# Patient Record
Sex: Male | Born: 1945 | Race: Black or African American | Hispanic: No | Marital: Married | Smoking: Current every day smoker
Health system: Southern US, Community
[De-identification: ages and names within clinical notes are randomized; demographics above are authoritative.]

## PROBLEM LIST (undated history)

## (undated) DIAGNOSIS — E119 Type 2 diabetes mellitus without complications: Secondary | ICD-10-CM

## (undated) DIAGNOSIS — I10 Essential (primary) hypertension: Secondary | ICD-10-CM

## (undated) DIAGNOSIS — F0390 Unspecified dementia without behavioral disturbance: Secondary | ICD-10-CM

## (undated) DIAGNOSIS — Z77098 Contact with and (suspected) exposure to other hazardous, chiefly nonmedicinal, chemicals: Secondary | ICD-10-CM

## (undated) HISTORY — PX: LUNG REMOVAL, PARTIAL: SHX233

## (undated) HISTORY — PX: TOE AMPUTATION: SHX809

---

## 2021-04-05 ENCOUNTER — Inpatient Hospital Stay (HOSPITAL_COMMUNITY)
Admission: EM | Admit: 2021-04-05 | Discharge: 2021-04-08 | DRG: 064 | Disposition: A | Discharge status: Home Health | Payer: No Typology Code available for payment source | Attending: Family Medicine | Admitting: Family Medicine

## 2021-04-05 ENCOUNTER — Emergency Department (HOSPITAL_COMMUNITY): Payer: No Typology Code available for payment source

## 2021-04-05 ENCOUNTER — Encounter (HOSPITAL_COMMUNITY): Payer: Self-pay

## 2021-04-05 ENCOUNTER — Other Ambulatory Visit: Payer: Self-pay

## 2021-04-05 DIAGNOSIS — E1165 Type 2 diabetes mellitus with hyperglycemia: Secondary | ICD-10-CM | POA: Diagnosis present

## 2021-04-05 DIAGNOSIS — IMO0002 Reserved for concepts with insufficient information to code with codable children: Secondary | ICD-10-CM | POA: Diagnosis present

## 2021-04-05 DIAGNOSIS — F1011 Alcohol abuse, in remission: Secondary | ICD-10-CM | POA: Diagnosis present

## 2021-04-05 DIAGNOSIS — Z8249 Family history of ischemic heart disease and other diseases of the circulatory system: Secondary | ICD-10-CM

## 2021-04-05 DIAGNOSIS — I10 Essential (primary) hypertension: Secondary | ICD-10-CM | POA: Diagnosis present

## 2021-04-05 DIAGNOSIS — R739 Hyperglycemia, unspecified: Secondary | ICD-10-CM

## 2021-04-05 DIAGNOSIS — I6340 Cerebral infarction due to embolism of unspecified cerebral artery: Principal | ICD-10-CM | POA: Diagnosis present

## 2021-04-05 DIAGNOSIS — F0280 Dementia in other diseases classified elsewhere without behavioral disturbance: Secondary | ICD-10-CM | POA: Diagnosis present

## 2021-04-05 DIAGNOSIS — Z902 Acquired absence of lung [part of]: Secondary | ICD-10-CM

## 2021-04-05 DIAGNOSIS — I513 Intracardiac thrombosis, not elsewhere classified: Secondary | ICD-10-CM | POA: Diagnosis present

## 2021-04-05 DIAGNOSIS — R4182 Altered mental status, unspecified: Secondary | ICD-10-CM

## 2021-04-05 DIAGNOSIS — G9341 Metabolic encephalopathy: Secondary | ICD-10-CM | POA: Diagnosis present

## 2021-04-05 DIAGNOSIS — D649 Anemia, unspecified: Secondary | ICD-10-CM | POA: Diagnosis present

## 2021-04-05 DIAGNOSIS — Z7984 Long term (current) use of oral hypoglycemic drugs: Secondary | ICD-10-CM

## 2021-04-05 DIAGNOSIS — N179 Acute kidney failure, unspecified: Secondary | ICD-10-CM | POA: Diagnosis present

## 2021-04-05 DIAGNOSIS — I639 Cerebral infarction, unspecified: Secondary | ICD-10-CM | POA: Diagnosis present

## 2021-04-05 DIAGNOSIS — N189 Chronic kidney disease, unspecified: Secondary | ICD-10-CM | POA: Diagnosis present

## 2021-04-05 DIAGNOSIS — F1027 Alcohol dependence with alcohol-induced persisting dementia: Secondary | ICD-10-CM | POA: Diagnosis present

## 2021-04-05 DIAGNOSIS — F17210 Nicotine dependence, cigarettes, uncomplicated: Secondary | ICD-10-CM | POA: Diagnosis present

## 2021-04-05 DIAGNOSIS — D696 Thrombocytopenia, unspecified: Secondary | ICD-10-CM | POA: Diagnosis present

## 2021-04-05 DIAGNOSIS — I129 Hypertensive chronic kidney disease with stage 1 through stage 4 chronic kidney disease, or unspecified chronic kidney disease: Secondary | ICD-10-CM | POA: Diagnosis present

## 2021-04-05 DIAGNOSIS — R55 Syncope and collapse: Secondary | ICD-10-CM

## 2021-04-05 DIAGNOSIS — Z20822 Contact with and (suspected) exposure to covid-19: Secondary | ICD-10-CM | POA: Diagnosis present

## 2021-04-05 DIAGNOSIS — Z79899 Other long term (current) drug therapy: Secondary | ICD-10-CM

## 2021-04-05 DIAGNOSIS — Z720 Tobacco use: Secondary | ICD-10-CM | POA: Diagnosis present

## 2021-04-05 DIAGNOSIS — E1122 Type 2 diabetes mellitus with diabetic chronic kidney disease: Secondary | ICD-10-CM | POA: Diagnosis present

## 2021-04-05 DIAGNOSIS — R1310 Dysphagia, unspecified: Secondary | ICD-10-CM | POA: Diagnosis present

## 2021-04-05 DIAGNOSIS — F0390 Unspecified dementia without behavioral disturbance: Secondary | ICD-10-CM | POA: Diagnosis present

## 2021-04-05 HISTORY — DX: Essential (primary) hypertension: I10

## 2021-04-05 HISTORY — DX: Unspecified dementia, unspecified severity, without behavioral disturbance, psychotic disturbance, mood disturbance, and anxiety: F03.90

## 2021-04-05 HISTORY — DX: Type 2 diabetes mellitus without complications: E11.9

## 2021-04-05 HISTORY — DX: Contact with and (suspected) exposure to other hazardous, chiefly nonmedicinal, chemicals: Z77.098

## 2021-04-05 LAB — CBG MONITORING, ED
Glucose-Capillary: 539 mg/dL (ref 70–99)
Glucose-Capillary: 417 mg/dL — ABNORMAL HIGH (ref 70–99)
Glucose-Capillary: 337 mg/dL — ABNORMAL HIGH (ref 70–99)
Glucose-Capillary: 83 mg/dL (ref 70–99)

## 2021-04-05 LAB — CBC
HCT: 34.5 % — ABNORMAL LOW (ref 39.0–52.0)
Hemoglobin: 12 g/dL — ABNORMAL LOW (ref 13.0–17.0)
MCH: 33.5 pg (ref 26.0–34.0)
MCHC: 34.8 g/dL (ref 30.0–36.0)
MCV: 96.4 fL (ref 80.0–100.0)
Platelets: 221 10*3/uL (ref 150–400)
RBC: 3.58 MIL/uL — ABNORMAL LOW (ref 4.22–5.81)
RDW: 12.2 % (ref 11.5–15.5)
WBC: 10.3 10*3/uL (ref 4.0–10.5)
nRBC: 0 % (ref 0.0–0.2)

## 2021-04-05 LAB — COMPREHENSIVE METABOLIC PANEL
ALT: 23 U/L (ref 0–44)
AST: 17 U/L (ref 15–41)
Albumin: 4.4 g/dL (ref 3.5–5.0)
Alkaline Phosphatase: 67 U/L (ref 38–126)
Anion gap: 14 (ref 5–15)
BUN: 31 mg/dL — ABNORMAL HIGH (ref 8–23)
CO2: 25 mmol/L (ref 22–32)
Calcium: 9.7 mg/dL (ref 8.9–10.3)
Chloride: 94 mmol/L — ABNORMAL LOW (ref 98–111)
Creatinine, Ser: 2.19 mg/dL — ABNORMAL HIGH (ref 0.61–1.24)
GFR, Estimated: 31 mL/min — ABNORMAL LOW (ref >60)
Glucose, Bld: 584 mg/dL (ref 70–99)
Potassium: 4.6 mmol/L (ref 3.5–5.1)
Sodium: 133 mmol/L — ABNORMAL LOW (ref 135–145)
Total Bilirubin: 1.2 mg/dL (ref 0.3–1.2)
Total Protein: 7.9 g/dL (ref 6.5–8.1)

## 2021-04-05 LAB — HEMOGLOBIN A1C
Hgb A1c MFr Bld: 12.1 % — ABNORMAL HIGH (ref 4.8–5.6)
Mean Plasma Glucose: 300.57 mg/dL

## 2021-04-05 LAB — RESP PANEL BY RT-PCR (FLU A&B, COVID) ARPGX2
Influenza A by PCR: NEGATIVE
Influenza B by PCR: NEGATIVE
SARS Coronavirus 2 by RT PCR: NEGATIVE

## 2021-04-05 LAB — BETA-HYDROXYBUTYRIC ACID: Beta-Hydroxybutyric Acid: 2.52 mmol/L — ABNORMAL HIGH (ref 0.05–0.27)

## 2021-04-05 LAB — ETHANOL: Alcohol, Ethyl (B): <10 mg/dL (ref <10)

## 2021-04-05 LAB — GLUCOSE, CAPILLARY
Glucose-Capillary: 66 mg/dL — ABNORMAL LOW (ref 70–99)
Glucose-Capillary: 118 mg/dL — ABNORMAL HIGH (ref 70–99)

## 2021-04-05 MED ORDER — LACTATED RINGERS IV SOLN: INTRAVENOUS | Status: DC

## 2021-04-05 MED ORDER — SODIUM CHLORIDE 0.9% FLUSH
3.0000 mL | Freq: Two times a day (BID) | INTRAVENOUS | Status: DC
  Administered 2021-04-05 – 2021-04-07 (×5): 3 mL via INTRAVENOUS

## 2021-04-05 MED ORDER — ONDANSETRON HCL 4 MG PO TABS: 4.0000 mg | ORAL_TABLET | Freq: Four times a day (QID) | ORAL | Status: DC | PRN

## 2021-04-05 MED ORDER — SODIUM CHLORIDE 0.9 % IV BOLUS
1000.0000 mL | Freq: Once | INTRAVENOUS | Status: AC
  Administered 2021-04-05: 1000 mL via INTRAVENOUS

## 2021-04-05 MED ORDER — SODIUM CHLORIDE 0.9% FLUSH
3.0000 mL | Freq: Two times a day (BID) | INTRAVENOUS | Status: DC
  Administered 2021-04-05 – 2021-04-08 (×5): 3 mL via INTRAVENOUS

## 2021-04-05 MED ORDER — BISACODYL 10 MG RE SUPP: 10.0000 mg | Freq: Every day | RECTAL | Status: DC | PRN

## 2021-04-05 MED ORDER — LABETALOL HCL 5 MG/ML IV SOLN
20.0000 mg | Freq: Once | INTRAVENOUS | Status: AC
  Administered 2021-04-05: 20 mg via INTRAVENOUS
  Filled 2021-04-05: qty 4

## 2021-04-05 MED ORDER — QUETIAPINE FUMARATE 25 MG PO TABS: 50.0000 mg | ORAL_TABLET | Freq: Every day | ORAL | Status: DC

## 2021-04-05 MED ORDER — ATENOLOL 25 MG PO TABS
50.0000 mg | ORAL_TABLET | Freq: Every day | ORAL | Status: DC
  Administered 2021-04-06: 50 mg via ORAL
  Filled 2021-04-05: qty 2

## 2021-04-05 MED ORDER — INSULIN ASPART 100 UNIT/ML IJ SOLN: 3.0000 [IU] | Freq: Three times a day (TID) | INTRAMUSCULAR | Status: DC

## 2021-04-05 MED ORDER — SODIUM CHLORIDE 0.9 % IV SOLN: INTRAVENOUS | Status: DC

## 2021-04-05 MED ORDER — INSULIN GLARGINE-YFGN 100 UNIT/ML ~~LOC~~ SOLN
15.0000 [IU] | Freq: Every day | SUBCUTANEOUS | Status: DC
  Administered 2021-04-05: 15 [IU] via SUBCUTANEOUS
  Filled 2021-04-05 (×2): qty 0.15

## 2021-04-05 MED ORDER — LIVING WELL WITH DIABETES BOOK
Freq: Once | Status: DC
  Filled 2021-04-05: qty 1

## 2021-04-05 MED ORDER — ACETAMINOPHEN 650 MG RE SUPP: 650.0000 mg | Freq: Four times a day (QID) | RECTAL | Status: DC | PRN

## 2021-04-05 MED ORDER — QUETIAPINE FUMARATE 25 MG PO TABS
25.0000 mg | ORAL_TABLET | Freq: Every day | ORAL | Status: DC
  Administered 2021-04-06 – 2021-04-07 (×2): 25 mg via ORAL
  Filled 2021-04-05 (×3): qty 1

## 2021-04-05 MED ORDER — ASPIRIN EC 81 MG PO TBEC
81.0000 mg | DELAYED_RELEASE_TABLET | Freq: Every day | ORAL | Status: DC
  Administered 2021-04-06: 81 mg via ORAL
  Filled 2021-04-05 (×2): qty 1

## 2021-04-05 MED ORDER — THIAMINE HCL 100 MG PO TABS
100.0000 mg | ORAL_TABLET | Freq: Every day | ORAL | Status: DC
  Administered 2021-04-06 – 2021-04-08 (×2): 100 mg via ORAL
  Filled 2021-04-05 (×4): qty 1

## 2021-04-05 MED ORDER — INSULIN ASPART 100 UNIT/ML IJ SOLN: 0.0000 [IU] | Freq: Every day | INTRAMUSCULAR | Status: DC

## 2021-04-05 MED ORDER — INSULIN REGULAR(HUMAN) IN NACL 100-0.9 UT/100ML-% IV SOLN: INTRAVENOUS | Status: DC

## 2021-04-05 MED ORDER — DEXTROSE 50 % IV SOLN
0.0000 mL | INTRAVENOUS | Status: DC | PRN
  Administered 2021-04-05 – 2021-04-06 (×2): 50 mL via INTRAVENOUS
  Filled 2021-04-05 (×3): qty 50

## 2021-04-05 MED ORDER — ACETAMINOPHEN 325 MG PO TABS: 650.0000 mg | ORAL_TABLET | Freq: Four times a day (QID) | ORAL | Status: DC | PRN

## 2021-04-05 MED ORDER — ASPIRIN 300 MG RE SUPP
300.0000 mg | Freq: Once | RECTAL | Status: AC
  Administered 2021-04-05: 300 mg via RECTAL
  Filled 2021-04-05: qty 1

## 2021-04-05 MED ORDER — ATORVASTATIN CALCIUM 40 MG PO TABS
40.0000 mg | ORAL_TABLET | Freq: Every day | ORAL | Status: DC
  Administered 2021-04-06 – 2021-04-08 (×2): 40 mg via ORAL
  Filled 2021-04-05 (×3): qty 1

## 2021-04-05 MED ORDER — SODIUM CHLORIDE 0.9% FLUSH: 3.0000 mL | INTRAVENOUS | Status: DC | PRN

## 2021-04-05 MED ORDER — INSULIN GLARGINE-YFGN 100 UNIT/ML ~~LOC~~ SOLN
12.0000 [IU] | Freq: Every day | SUBCUTANEOUS | Status: DC
  Filled 2021-04-05: qty 0.12

## 2021-04-05 MED ORDER — SODIUM CHLORIDE 0.9 % IV SOLN: 250.0000 mL | INTRAVENOUS | Status: DC | PRN

## 2021-04-05 MED ORDER — NICOTINE 14 MG/24HR TD PT24
14.0000 mg | MEDICATED_PATCH | Freq: Every day | TRANSDERMAL | Status: DC
  Administered 2021-04-05 – 2021-04-08 (×4): 14 mg via TRANSDERMAL
  Filled 2021-04-05 (×4): qty 1

## 2021-04-05 MED ORDER — AMLODIPINE BESYLATE 5 MG PO TABS
10.0000 mg | ORAL_TABLET | Freq: Every day | ORAL | Status: DC
  Administered 2021-04-06: 10 mg via ORAL
  Filled 2021-04-05: qty 2

## 2021-04-05 MED ORDER — DEXTROSE IN LACTATED RINGERS 5 % IV SOLN: INTRAVENOUS | Status: DC

## 2021-04-05 MED ORDER — LABETALOL HCL 5 MG/ML IV SOLN
10.0000 mg | INTRAVENOUS | Status: DC | PRN
  Filled 2021-04-05: qty 4

## 2021-04-05 MED ORDER — DONEPEZIL HCL 5 MG PO TABS
5.0000 mg | ORAL_TABLET | Freq: Every day | ORAL | Status: DC
  Administered 2021-04-06 – 2021-04-07 (×2): 5 mg via ORAL
  Filled 2021-04-05 (×3): qty 1

## 2021-04-05 MED ORDER — HEPARIN SODIUM (PORCINE) 5000 UNIT/ML IJ SOLN
5000.0000 [IU] | Freq: Three times a day (TID) | INTRAMUSCULAR | Status: DC
  Administered 2021-04-05 – 2021-04-07 (×5): 5000 [IU] via SUBCUTANEOUS
  Filled 2021-04-05 (×5): qty 1

## 2021-04-05 MED ORDER — TRAZODONE HCL 50 MG PO TABS: 50.0000 mg | ORAL_TABLET | Freq: Every evening | ORAL | Status: DC | PRN

## 2021-04-05 MED ORDER — ADULT MULTIVITAMIN W/MINERALS CH
1.0000 | ORAL_TABLET | Freq: Every day | ORAL | Status: DC
  Administered 2021-04-06 – 2021-04-08 (×2): 1 via ORAL
  Filled 2021-04-05 (×5): qty 1

## 2021-04-05 MED ORDER — INSULIN ASPART 100 UNIT/ML IJ SOLN
0.0000 [IU] | Freq: Three times a day (TID) | INTRAMUSCULAR | Status: DC
  Administered 2021-04-05: 15 [IU] via SUBCUTANEOUS
  Administered 2021-04-06 – 2021-04-07 (×2): 3 [IU] via SUBCUTANEOUS
  Filled 2021-04-05: qty 1

## 2021-04-05 MED ORDER — POLYETHYLENE GLYCOL 3350 17 G PO PACK: 17.0000 g | PACK | Freq: Every day | ORAL | Status: DC | PRN

## 2021-04-05 MED ORDER — INSULIN STARTER KIT- PEN NEEDLES (ENGLISH)
1.0000 | Freq: Once | Status: DC
  Filled 2021-04-05 (×2): qty 1

## 2021-04-05 MED ORDER — HYDROXYZINE HCL 10 MG PO TABS: 10.0000 mg | ORAL_TABLET | Freq: Two times a day (BID) | ORAL | Status: DC | PRN

## 2021-04-05 MED ORDER — MIRTAZAPINE 15 MG PO TABS
15.0000 mg | ORAL_TABLET | Freq: Every day | ORAL | Status: DC
  Filled 2021-04-05: qty 1

## 2021-04-05 MED ORDER — FOLIC ACID 1 MG PO TABS
1.0000 mg | ORAL_TABLET | Freq: Every day | ORAL | Status: DC
  Administered 2021-04-06 – 2021-04-08 (×2): 1 mg via ORAL
  Filled 2021-04-05 (×4): qty 1

## 2021-04-05 MED ORDER — ONDANSETRON HCL 4 MG/2ML IJ SOLN: 4.0000 mg | Freq: Four times a day (QID) | INTRAMUSCULAR | Status: DC | PRN

## 2021-04-05 MED ORDER — MEMANTINE HCL 10 MG PO TABS
5.0000 mg | ORAL_TABLET | Freq: Two times a day (BID) | ORAL | Status: DC
  Administered 2021-04-06 – 2021-04-08 (×4): 5 mg via ORAL
  Filled 2021-04-05 (×6): qty 1

## 2021-04-05 NOTE — H&P (Addendum)
Patient Demographics:    Joshua Morales, is a 75 y.o. 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   AKI (acute kidney injury) (HCC)    1) acute metabolic encephalopathy--CT head negative -We will get MRI brain and EEG -Check UDS  2)DM2-uncontrolled DM with hyperglycemia--- blood sugar over 580 on presentation, does not meet criteria for DKA --Chemistry with glucose of 584 with a bicarb of 25, anion gap is 14 -IV fluids and subcu insulin as ordered  3) history of EtOH abuse--- longstanding history of heavy alcohol use -Quit drinking in March 2022 after DUI -Give thiamine folic acid on multivitamin -DTs unlikely given lack of EtOH use recently  4)HTN-continue amlodipine  5) history of alcoholic dementia--- supportive care, Aricept, Namenda and Seroquel as ordered  6)--tobacco abuse--- okay to give nicotine patch  7) dysphagia--- get speech eval, brain MRI pending  8) acute versus chronic anemia -- hemoglobin is 12.0, platelets 221, monitor closely, check stool for occult blood and transfuse as indicated  9)Aki Vs Possible CKD--- --creatinine is 2.19 baseline not available  -Bicarb is 25 avoid Nephrotoxic agents / dehydration  / hypotension   Disposition/Need for in-Hospital Stay- patient unable to be discharged at this time due to -hyperglycemia, acute encephalopathy requiring further work-up*  Dispo: The patient is from: Home               Anticipated d/c is to: Home              Anticipated d/c date is: 1 day              Patient currently is not medically stable to d/c. Barriers: Not Clinically Stable-    With History of - Reviewed by me  Past Medical History:  Diagnosis Date   Agent orange exposure    Dementia (HCC)    Diabetes mellitus without complication (HCC)    Hypertension       Past Surgical History:  Procedure Laterality Date   LUNG REMOVAL, PARTIAL     TOE AMPUTATION        Chief Complaint  Patient presents with   Altered Mental Status      HPI:    Joshua Morales  is a 75 y.o. male with past medical history relevant for uncontrolled DM, hypertension, alcohol abuse, tobacco abuse, alcohol-related dementia and agent orange exposure--presents by EMS with concerns of altered mentation -Patient last known normal around 7 PM -The family usually talkative this morning not really talking much -In the ED no fevers  No Nausea, Vomiting or  Diarrhea -Family denies recent fall or head injury -CT head no acute findings -CBC with white count of 10.3 hemoglobin is 12.0, platelets 221 -Chemistry with glucose of 584 with a bicarb of 25, anion gap is 14 -creatinine is 2.19 baseline not available chloride is 94 and sodium is 134  -Additional history obtained from patient's daughter Joshua Morales    Review of systems:    In addition to the HPI above,   A full Review of  Systems was done, all other systems reviewed are negative except as noted above in HPI , .    Social History:  Reviewed by me    Social History   Tobacco Use   Smoking status: Every Day    Types: Cigarettes   Smokeless tobacco: Not on file  Substance Use Topics   Alcohol use: Yes    Comment: occ     Family History :  Reviewed by me  HTN   Home Medications:   Prior to Admission medications   Medication Sig Start Date End Date Taking? Authorizing Provider  ACIDOPHILUS LACTOBACILLUS PO Take 2 capsules by mouth in the morning  and at bedtime.   Yes [provider]  atenolol (TENORMIN) 50 MG tablet Take 50 mg by mouth daily.   Yes [provider]  atorvastatin (LIPITOR) 40 MG tablet Take 40 mg by mouth daily.   Yes [provider]  Cholecalciferol (D3-1000) 25 MCG (1000 UT) tablet Take 2,000 Units by mouth daily.   Yes [provider]  hydrOXYzine (ATARAX/VISTARIL) 10 MG tablet Take 10 mg by mouth 2 (two) times daily as needed for itching.   Yes [provider]  metFORMIN (GLUCOPHAGE-XR) 500 MG 24 hr tablet Take 1,000 mg by mouth in the morning and at bedtime.   Yes [provider]  metoprolol tartrate (LOPRESSOR) 25 MG tablet Take 25 mg by mouth 2 (two) times daily.   Yes [provider]  mirtazapine (REMERON) 15 MG tablet Take 15 mg by mouth at bedtime.   Yes [provider]     Allergies:    No Known Allergies   Physical Exam:   Vitals  Blood pressure (!) 149/81, pulse 92, temperature 99.8 F (37.7 C), temperature source Oral, resp. rate 13, height 5\' 10"  (1.778 m), weight 56.7 kg, SpO2 100 %.  Physical Examination: General appearance -chronically ill-appearing, and in no distress  Mental status -quiet, not really communicative Eyes - sclera anicteric Neck - supple, no JVD elevation , Chest - clear  to auscultation bilaterally, symmetrical air movement,  Heart - S1 and S2 normal, regular  Abdomen - soft, nontender, nondistended, no masses or organomegaly Neurological -not really verbal at this time, not following commands, neuro exam is limited Extremities - no pedal edema noted, intact peripheral pulses  Skin - warm, dry     Data Review:    CBC Recent Labs  Lab 04/05/21 0938  WBC 10.3  HGB 12.0*  HCT 34.5*  PLT 221  MCV 96.4  MCH 33.5  MCHC 34.8  RDW 12.2   ------------------------------------------------------------------------------------------------------------------  Chemistries  Recent Labs  Lab  04/05/21 0938  NA 133*  K 4.6  CL 94*  CO2 25  GLUCOSE 584*  BUN 31*  CREATININE 2.19*  CALCIUM 9.7  AST 17  ALT 23  ALKPHOS 67  BILITOT 1.2   ------------------------------------------------------------------------------------------------------------------ estimated creatinine clearance is 24.1 mL/min (A) (by C-G formula based on SCr of 2.19 mg/dL (H)). ------------------------------------------------------------------------------------------------------------------ No results for input(s): TSH, T4TOTAL, T3FREE,  THYROIDAB in the last 72 hours.  Invalid input(s): FREET3   Coagulation profile No results for input(s): INR, PROTIME in the last 168 hours. ------------------------------------------------------------------------------------------------------------------- No results for input(s): DDIMER in the last 72 hours. -------------------------------------------------------------------------------------------------------------------  Cardiac Enzymes No results for input(s): CKMB, TROPONINI, MYOGLOBIN in the last 168 hours.  Invalid input(s): CK ------------------------------------------------------------------------------------------------------------------ No results found for: BNP   ---------------------------------------------------------------------------------------------------------------  Urinalysis No results found for: COLORURINE, APPEARANCEUR, LABSPEC, PHURINE, GLUCOSEU, HGBUR, BILIRUBINUR, KETONESUR, PROTEINUR, UROBILINOGEN, NITRITE, LEUKOCYTESUR  ----------------------------------------------------------------------------------------------------------------   Imaging Results:    CT HEAD WO CONTRAST  Result Date: 04/05/2021 CLINICAL DATA:  Mental status change, unknown cause. Awoke today with difficulty speaking. Dementia EXAM: CT HEAD WITHOUT CONTRAST TECHNIQUE: Contiguous axial images were obtained from the base of the skull through the vertex without  intravenous contrast. COMPARISON:  None. FINDINGS: Brain: Generalized atrophy. There may be some frontal and temporal predominance. Chronic small-vessel ischemic change of the hemispheric white matter. No sign of acute infarction, mass lesion, hemorrhage, hydrocephalus or extra-axial collection. Vascular: There is atherosclerotic calcification of the major vessels at the base of the brain. Skull: Negative Sinuses/Orbits: Clear/normal Other: None IMPRESSION: No acute brain finding. Brain atrophy, possibly with some frontal and temporal predominance. Chronic small-vessel change of the hemispheric white matter. Electronically Signed   By: Paulina Fusi M.D.   On: 04/05/2021 11:03   DG Chest Port 1 View  Result Date: 04/05/2021 CLINICAL DATA:  Weakness EXAM: PORTABLE CHEST 1 VIEW COMPARISON:  None. FINDINGS: Heart and mediastinal contours are within normal limits. No focal opacities or effusions. No acute bony abnormality. IMPRESSION: No active disease. Electronically Signed   By: Charlett Nose M.D.   On: 04/05/2021 10:00    Radiological Exams on Admission: CT HEAD WO CONTRAST  Result Date: 04/05/2021 CLINICAL DATA:  Mental status change, unknown cause. Awoke today with difficulty speaking. Dementia EXAM: CT HEAD WITHOUT CONTRAST TECHNIQUE: Contiguous axial images were obtained from the base of the skull through the vertex without intravenous contrast. COMPARISON:  None. FINDINGS: Brain: Generalized atrophy. There may be some frontal and temporal predominance. Chronic small-vessel ischemic change of the hemispheric white matter. No sign of acute infarction, mass lesion, hemorrhage, hydrocephalus or extra-axial collection. Vascular: There is atherosclerotic calcification of the major vessels at the base of the brain. Skull: Negative Sinuses/Orbits: Clear/normal Other: None IMPRESSION: No acute brain finding. Brain atrophy, possibly with some frontal and temporal predominance. Chronic small-vessel change of the  hemispheric white matter. Electronically Signed   By: Paulina Fusi M.D.   On: 04/05/2021 11:03   DG Chest Port 1 View  Result Date: 04/05/2021 CLINICAL DATA:  Weakness EXAM: PORTABLE CHEST 1 VIEW COMPARISON:  None. FINDINGS: Heart and mediastinal contours are within normal limits. No focal opacities or effusions. No acute bony abnormality. IMPRESSION: No active disease. Electronically Signed   By: Charlett Nose M.D.   On: 04/05/2021 10:00    DVT Prophylaxis -SCD /heparin AM Labs Ordered, also please review Full Orders  Family Communication: Admission, patients condition and plan of care including tests being ordered have been discussed with the patient and daughter Joshua Morales* who indicate understanding and agree with the plan   Code Status - Full Code  Likely DC to home after resolution of acute metabolic encephalopathy  Condition   stable  Shon Hale M.D on 04/05/2021 at 7:08 PM Go to [email or web address] -  for contact info  [facility] - Office  [phone]

## 2021-04-05 NOTE — ED Provider Notes (Signed)
[facility] EMERGENCY DEPARTMENT Provider Note   CSN: [id number] Arrival date & time: 04/05/21  3244     History Chief Complaint  Patient presents with   Altered Mental Status    Joshua Morales is a 75 y.o. male.  Patient with hx htn, dm, dementia, presents with altered mental status. Was last seen at his recent baseline around 7 pm last night, this AM seemed generally weak, not responding verbally, CBG 584. Pt not verbally responsive to questions - level 5 caveat. Report of fall last week, and that imaging ok then, no new or recent fall since. No report of fevers. Family denies recent specific physical c/o or c/o of pain. Has been eating/drinking relatively little.   The history is provided by the patient, a relative, medical records and the EMS personnel. The history is limited by the condition of the patient.  Altered Mental Status     Past Medical History:  Diagnosis Date   Agent orange exposure    Dementia (HCC)    Diabetes mellitus without complication (HCC)    Hypertension     There are no problems to display for this patient.   Past Surgical History:  Procedure Laterality Date   LUNG REMOVAL, PARTIAL     TOE AMPUTATION         No family history on file.  Social History   Tobacco Use   Smoking status: Every Day    Types: Cigarettes  Substance Use Topics   Alcohol use: Yes    Comment: occ   Drug use: Never    Home Medications Prior to Admission medications   Not on File    Allergies    Patient has no known allergies.  Review of Systems   Review of Systems  Unable to perform ROS: Patient nonverbal  Level 5 caveat  - pt not verbally responsive   Physical Exam Updated Vital Signs BP (!) 195/103 (BP Location: Left Arm)   Pulse (!) 112   Temp 99.6 F (37.6 C) (Oral)   Resp 16   Ht 1.778 m (5\' 10" )   Wt 56.7 kg   SpO2 97%   BMI 17.94 kg/m   Physical Exam Vitals and nursing note reviewed.  Constitutional:      Appearance: Normal  appearance. He is well-developed.  HENT:     Head: Atraumatic.     Nose: Nose normal.     Mouth/Throat:     Mouth: Mucous membranes are moist.     Pharynx: Oropharynx is clear.  Eyes:     General: No scleral icterus.    Conjunctiva/sclera: Conjunctivae normal.     Pupils: Pupils are equal, round, and reactive to light.  Neck:     Vascular: No carotid bruit.     Trachea: No tracheal deviation.     Comments: No stiffness or rigidity. Trachea midline. Thyroid not grossly enlarged or tender.  Cardiovascular:     Rate and Rhythm: Regular rhythm. Tachycardia present.     Pulses: Normal pulses.     Heart sounds: Normal heart sounds. No murmur heard.   No friction rub. No gallop.  Pulmonary:     Effort: Pulmonary effort is normal. No accessory muscle usage or respiratory distress.     Breath sounds: Normal breath sounds.  Abdominal:     General: Bowel sounds are normal. There is no distension.     Palpations: Abdomen is soft.     Tenderness: There is no abdominal tenderness. There is no guarding.  Genitourinary:    Comments: No cva tenderness. Musculoskeletal:        General: No swelling or tenderness.     Cervical back: Normal range of motion and neck supple. No rigidity or tenderness.     Right lower leg: No edema.     Left lower leg: No edema.  Lymphadenopathy:     Cervical: No cervical adenopathy.  Skin:    General: Skin is warm and dry.     Findings: No rash.  Neurological:     Mental Status: He is alert.     Comments: Opens eyes to command. Moves extremities purposefully but not following commands.   Psychiatric:     Comments: Withdrawn appearing, slow to respond.     ED Results / Procedures / Treatments   Labs (all labs ordered are listed, but only abnormal results are displayed) Results for orders placed or performed during the hospital encounter of 04/05/21  CBC  Result Value Ref Range   WBC 10.3 4.0 - 10.5 K/uL   RBC 3.58 (L) 4.22 - 5.81 MIL/uL   Hemoglobin  12.0 (L) 13.0 - 17.0 g/dL   HCT 93.9 (L) 03.0 - 09.2 %   MCV 96.4 80.0 - 100.0 fL   MCH 33.5 26.0 - 34.0 pg   MCHC 34.8 30.0 - 36.0 g/dL   RDW 33.0 07.6 - 22.6 %   Platelets 221 150 - 400 K/uL   nRBC 0.0 0.0 - 0.2 %  Comprehensive metabolic panel  Result Value Ref Range   Sodium 133 (L) 135 - 145 mmol/L   Potassium 4.6 3.5 - 5.1 mmol/L   Chloride 94 (L) 98 - 111 mmol/L   CO2 25 22 - 32 mmol/L   Glucose, Bld 584 (HH) 70 - 99 mg/dL   BUN 31 (H) 8 - 23 mg/dL   Creatinine, Ser 3.33 (H) 0.61 - 1.24 mg/dL   Calcium 9.7 8.9 - 54.5 mg/dL   Total Protein 7.9 6.5 - 8.1 g/dL   Albumin 4.4 3.5 - 5.0 g/dL   AST 17 15 - 41 U/L   ALT 23 0 - 44 U/L   Alkaline Phosphatase 67 38 - 126 U/L   Total Bilirubin 1.2 0.3 - 1.2 mg/dL   GFR, Estimated 31 (L) >60 mL/min   Anion gap 14 5 - 15  Ethanol  Result Value Ref Range   Alcohol, Ethyl (B) <10 <10 mg/dL  Beta-hydroxybutyric acid  Result Value Ref Range   Beta-Hydroxybutyric Acid 2.52 (H) 0.05 - 0.27 mmol/L  CBG monitoring, ED  Result Value Ref Range   Glucose-Capillary 539 (HH) 70 - 99 mg/dL   Comment 1 Notify RN    DG Chest Port 1 View  Result Date: 04/05/2021 CLINICAL DATA:  Weakness EXAM: PORTABLE CHEST 1 VIEW COMPARISON:  None. FINDINGS: Heart and mediastinal contours are within normal limits. No focal opacities or effusions. No acute bony abnormality. IMPRESSION: No active disease. Electronically Signed   By: Charlett Nose M.D.   On: 04/05/2021 10:00    EKG EKG Interpretation  Date/Time:  Monday April 05 2021 09:16:36 EDT Ventricular Rate:  113 PR Interval:    QRS Duration: 206 QT Interval:  361 QTC Calculation: 495 R Axis:   -25 Text Interpretation: Sinus tachycardia Baseline wander Poor data quality Confirmed by Cathren Laine ([id number]) on 04/05/2021 9:36:50 AM  Radiology CT HEAD WO CONTRAST  Result Date: 04/05/2021 CLINICAL DATA:  Mental status change, unknown cause. Awoke today with difficulty speaking. Dementia EXAM: CT HEAD  WITHOUT CONTRAST TECHNIQUE: Contiguous axial images were obtained from the base of the skull through the vertex without intravenous contrast. COMPARISON:  None. FINDINGS: Brain: Generalized atrophy. There may be some frontal and temporal predominance. Chronic small-vessel ischemic change of the hemispheric white matter. No sign of acute infarction, mass lesion, hemorrhage, hydrocephalus or extra-axial collection. Vascular: There is atherosclerotic calcification of the major vessels at the base of the brain. Skull: Negative Sinuses/Orbits: Clear/normal Other: None IMPRESSION: No acute brain finding. Brain atrophy, possibly with some frontal and temporal predominance. Chronic small-vessel change of the hemispheric white matter. Electronically Signed   By: Paulina Fusi M.D.   On: 04/05/2021 11:03   DG Chest Port 1 View  Result Date: 04/05/2021 CLINICAL DATA:  Weakness EXAM: PORTABLE CHEST 1 VIEW COMPARISON:  None. FINDINGS: Heart and mediastinal contours are within normal limits. No focal opacities or effusions. No acute bony abnormality. IMPRESSION: No active disease. Electronically Signed   By: Charlett Nose M.D.   On: 04/05/2021 10:00    Procedures Procedures   Medications Ordered in ED Medications  sodium chloride 0.9 % bolus 1,000 mL (has no administration in time range)    ED Course  I have reviewed the triage vital signs and the nursing notes.  Pertinent labs & imaging results that were available during my care of the patient were reviewed by me and considered in my medical decision making (see chart for details).    MDM Rules/Calculators/A&P                           CBG high 584, continuous pulse ox and cardiac monitoring. Stat labs.   Reviewed nursing notes and prior charts for additional history.   Labs reviewed/interpreted by me - glucose severely high.  HCO3 is normal. Iv ns boluses. Insulin gtt via hyperglycemic crisis order set.   CT reviewed/interpreted by me - no  hem..  CXR reviewed/interpreted by me - no pna.   Hospitalists consulted for admission.  CRITICAL CARE RE: acute alteration in mental status, severe hyperglycemia, AKI, insulin gtt, severe/uncontrolled htn.  Performed by: Suzi Roots Total critical care time: 115 minutes Critical care time was exclusive of separately billable procedures and treating other patients. Critical care was necessary to treat or prevent imminent or life-threatening deterioration. Critical care was time spent personally by me on the following activities: development of treatment plan with patient and/or surrogate as well as nursing, discussions with consultants, evaluation of patient's response to treatment, examination of patient, obtaining history from patient or surrogate, ordering and performing treatments and interventions, ordering and review of laboratory studies, ordering and review of radiographic studies, pulse oximetry and re-evaluation of patient's condition.  Neurochecks/frequent rechecks - pt slightly more alert/responsive than initial.   Recheck bp - still high. Labetalol iv.     Final Clinical Impression(s) / ED Diagnoses Final diagnoses:  None    Rx / DC Orders ED Discharge Orders     None        Cathren Laine, MD 04/05/21 1110

## 2021-04-05 NOTE — ED Triage Notes (Signed)
[facility] em[facility] reports family told them he went to bed around 7pm last night and was normal.  Reports pt woke up at 5 am and sat on the side of the bed but would not verbally respond.  Pt alert.  EMS says cbg was 584, ST on monitor per ems, hr 112, bp 198/112, 100% o2 sat on RA, RR 22, temp 98.5 axillary.  Daughter said pt had similar episode last week.  Reports went to [facility] and sugar was high then as well.  Reports pt has dementia and says it is progressively getting worse.  Daughter reports pt may have some depression.  She denies hearing him say any suicidal comments.  Daughter says pt usually verbal.  Pt's eyes open but not following commands. Daughter at bedside.

## 2021-04-05 NOTE — ED Notes (Signed)
Took blood sugar it was 539. Notified RN

## 2021-04-05 NOTE — Progress Notes (Signed)
Inpatient Diabetes Program Recommendations  AACE/ADA: New Consensus Statement on Inpatient Glycemic Control   Target Ranges:  Prepandial:   less than 140 mg/dL      Peak postprandial:   less than 180 mg/dL (1-2 hours)      Critically ill patients:  140 - 180 mg/dL   Results for Joshua Morales, Joshua Morales (MRN [id number]) as of 04/05/2021 11:52  Ref. Range 04/05/2021 10:59  Glucose-Capillary Latest Ref Range: 70 - 99 mg/dL 240 [facility])   Results for Joshua Morales, Joshua Morales (MRN [id number]) as of 04/05/2021 11:52  Ref. Range 04/05/2021 09:38  Beta-Hydroxybutyric Acid Latest Ref Range: 0.05 - 0.27 mmol/L 2.52 (H)  Glucose Latest Ref Range: 70 - 99 mg/dL 426 (HH)   Review of Glycemic Control  Diabetes history: DM2 Outpatient Diabetes medications: Alogliptin, Metformin, Glipizide Current orders for Inpatient glycemic control: Semglee 15 units daily, Novolog 0-15 units TID with meals, Novolog 0-5 units QHS, Novolog 3 units TID with meals  Inpatient Diabetes Program Recommendations:    Insulin: Agree with current insulin orders.  HbgA1C: Current A1C ordered. Patient's daughter reports that his A1C was 7% in March or last year.  NOTE: Noted consult for hyperglycemia. Diabetes coordinator working remotely. Per chart, only has Metformin 1000 mg BID listed as outpatient DM medications. Patient noted to have episode this morning where he would not verbally respond with noted hyperglycemia; per family patient had similar episode last week and went to [place]. Patient noted to have dementia which is progressively getting worse per family. Initial glucose 584 mg/dl on 04/07/40. Called Joshua Morales (patient's daughter; [phone]) and spoke with her over the phone regarding her father. She states that patient is currently taking Metformin, Aloglipitin, and Glipizide for DM but she notes that he has not been consistently getting medications due to increased agitation and not wanting to take his medications. She reports that  family has been checking his glucose at home and it has been reading "HI" on glucometer for past week. She confirms that patient went to [place] last week and had hyperglycemia at that time but no changes were made with DM medications but notes that he was given insulin at [facility] for hyperglycemia. She also noted that patient went to [facility] appointment for follow up in March and was sent to [facility] for possible CVA and was also noted to have hyperglycemia at that time. Inquired about whether patient has any prior use of insulin and she states that he use to take insulin but he was not taking it correctly and was having hypoglycemia (glucose down in the 30's) so it was stopped about 1 year ago and he was prescribed oral DM medications. She does not know what the name of the insulin was that he was taking in the past.  Discussed initial glucose of 584 mg/dl and current Q1J ordered. She noted that patient's last A1C was 7% at [place] in March or last year.  Discussed current insulin orders and explained that glucose trends would be followed and additional changes made if needed. Explained that patient may need to be discharged on insulin and inquired if patient would have family to administer insulin. She states that family is with patient around the clock and would be willing to administer insulin if needed. Family would need education on insulin administration as they have never administered insulin to patient.  Explained that bedside nursing will be asked to work with family on insulin administration and allow family to administer insulin while inpatient if  they are at bedside during times when insulin is ordered. She verbalized understanding of information discussed and states that she has no questions at this time related to DM.    Thanks, [name], RN, MSN, CDE Diabetes Coordinator Inpatient Diabetes Program [phone] (Team Pager from 8am to 5pm)

## 2021-04-05 NOTE — Progress Notes (Signed)
Patient is having trouble swallowing and unable to take in any intake PO. His daughter is at bedside and attempted to give him small bite of applesauce, pt was unable to swallow it. This nurse held all oral bedtime meds d/t situation. Caswell Corwin MD notified and aware.

## 2021-04-05 NOTE — ED Notes (Signed)
P[place]y alarm placed on pt.  ?

## 2021-04-06 ENCOUNTER — Observation Stay (HOSPITAL_COMMUNITY): Payer: No Typology Code available for payment source

## 2021-04-06 ENCOUNTER — Observation Stay (HOSPITAL_COMMUNITY)
Admit: 2021-04-06 | Discharge: 2021-04-06 | Disposition: A | Discharge status: Home / Self Care | Payer: No Typology Code available for payment source | Attending: Family Medicine | Admitting: Family Medicine

## 2021-04-06 ENCOUNTER — Inpatient Hospital Stay (HOSPITAL_COMMUNITY): Payer: No Typology Code available for payment source

## 2021-04-06 DIAGNOSIS — F0390 Unspecified dementia without behavioral disturbance: Secondary | ICD-10-CM | POA: Diagnosis present

## 2021-04-06 DIAGNOSIS — I639 Cerebral infarction, unspecified: Secondary | ICD-10-CM

## 2021-04-06 DIAGNOSIS — Z20822 Contact with and (suspected) exposure to covid-19: Secondary | ICD-10-CM | POA: Diagnosis present

## 2021-04-06 DIAGNOSIS — Z79899 Other long term (current) drug therapy: Secondary | ICD-10-CM | POA: Diagnosis not present

## 2021-04-06 DIAGNOSIS — Z7984 Long term (current) use of oral hypoglycemic drugs: Secondary | ICD-10-CM | POA: Diagnosis not present

## 2021-04-06 DIAGNOSIS — Z8249 Family history of ischemic heart disease and other diseases of the circulatory system: Secondary | ICD-10-CM | POA: Diagnosis not present

## 2021-04-06 DIAGNOSIS — E1122 Type 2 diabetes mellitus with diabetic chronic kidney disease: Secondary | ICD-10-CM | POA: Diagnosis present

## 2021-04-06 DIAGNOSIS — G9341 Metabolic encephalopathy: Secondary | ICD-10-CM

## 2021-04-06 DIAGNOSIS — D649 Anemia, unspecified: Secondary | ICD-10-CM | POA: Diagnosis present

## 2021-04-06 DIAGNOSIS — I129 Hypertensive chronic kidney disease with stage 1 through stage 4 chronic kidney disease, or unspecified chronic kidney disease: Secondary | ICD-10-CM | POA: Diagnosis present

## 2021-04-06 DIAGNOSIS — I6389 Other cerebral infarction: Secondary | ICD-10-CM | POA: Diagnosis not present

## 2021-04-06 DIAGNOSIS — D696 Thrombocytopenia, unspecified: Secondary | ICD-10-CM | POA: Diagnosis present

## 2021-04-06 DIAGNOSIS — R739 Hyperglycemia, unspecified: Secondary | ICD-10-CM | POA: Diagnosis present

## 2021-04-06 DIAGNOSIS — N179 Acute kidney failure, unspecified: Secondary | ICD-10-CM | POA: Diagnosis present

## 2021-04-06 DIAGNOSIS — R1310 Dysphagia, unspecified: Secondary | ICD-10-CM | POA: Diagnosis present

## 2021-04-06 DIAGNOSIS — I6340 Cerebral infarction due to embolism of unspecified cerebral artery: Secondary | ICD-10-CM | POA: Diagnosis present

## 2021-04-06 DIAGNOSIS — I513 Intracardiac thrombosis, not elsewhere classified: Secondary | ICD-10-CM | POA: Diagnosis present

## 2021-04-06 DIAGNOSIS — E1165 Type 2 diabetes mellitus with hyperglycemia: Secondary | ICD-10-CM | POA: Diagnosis present

## 2021-04-06 DIAGNOSIS — Z902 Acquired absence of lung [part of]: Secondary | ICD-10-CM | POA: Diagnosis not present

## 2021-04-06 DIAGNOSIS — F1011 Alcohol abuse, in remission: Secondary | ICD-10-CM | POA: Diagnosis present

## 2021-04-06 DIAGNOSIS — F17210 Nicotine dependence, cigarettes, uncomplicated: Secondary | ICD-10-CM | POA: Diagnosis present

## 2021-04-06 DIAGNOSIS — N189 Chronic kidney disease, unspecified: Secondary | ICD-10-CM | POA: Diagnosis present

## 2021-04-06 DIAGNOSIS — F0280 Dementia in other diseases classified elsewhere without behavioral disturbance: Secondary | ICD-10-CM | POA: Diagnosis present

## 2021-04-06 LAB — RENAL FUNCTION PANEL
Albumin: 3.8 g/dL (ref 3.5–5.0)
Anion gap: 8 (ref 5–15)
BUN: 25 mg/dL — ABNORMAL HIGH (ref 8–23)
CO2: 24 mmol/L (ref 22–32)
Calcium: 9.3 mg/dL (ref 8.9–10.3)
Chloride: 108 mmol/L (ref 98–111)
Creatinine, Ser: 1.81 mg/dL — ABNORMAL HIGH (ref 0.61–1.24)
GFR, Estimated: 39 mL/min — ABNORMAL LOW (ref >60)
Glucose, Bld: 143 mg/dL — ABNORMAL HIGH (ref 70–99)
Phosphorus: 3.3 mg/dL (ref 2.5–4.6)
Potassium: 3.5 mmol/L (ref 3.5–5.1)
Sodium: 140 mmol/L (ref 135–145)

## 2021-04-06 LAB — URINALYSIS, ROUTINE W REFLEX MICROSCOPIC
Bilirubin Urine: NEGATIVE
Glucose, UA: >=500 mg/dL — AB
Ketones, ur: NEGATIVE mg/dL
Leukocytes,Ua: NEGATIVE
Nitrite: NEGATIVE
Protein, ur: >=300 mg/dL — AB
Specific Gravity, Urine: 1.013 (ref 1.005–1.030)
pH: 5 (ref 5.0–8.0)

## 2021-04-06 LAB — CBC
HCT: 32.7 % — ABNORMAL LOW (ref 39.0–52.0)
Hemoglobin: 10.7 g/dL — ABNORMAL LOW (ref 13.0–17.0)
MCH: 32.7 pg (ref 26.0–34.0)
MCHC: 32.7 g/dL (ref 30.0–36.0)
MCV: 100 fL (ref 80.0–100.0)
Platelets: 122 10*3/uL — ABNORMAL LOW (ref 150–400)
RBC: 3.27 MIL/uL — ABNORMAL LOW (ref 4.22–5.81)
RDW: 12.5 % (ref 11.5–15.5)
WBC: 8.9 10*3/uL (ref 4.0–10.5)
nRBC: 0 % (ref 0.0–0.2)

## 2021-04-06 LAB — TSH: TSH: 0.877 u[IU]/mL (ref 0.350–4.500)

## 2021-04-06 LAB — ECHOCARDIOGRAM COMPLETE
Area-P 1/2: 3.45 cm2
Height: 70 in
S' Lateral: 3.1 cm
Weight: 2000 oz

## 2021-04-06 LAB — BASIC METABOLIC PANEL
Anion gap: 7 (ref 5–15)
BUN: 25 mg/dL — ABNORMAL HIGH (ref 8–23)
CO2: 24 mmol/L (ref 22–32)
Calcium: 9.3 mg/dL (ref 8.9–10.3)
Chloride: 109 mmol/L (ref 98–111)
Creatinine, Ser: 1.8 mg/dL — ABNORMAL HIGH (ref 0.61–1.24)
GFR, Estimated: 39 mL/min — ABNORMAL LOW (ref >60)
Glucose, Bld: 144 mg/dL — ABNORMAL HIGH (ref 70–99)
Potassium: 3.5 mmol/L (ref 3.5–5.1)
Sodium: 140 mmol/L (ref 135–145)

## 2021-04-06 LAB — GLUCOSE, CAPILLARY
Glucose-Capillary: 54 mg/dL — ABNORMAL LOW (ref 70–99)
Glucose-Capillary: 119 mg/dL — ABNORMAL HIGH (ref 70–99)
Glucose-Capillary: 129 mg/dL — ABNORMAL HIGH (ref 70–99)
Glucose-Capillary: 188 mg/dL — ABNORMAL HIGH (ref 70–99)
Glucose-Capillary: 46 mg/dL — ABNORMAL LOW (ref 70–99)
Glucose-Capillary: 145 mg/dL — ABNORMAL HIGH (ref 70–99)
Glucose-Capillary: 148 mg/dL — ABNORMAL HIGH (ref 70–99)

## 2021-04-06 LAB — VITAMIN B12: Vitamin B-12: 302 pg/mL (ref 180–914)

## 2021-04-06 LAB — RAPID URINE DRUG SCREEN, HOSP PERFORMED
Amphetamines: NOT DETECTED
Barbiturates: NOT DETECTED
Benzodiazepines: NOT DETECTED
Cocaine: NOT DETECTED
Opiates: NOT DETECTED
Tetrahydrocannabinol: NOT DETECTED

## 2021-04-06 LAB — FOLATE: Folate: 6.2 ng/mL (ref >5.9)

## 2021-04-06 LAB — MAGNESIUM: Magnesium: 1.8 mg/dL (ref 1.7–2.4)

## 2021-04-06 MED ORDER — ATENOLOL 25 MG PO TABS
25.0000 mg | ORAL_TABLET | Freq: Every day | ORAL | Status: DC
  Administered 2021-04-07: 25 mg via ORAL
  Filled 2021-04-06: qty 1

## 2021-04-06 MED ORDER — DEXTROSE 50 % IV SOLN
50.0000 mL | Freq: Once | INTRAVENOUS | Status: DC
  Filled 2021-04-06: qty 50

## 2021-04-06 MED ORDER — INSULIN GLARGINE-YFGN 100 UNIT/ML ~~LOC~~ SOLN
6.0000 [IU] | Freq: Every day | SUBCUTANEOUS | Status: DC
  Filled 2021-04-06 (×2): qty 0.06

## 2021-04-06 MED ORDER — DEXTROSE 50 % IV SOLN
50.0000 mL | Freq: Once | INTRAVENOUS | Status: AC
  Administered 2021-04-06: 50 mL via INTRAVENOUS

## 2021-04-06 MED ORDER — INSULIN GLARGINE-YFGN 100 UNIT/ML ~~LOC~~ SOLN
8.0000 [IU] | Freq: Every day | SUBCUTANEOUS | Status: DC
  Administered 2021-04-06: 8 [IU] via SUBCUTANEOUS
  Filled 2021-04-06 (×2): qty 0.08

## 2021-04-06 MED ORDER — TRAZODONE HCL 50 MG PO TABS
50.0000 mg | ORAL_TABLET | Freq: Every day | ORAL | Status: DC
  Administered 2021-04-06 – 2021-04-07 (×2): 50 mg via ORAL
  Filled 2021-04-06 (×2): qty 1

## 2021-04-06 MED ORDER — CLOPIDOGREL BISULFATE 75 MG PO TABS
75.0000 mg | ORAL_TABLET | Freq: Every day | ORAL | Status: DC
  Administered 2021-04-06: 75 mg via ORAL
  Filled 2021-04-06 (×2): qty 1

## 2021-04-06 NOTE — Discharge Instructions (Addendum)
Current A1C is 12.1% indicating an average glucose of 301 mg/dl. Goal A1C is 7% or less. If family is interested in using [employer] for glucose monitoring, please discuss with primary care providers at the [facility] about prescribing.   IMPORTANT INFORMATION: PAY CLOSE ATTENTION   PHYSICIAN DISCHARGE INSTRUCTIONS  Follow with Primary care provider  [facility]  and other consultants as instructed by your Hospitalist Physician  SEEK MEDICAL CARE OR RETURN TO EMERGENCY ROOM IF SYMPTOMS COME BACK, WORSEN OR NEW PROBLEM DEVELOPS   Please note: You were cared for by a hospitalist during your hospital stay. Every effort will be made to forward records to your primary care provider.  You can request that your primary care provider send for your hospital records if they have not received them.  Once you are discharged, your primary care physician will handle any further medical issues. Please note that NO REFILLS for any discharge medications will be authorized once you are discharged, as it is imperative that you return to your primary care physician (or establish a relationship with a primary care physician if you do not have one) for your post hospital discharge needs so that they can reassess your need for medications and monitor your lab values.  Please get a complete blood count and chemistry panel checked by your Primary MD at your next visit, and again as instructed by your Primary MD.  Get Medicines reviewed and adjusted: Please take all your medications with you for your next visit with your Primary MD  Laboratory/radiological data: Please request your Primary MD to go over all hospital tests and procedure/radiological results at the follow up, please ask your primary care provider to get all Hospital records sent to his/her office.  In some cases, they will be blood work, cultures and biopsy results pending at the time of your discharge. Please request that your primary care  provider follow up on these results.  If you are diabetic, please bring your blood sugar readings with you to your follow up appointment with primary care.    Please call and make your follow up appointments as soon as possible.    Also Note the following: If you experience worsening of your admission symptoms, develop shortness of breath, life threatening emergency, suicidal or homicidal thoughts you must seek medical attention immediately by calling 911 or calling your MD immediately  if symptoms less severe.  You must read complete instructions/literature along with all the possible adverse reactions/side effects for all the Medicines you take and that have been prescribed to you. Take any new Medicines after you have completely understood and accpet all the possible adverse reactions/side effects.   Do not drive when taking Pain medications or sleeping medications (Benzodiazepines)  Do not take more than prescribed Pain, Sleep and Anxiety Medications. It is not advisable to combine anxiety,sleep and pain medications without talking with your primary care practitioner  Special Instructions: If you have smoked or chewed Tobacco  in the last 2 yrs please stop smoking, stop any regular Alcohol  and or any Recreational drug use.  Wear Seat belts while driving.  Do not drive if taking any narcotic, mind altering or controlled substances or recreational drugs or alcohol.

## 2021-04-06 NOTE — Progress Notes (Signed)
Patients cbg was 46. Patient is asymptomatic. Hypoglycemia order set ordered. Patient was given grape juice. MD Courage notified. MD Courage ordered an amp of D50. Amp of D50 given. CBG checked 15 minutes after. CBG was 145.

## 2021-04-06 NOTE — Progress Notes (Addendum)
Patient Demographics:    Joshua Morales, is a 75 y.o. male, DOB - 1946-06-04, NTZ:[id number]  Admit date - 04/05/2021   Admitting Physician Tharon Kitch Denton Brick, MD  Outpatient Primary MD for the patient is [facility] [place]  LOS - 0   Chief Complaint  Patient presents with   Altered Mental Status        Subjective:    Joshua Morales today has no fevers, no emesis,  No chest pain,   -- Patient's daughter Marjorie Smolder at bedside -Patient is more awake, more coherent, speech and swallowing is improved, able to move all 4 extremities----overall significant improvement in mentation and mobility compared to 04/05/2021  Assessment  & Plan :    Principal Problem:   Acute CVA (cerebrovascular accident)/Multiple punctate acute/sub infarcts in the bilateral anterior Active Problems:   Diabetes mellitus type 2, uncontrolled ([place])   HTN (hypertension)   Dementia associated with alcoholism ([place])   Acute metabolic encephalopathy   Tobacco abuse   Alcohol abuse, 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 male with past medical history relevant for uncontrolled DM, hypertension, alcohol abuse, tobacco abuse, alcohol-related dementia and agent orange exposure admitted on 04/05/2021 with generalized weakness and altered mentation and found to have multiple acute and subacute bilateral strokes consistent with embolic type phenomena  A/p  1)Acute CVA/Multiple punctate acute/sub infarcts in the bilateral anterior circulation suggesting embolic event--- EKG today is NSR ---- Place on telemetry monitored unit,  --treat empirically with aspirin and  Lipitor  Rx,  MRI Brain-- confirms embolic strokes as above ---neurologist Dr. Merlene Laughter recommends TEE ---cardiology consult for TEE requested echocardiogram with EF of 50 to 55 %, with LVH and  Grade 1 Diastolic Dysfunction  -carotid artery Dopplers -without hemodynamically significant stenosis -MRA head without LVO -Official neurology consult pending.   -PT/OT eval pending, speech eval pending, We will allow some permissive hypertension in view of possible acute stroke, avoid precipitous drop in blood pressure.  TSH is 0.87,  -A1c 12.1 Fasting lipid profile--pending -Aspirin and Plavix for now--defer to neurology if okay to use Eliquis instead given concerns for embolic stroke -TEE pending as above -Patient will benefit from 30-day event monitor postdischarge to rule out A. Fib # Maximize blood glucose control with insulin coverage (goal FS 60-180) #Please maintain euthermia. Tylenol prn for temp >100.4 # Telemetry monitoring -EEG without epileptiform findings -UDS pending B12 and folate WNL  2)Aki Vs Possible CKD--- --creatinine on admission 2.19 baseline not available  -Creatinine down to 1.8 with hydration avoid Nephrotoxic agents / dehydration  / hypotension  3)Acute Versus chronic anemia --  hemoglobin is 12.0 >> 10.7,  platelets 221 >> 122 , monitor closely, check stool for occult blood and transfuse as indicated  4)history of alcoholic dementia--- supportive care, Aricept, Namenda and Seroquel as ordered--B12 and folate WNL  5) tobacco abuse--nicotine patch as ordered  6)DM2-uncontrolled DM with hyperglycemia--- on admission patient did not meet criteria for DKA -A1c is 12.1 reflecting uncontrolled DM with hyperglycemia PTA -IV fluids and subcu insulin as ordered   7) history of EtOH abuse--- longstanding history of heavy alcohol use -Quit drinking in March 2022 after DUI -c/n  thiamine folic acid on multivitamin -DTs unlikely given lack of EtOH use recently  Disposition/Need for in-Hospital Stay- patient unable to be discharged at this time due to acute embolic stroke, awaiting TEE as well as PT OT and speech eval  Status is: Inpatient  Remains inpatient  appropriate because: Please see disposition above  Disposition: The patient is from: Home              Anticipated d/c is to: SNF Vs Home with [facility]              Anticipated d/c date is: 1 day              Patient currently is not medically stable to d/c. Barriers: Not Clinically Stable-   Code Status :  -  Code Status: Full Code   Family Communication:   (patient is alert, awake and coherent)  Discussed with daughter Marjorie Smolder at bedside Consults  :  Neurology/Cardiology  DVT Prophylaxis  :   - SCDs   heparin injection 5,000 Units Start: 04/05/21 2200 SCDs Start: 04/05/21 1944 Place TED hose Start: 04/05/21 1944    Lab Results  Component Value Date   PLT 122 (L) 04/06/2021    Inpatient Medications  Scheduled Meds:  aspirin EC  81 mg Oral Q breakfast   [START ON 04/07/2021] atenolol  25 mg Oral QHS   atorvastatin  40 mg Oral Daily   clopidogrel  75 mg Oral Daily   dextrose  50 mL Intravenous Once   donepezil  5 mg Oral QHS   folic acid  1 mg Oral Daily   heparin  5,000 Units Subcutaneous Q8H   insulin aspart  0-15 Units Subcutaneous TID WC   insulin aspart  0-5 Units Subcutaneous QHS   insulin glargine-yfgn  8 Units Subcutaneous Daily   insulin starter kit- pen needles  1 kit Other Once   living well with diabetes book   Does not apply Once   memantine  5 mg Oral BID   multivitamin with minerals  1 tablet Oral Daily   nicotine  14 mg Transdermal Daily   QUEtiapine  25 mg Oral QHS   sodium chloride flush  3 mL Intravenous Q12H   sodium chloride flush  3 mL Intravenous Q12H   thiamine  100 mg Oral Daily   traZODone  50 mg Oral QHS   Continuous Infusions:  sodium chloride 50 mL/hr at 04/06/21 0020   sodium chloride     PRN Meds:.sodium chloride, acetaminophen **OR** acetaminophen, bisacodyl, dextrose, hydrOXYzine, labetalol, ondansetron **OR** ondansetron (ZOFRAN) IV, polyethylene glycol, sodium chloride flush    Anti-infectives (From admission, onward)    None          Objective:   Vitals:   04/05/21 2009 04/06/21 0512 04/06/21 0851 04/06/21 1358  BP: (!) 147/81 (!) 152/85 (!) 167/78 115/75  Pulse: 95 100 87 65  Resp:  16  16  Temp: 98.2 F (36.8 C) 99.1 F (37.3 C)  98 F (36.7 C)  TempSrc: Oral Axillary  Oral  SpO2: 100% 100%  100%  Weight:      Height:        Wt Readings from Last 3 Encounters:  04/05/21 56.7 kg     Intake/Output Summary (Last 24 hours) at 04/06/2021 1455 Last data filed at 04/06/2021 0900 Gross per 24 hour  Intake 3034.18 ml  Output --  Net 3034.18 ml     Physical Exam  Gen:- Awake Alert, cachectic, chronically ill-appearing  HEENT:- [place].AT, No sclera icterus Neck-Supple Neck,No JVD,.  Lungs-  CTAB , fair symmetrical air movement CV- S1, S2 normal, regular  Abd-  +ve B.Sounds, Abd Soft, No tenderness,    Extremity/Skin:- No  edema, pedal pulses present  Psych-affect is appropriate, oriented x3 Neuro-generalized weakness, overall patient appears more awake, more responsive, more interactive today compared to 04/05/2021, moving all extremities on 04/06/2021  Data Review:   Micro Results Recent Results (from the past 240 hour(s))  Resp Panel by RT-PCR (Flu A&B, Covid) Nasopharyngeal Swab     Status: None   Collection Time: 04/05/21 10:29 AM   Specimen: Nasopharyngeal Swab; Nasopharyngeal(NP) swabs in vial transport medium  Result Value Ref Range Status   SARS Coronavirus 2 by RT PCR NEGATIVE NEGATIVE Final    Comment: (NOTE) SARS-CoV-2 target nucleic acids are NOT DETECTED.  The SARS-CoV-2 RNA is generally detectable in upper respiratory specimens during the acute phase of infection. The lowest concentration of SARS-CoV-2 viral copies this assay can detect is 138 copies/mL. A negative result does not preclude SARS-Cov-2 infection and should not be used as the sole basis for treatment or other patient management decisions. A negative result may occur with  improper specimen collection/handling, submission  of specimen other than nasopharyngeal swab, presence of viral mutation(s) within the areas targeted by this assay, and inadequate number of viral copies(<138 copies/mL). A negative result must be combined with clinical observations, patient history, and epidemiological information. The expected result is Negative.  Fact Sheet for Patients:  EntrepreneurPulse.com.au  Fact Sheet for Healthcare Providers:  IncredibleEmployment.be  This test is no t yet approved or cleared by the [place] FDA and  has been authorized for detection and/or diagnosis of SARS-CoV-2 by FDA under an Emergency Use Authorization (EUA). This EUA will remain  in effect (meaning this test can be used) for the duration of the COVID-19 declaration under Section 564(b)(1) of the Act, 21 U.S.C.section 360bbb-3(b)(1), unless the authorization is terminated  or revoked sooner.       Influenza A by PCR NEGATIVE NEGATIVE Final   Influenza B by PCR NEGATIVE NEGATIVE Final    Comment: (NOTE) The Xpert Xpress SARS-CoV-2/FLU/RSV plus assay is intended as an aid in the diagnosis of influenza from Nasopharyngeal swab specimens and should not be used as a sole basis for treatment. Nasal washings and aspirates are unacceptable for Xpert Xpress SARS-CoV-2/FLU/RSV testing.  Fact Sheet for Patients: EntrepreneurPulse.com.au  Fact Sheet for Healthcare Providers: IncredibleEmployment.be  This test is not yet approved or cleared by the [place] FDA and has been authorized for detection and/or diagnosis of SARS-CoV-2 by FDA under an Emergency Use Authorization (EUA). This EUA will remain in effect (meaning this test can be used) for the duration of the COVID-19 declaration under Section 564(b)(1) of the Act, 21 U.S.C. section 360bbb-3(b)(1), unless the authorization is terminated or revoked.  Performed at [facility], [address],  [place], [place] [address]     Radiology Reports CT HEAD WO CONTRAST  Result Date: 04/05/2021 CLINICAL DATA:  Mental status change, unknown cause. Awoke today with difficulty speaking. Dementia EXAM: CT HEAD WITHOUT CONTRAST TECHNIQUE: Contiguous axial images were obtained from the base of the skull through the vertex without intravenous contrast. COMPARISON:  None. FINDINGS: Brain: Generalized atrophy. There may be some frontal and temporal predominance. Chronic small-vessel ischemic change of the hemispheric white matter. No sign of acute infarction, mass lesion, hemorrhage, hydrocephalus or extra-axial collection. Vascular: There is atherosclerotic calcification of the major vessels  at the base of the brain. Skull: Negative Sinuses/Orbits: Clear/normal Other: None IMPRESSION: No acute brain finding. Brain atrophy, possibly with some frontal and temporal predominance. Chronic small-vessel change of the hemispheric white matter. Electronically Signed   By: Nelson Chimes M.D.   On: 04/05/2021 11:03   MR ANGIO HEAD WO CONTRAST  Result Date: 04/06/2021 CLINICAL DATA:  Mental status change, unknown cause. EXAM: MRI HEAD WITHOUT CONTRAST MRA HEAD WITHOUT CONTRAST TECHNIQUE: Multiplanar, multi-echo pulse sequences of the brain and surrounding structures were acquired without intravenous contrast. Angiographic images of the Circle of Willis were acquired using MRA technique without intravenous contrast. COMPARISON:  Head CT April 05, 2021 FINDINGS: MRI HEAD FINDINGS Brain: Scattered small foci of restricted diffusion within the bilateral frontal parietal regions and the splenium of the corpus callosum, consistent with acute/subacute infarcts. Punctate focus of susceptibility artifact in the left occipital lobe suggesting small hemosiderin deposit. Scattered foci of T2 hyperintensity are seen within the white matter of the cerebral hemispheres, nonspecific, most likely related to small vessel ischemia. Mild parenchymal  volume loss. Vascular: Normal flow voids. Skull and upper cervical spine: Normal marrow signal. Sinuses/Orbits: Left lens surgery. Paranasal sinuses are essentially clear. MRA HEAD FINDINGS Anterior circulation: Luminal irregularity of the bilateral intracranial internal carotid arteries with mild-to-moderate stenosis at the distal left cavernous segment. The bilateral anterior cerebral arteries and middle cerebral arteries are widely patent with antegrade flow without high-grade flow-limiting stenosis or proximal branch occlusion. No intracranial aneurysm within the anterior circulation. Posterior circulation: The vertebral arteries are widely patent with antegrade flow. The posterior inferior cerebral arteries are normal. Vertebrobasilar junction and basilar artery are widely patent with antegrade flow without evidence of basilar stenosis or aneurysm. Posterior cerebral arteries are normal bilaterally. No intracranial aneurysm within the posterior circulation. Anatomic variants: None significant. IMPRESSION: 1. Multiple punctate acute/sub infarcts in the bilateral anterior circulation suggesting embolic event. 2. No intracranial large vessel occlusion. Mild-to-moderate stenosis of the cavernous left ICA. 3. Mild chronic microvascular ischemic changes and parenchymal volume loss. Electronically Signed   By: Pedro Earls M.D.   On: 04/06/2021 11:26   MR BRAIN WO CONTRAST  Result Date: 04/06/2021 CLINICAL DATA:  Mental status change, unknown cause. EXAM: MRI HEAD WITHOUT CONTRAST MRA HEAD WITHOUT CONTRAST TECHNIQUE: Multiplanar, multi-echo pulse sequences of the brain and surrounding structures were acquired without intravenous contrast. Angiographic images of the Circle of Willis were acquired using MRA technique without intravenous contrast. COMPARISON:  Head CT April 05, 2021 FINDINGS: MRI HEAD FINDINGS Brain: Scattered small foci of restricted diffusion within the bilateral frontal parietal  regions and the splenium of the corpus callosum, consistent with acute/subacute infarcts. Punctate focus of susceptibility artifact in the left occipital lobe suggesting small hemosiderin deposit. Scattered foci of T2 hyperintensity are seen within the white matter of the cerebral hemispheres, nonspecific, most likely related to small vessel ischemia. Mild parenchymal volume loss. Vascular: Normal flow voids. Skull and upper cervical spine: Normal marrow signal. Sinuses/Orbits: Left lens surgery. Paranasal sinuses are essentially clear. MRA HEAD FINDINGS Anterior circulation: Luminal irregularity of the bilateral intracranial internal carotid arteries with mild-to-moderate stenosis at the distal left cavernous segment. The bilateral anterior cerebral arteries and middle cerebral arteries are widely patent with antegrade flow without high-grade flow-limiting stenosis or proximal branch occlusion. No intracranial aneurysm within the anterior circulation. Posterior circulation: The vertebral arteries are widely patent with antegrade flow. The posterior inferior cerebral arteries are normal. Vertebrobasilar junction and basilar artery are widely patent with  antegrade flow without evidence of basilar stenosis or aneurysm. Posterior cerebral arteries are normal bilaterally. No intracranial aneurysm within the posterior circulation. Anatomic variants: None significant. IMPRESSION: 1. Multiple punctate acute/sub infarcts in the bilateral anterior circulation suggesting embolic event. 2. No intracranial large vessel occlusion. Mild-to-moderate stenosis of the cavernous left ICA. 3. Mild chronic microvascular ischemic changes and parenchymal volume loss. Electronically Signed   By: Pedro Earls M.D.   On: 04/06/2021 11:26   DG Chest Port 1 View  Result Date: 04/05/2021 CLINICAL DATA:  Weakness EXAM: PORTABLE CHEST 1 VIEW COMPARISON:  None. FINDINGS: Heart and mediastinal contours are within normal limits.  No focal opacities or effusions. No acute bony abnormality. IMPRESSION: No active disease. Electronically Signed   By: Rolm Baptise M.D.   On: 04/05/2021 10:00   EEG adult  Result Date: 04/06/2021 Lora Havens, MD     04/06/2021 12:33 PM Patient Name: RUTGER SALTON MRN: [id number] Epilepsy Attending: Lora Havens Referring Physician/Provider: Dr Denton Brick, Ismelda Weatherman Date: 04/06/2021 Duration: 23.02 mins Patient history: 75yo M with ams. EEG to evaluate for seizure Level of alertness: Awake, asleep AEDs during EEG study: None Technical aspects: This EEG study was done with scalp electrodes positioned according to the 10-20 International system of electrode placement. Electrical activity was acquired at a sampling rate of 500Hz  and reviewed with a high frequency filter of 70Hz  and a low frequency filter of 1Hz . EEG data were recorded continuously and digitally stored. Description: The posterior dominant rhythm consists of 7.5Hz  activity of moderate voltage (25-35 uV) seen predominantly in posterior head regions, symmetric and reactive to eye opening and eye closing. Sleep was characterized by vertex waves, sleep spindles (12 to 14 Hz), maximal frontocentral region.  EEG showed intermittent generalized  3 to 6 Hz theta-delta slowing. Hyperventilation and photic stimulation were not performed.   ABNORMALITY - Intermittent slow, generalized IMPRESSION: This study is suggestive of mild diffuse encephalopathy, nonspecific etiology. No seizures or epileptiform discharges were seen throughout the recording. Lora Havens     CBC Recent Labs  Lab 04/05/21 0938 04/06/21 0634  WBC 10.3 8.9  HGB 12.0* 10.7*  HCT 34.5* 32.7*  PLT 221 122*  MCV 96.4 100.0  MCH 33.5 32.7  MCHC 34.8 32.7  RDW 12.2 12.5    Chemistries  Recent Labs  Lab 04/05/21 0938 04/06/21 0634  NA 133* 140  140  K 4.6 3.5  3.5  CL 94* 108  109  CO2 25 24  24   GLUCOSE 584* 143*  144*  BUN 31* 25*  25*  CREATININE 2.19*  1.81*  1.80*  CALCIUM 9.7 9.3  9.3  MG  --  1.8  AST 17  --   ALT 23  --   ALKPHOS 67  --   BILITOT 1.2  --    ------------------------------------------------------------------------------------------------------------------ No results for input(s): CHOL, HDL, LDLCALC, TRIG, CHOLHDL, LDLDIRECT in the last 72 hours.  Lab Results  Component Value Date   HGBA1C 12.1 (H) 04/05/2021   ------------------------------------------------------------------------------------------------------------------ Recent Labs    04/06/21 0634  TSH 0.877   ------------------------------------------------------------------------------------------------------------------ Recent Labs    04/06/21 0634  VITAMINB12 302  FOLATE 6.2    Coagulation profile No results for input(s): INR, PROTIME in the last 168 hours.  No results for input(s): DDIMER in the last 72 hours.  Cardiac Enzymes No results for input(s): CKMB, TROPONINI, MYOGLOBIN in the last 168 hours.  Invalid input(s): CK ------------------------------------------------------------------------------------------------------------------ No results found for: BNP  Roxan Hockey M.D on 04/06/2021 at 2:55 PM  Go to [email or web address] - for contact info  [facility] - Office  [phone]

## 2021-04-06 NOTE — Consult Note (Signed)
Tolleson A. Merlene Laughter, MD     [email or web address]          Joshua Morales is an 75 y.o. male.   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 Additional workup with TEE is recommended.  A 30 day event monitor is also recommended. In meantime, dual antiplatelet agents are recommended. Long-term anticoagulation may be warranted depending on further workup. 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 The patient presented however with the acute changes in mentation /altered mental status and significant difficulties getting around.  The daughter reports that he had episode of significant weakness involving the right upper extremity but this resolved after a few weeks.  The other reports no clear focal weakness but global weakness and difficulty getting around recently. There are no reports of headache,, dizziness, chest pain or shortness of breath. The patient is disoriented and cannot provide a history.  GENERAL:  This is a thin male who cooperates with evaluation.  HEENT:  Normal  ABDOMEN: soft  EXTREMITIES: No edema   BACK: normal  SKIN: Normal by inspection.    MENTAL STATUS:  He is awake and the cooperates with evaluation. Speech is normal. He he thinks that he is in [place] in [place] and in the hospital there. He has little insight into his medical well-being.  CRANIAL NERVES: Pupils are equal, round and reactive to light and accomodation; extra ocular movements are full, there is no significant nystagmus; visual fields are full; upper and lower facial muscles are normal in strength and symmetric, there is no flattening of the nasolabial folds; tongue is midline; Visual fields are  intact.  MOTOR: There appears to be mild atrophy of the FDI bilaterally. Otherwise, normal tone, bulk and strength; no pronator drift.  COORDINATION: Left finger to nose is normal, right finger to nose is normal, No rest tremor; no intention tremor; no postural tremor; no bradykinesia.  SENSATION: Normal to pain.    Blood pressure 115/75, pulse 65, temperature 98 F (36.7 C), temperature source Oral, resp. rate 16, height 5' 10"  (1.778 m), weight 56.7 kg, SpO2 100 %.  Past Medical History:  Diagnosis Date   Agent orange exposure    Dementia ([place])    Diabetes mellitus without complication ([place])    Hypertension     Past Surgical History:  Procedure Laterality Date   LUNG REMOVAL, PARTIAL     TOE AMPUTATION      History reviewed. No pertinent family history.  Social History:  reports that he has been smoking cigarettes. He does not have any smokeless tobacco history on file. He reports current alcohol use. He reports that he does not use drugs.  Allergies: No Known Allergies  Medications: Prior to Admission medications   Medication Sig Start Date End Date Taking? Authorizing Provider  ACIDOPHILUS LACTOBACILLUS PO Take 2 capsules by mouth in the morning and at bedtime.   Yes [provider]  atenolol (TENORMIN) 50 MG tablet Take 50 mg by mouth daily.   Yes [provider]  atorvastatin (LIPITOR) 40 MG tablet Take 40 mg by mouth daily.   Yes [provider]  Cholecalciferol (D3-1000) 25 MCG (1000 UT) tablet Take 2,000 Units by mouth daily.   Yes [provider]  hydrOXYzine (ATARAX/VISTARIL) 10 MG tablet Take 10 mg  by mouth 2 (two) times daily as needed for itching.   Yes [provider]  metFORMIN (GLUCOPHAGE-XR) 500 MG 24 hr tablet Take 1,000 mg by mouth in the morning and at bedtime.   Yes [provider]  metoprolol tartrate (LOPRESSOR) 25 MG tablet Take 25 mg by mouth 2 (two) times daily.   Yes [provider]   mirtazapine (REMERON) 15 MG tablet Take 15 mg by mouth at bedtime.   Yes [provider]    Scheduled Meds:  aspirin EC  81 mg Oral Q breakfast   [START ON 04/07/2021] atenolol  25 mg Oral QHS   atorvastatin  40 mg Oral Daily   clopidogrel  75 mg Oral Daily   dextrose  50 mL Intravenous Once   donepezil  5 mg Oral QHS   folic acid  1 mg Oral Daily   heparin  5,000 Units Subcutaneous Q8H   insulin aspart  0-15 Units Subcutaneous TID WC   insulin aspart  0-5 Units Subcutaneous QHS   [START ON 04/07/2021] insulin glargine-yfgn  6 Units Subcutaneous Daily   insulin starter kit- pen needles  1 kit Other Once   living well with diabetes book   Does not apply Once   memantine  5 mg Oral BID   multivitamin with minerals  1 tablet Oral Daily   nicotine  14 mg Transdermal Daily   QUEtiapine  25 mg Oral QHS   sodium chloride flush  3 mL Intravenous Q12H   sodium chloride flush  3 mL Intravenous Q12H   thiamine  100 mg Oral Daily   traZODone  50 mg Oral QHS   Continuous Infusions:  sodium chloride 50 mL/hr at 04/06/21 0020   sodium chloride     PRN Meds:.sodium chloride, acetaminophen **OR** acetaminophen, bisacodyl, dextrose, hydrOXYzine, labetalol, ondansetron **OR** ondansetron (ZOFRAN) IV, polyethylene glycol, sodium chloride flush     Results for orders placed or performed during the hospital encounter of 04/05/21 (from the past 48 hour(s))  CBC     Status: Abnormal   Collection Time: 04/05/21  9:38 AM  Result Value Ref Range   WBC 10.3 4.0 - 10.5 K/uL   RBC 3.58 (L) 4.22 - 5.81 MIL/uL   Hemoglobin 12.0 (L) 13.0 - 17.0 g/dL   HCT 34.5 (L) 39.0 - 52.0 %   MCV 96.4 80.0 - 100.0 fL   MCH 33.5 26.0 - 34.0 pg   MCHC 34.8 30.0 - 36.0 g/dL   RDW 12.2 11.5 - 15.5 %   Platelets 221 150 - 400 K/uL   nRBC 0.0 0.0 - 0.2 %    Comment: Performed at [facility], [address], [place], [place] [address]  Comprehensive metabolic panel     Status: Abnormal   Collection Time:  04/05/21  9:38 AM  Result Value Ref Range   Sodium 133 (L) 135 - 145 mmol/L   Potassium 4.6 3.5 - 5.1 mmol/L   Chloride 94 (L) 98 - 111 mmol/L   CO2 25 22 - 32 mmol/L   Glucose, Bld 584 (HH) 70 - 99 mg/dL    Comment: Glucose reference range applies only to samples taken after fasting for at least 8 hours. CRITICAL RESULT CALLED TO, READ BACK BY AND VERIFIED WITH: CARDWELL, L AT 1014 ON 8.1.22 BY RUCINSKI, B    BUN 31 (H) 8 - 23 mg/dL   Creatinine, Ser 2.19 (H) 0.61 - 1.24 mg/dL   Calcium 9.7 8.9 - 10.3 mg/dL   Total  Protein 7.9 6.5 - 8.1 g/dL   Albumin 4.4 3.5 - 5.0 g/dL   AST 17 15 - 41 U/L   ALT 23 0 - 44 U/L   Alkaline Phosphatase 67 38 - 126 U/L   Total Bilirubin 1.2 0.3 - 1.2 mg/dL   GFR, Estimated 31 (L) >60 mL/min    Comment: (NOTE) Calculated using the CKD-EPI Creatinine Equation (2021)    Anion gap 14 5 - 15    Comment: Performed at [facility], [address], [place], [place] [address]  Ethanol     Status: None   Collection Time: 04/05/21  9:38 AM  Result Value Ref Range   Alcohol, Ethyl (B) <10 <10 mg/dL    Comment: (NOTE) Lowest detectable limit for serum alcohol is 10 mg/dL.  For medical purposes only. Performed at [facility], [address], [place], [place] [address]   Beta-hydroxybutyric acid     Status: Abnormal   Collection Time: 04/05/21  9:38 AM  Result Value Ref Range   Beta-Hydroxybutyric Acid 2.52 (H) 0.05 - 0.27 mmol/L    Comment: Performed at [facility], [address], [place], [place] [address]  Hemoglobin A1c     Status: Abnormal   Collection Time: 04/05/21  9:38 AM  Result Value Ref Range   Hgb A1c MFr Bld 12.1 (H) 4.8 - 5.6 %    Comment: (NOTE) Pre diabetes:          5.7%-6.4%  Diabetes:              >6.4%  Glycemic control for   <7.0% adults with diabetes    Mean Plasma Glucose 300.57 mg/dL    Comment: Performed at [facility] [address] [place], [place] [address]  Resp Panel by RT-PCR (Flu A&B, Covid)  Nasopharyngeal Swab     Status: None   Collection Time: 04/05/21 10:29 AM   Specimen: Nasopharyngeal Swab; Nasopharyngeal(NP) swabs in vial transport medium  Result Value Ref Range   SARS Coronavirus 2 by RT PCR NEGATIVE NEGATIVE    Comment: (NOTE) SARS-CoV-2 target nucleic acids are NOT DETECTED.  The SARS-CoV-2 RNA is generally detectable in upper respiratory specimens during the acute phase of infection. The lowest concentration of SARS-CoV-2 viral copies this assay can detect is 138 copies/mL. A negative result does not preclude SARS-Cov-2 infection and should not be used as the sole basis for treatment or other patient management decisions. A negative result may occur with  improper specimen collection/handling, submission of specimen other than nasopharyngeal swab, presence of viral mutation(s) within the areas targeted by this assay, and inadequate number of viral copies(<138 copies/mL). A negative result must be combined with clinical observations, patient history, and epidemiological information. The expected result is Negative.  Fact Sheet for Patients:  EntrepreneurPulse.com.au  Fact Sheet for Healthcare Providers:  IncredibleEmployment.be  This test is no t yet approved or cleared by the [place] FDA and  has been authorized for detection and/or diagnosis of SARS-CoV-2 by FDA under an Emergency Use Authorization (EUA). This EUA will remain  in effect (meaning this test can be used) for the duration of the COVID-19 declaration under Section 564(b)(1) of the Act, 21 U.S.C.section 360bbb-3(b)(1), unless the authorization is terminated  or revoked sooner.       Influenza A by PCR NEGATIVE NEGATIVE   Influenza B by PCR NEGATIVE NEGATIVE    Comment: (NOTE) The Xpert Xpress SARS-CoV-2/FLU/RSV plus assay is intended as an aid in the diagnosis of influenza  from Nasopharyngeal swab specimens and should not be used as a sole basis for  treatment. Nasal washings and aspirates are unacceptable for Xpert Xpress SARS-CoV-2/FLU/RSV testing.  Fact Sheet for Patients: EntrepreneurPulse.com.au  Fact Sheet for Healthcare Providers: IncredibleEmployment.be  This test is not yet approved or cleared by the [place] FDA and has been authorized for detection and/or diagnosis of SARS-CoV-2 by FDA under an Emergency Use Authorization (EUA). This EUA will remain in effect (meaning this test can be used) for the duration of the COVID-19 declaration under Section 564(b)(1) of the Act, 21 U.S.C. section 360bbb-3(b)(1), unless the authorization is terminated or revoked.  Performed at [facility] [address] [place], [place] [address]   CBG monitoring, ED     Status: Abnormal   Collection Time: 04/05/21 10:59 AM  Result Value Ref Range   Glucose-Capillary 539 (HH) 70 - 99 mg/dL    Comment: Glucose reference range applies only to samples taken after fasting for at least 8 hours.   Comment 1 Notify RN   CBG monitoring, ED     Status: Abnormal   Collection Time: 04/05/21  1:26 PM  Result Value Ref Range   Glucose-Capillary 417 (H) 70 - 99 mg/dL    Comment: Glucose reference range applies only to samples taken after fasting for at least 8 hours.  CBG monitoring, ED     Status: Abnormal   Collection Time: 04/05/21  2:00 PM  Result Value Ref Range   Glucose-Capillary 337 (H) 70 - 99 mg/dL    Comment: Glucose reference range applies only to samples taken after fasting for at least 8 hours.  Urinalysis, Routine w reflex microscopic Urine, Clean Catch     Status: Abnormal   Collection Time: 04/05/21  3:59 PM  Result Value Ref Range   Color, Urine YELLOW YELLOW   APPearance CLOUDY (A) CLEAR   Specific Gravity, Urine 1.013 1.005 - 1.030   pH 5.0 5.0 - 8.0   Glucose, UA >=500 (A) NEGATIVE mg/dL   Hgb urine dipstick MODERATE (A) NEGATIVE   Bilirubin Urine NEGATIVE NEGATIVE   Ketones, ur  NEGATIVE NEGATIVE mg/dL   Protein, ur >=300 (A) NEGATIVE mg/dL   Nitrite NEGATIVE NEGATIVE   Leukocytes,Ua NEGATIVE NEGATIVE   RBC / HPF 11-20 0 - 5 RBC/hpf   WBC, UA 0-5 0 - 5 WBC/hpf   Bacteria, UA FEW (A) NONE SEEN   Squamous Epithelial / LPF 0-5 0 - 5   Budding Yeast PRESENT    Ca Oxalate Crys, UA PRESENT     Comment: Performed at [facility], [address] [place], [place] [address]  CBG monitoring, ED     Status: None   Collection Time: 04/05/21  5:48 PM  Result Value Ref Range   Glucose-Capillary 83 70 - 99 mg/dL    Comment: Glucose reference range applies only to samples taken after fasting for at least 8 hours.  Glucose, capillary     Status: Abnormal   Collection Time: 04/05/21  6:34 PM  Result Value Ref Range   Glucose-Capillary 66 (L) 70 - 99 mg/dL    Comment: Glucose reference range applies only to samples taken after fasting for at least 8 hours.  Glucose, capillary     Status: Abnormal   Collection Time: 04/05/21  8:05 PM  Result Value Ref Range   Glucose-Capillary 118 (H) 70 - 99 mg/dL    Comment: Glucose reference range applies only to samples taken after fasting for at least 8 hours.  Glucose, capillary     Status: Abnormal   Collection Time: 04/06/21  5:23 AM  Result Value Ref Range   Glucose-Capillary 54 (L) 70 - 99 mg/dL    Comment: Glucose reference range applies only to samples taken after fasting for at least 8 hours.   Comment 1 Notify RN    Comment 2 Document in Chart   CBC     Status: Abnormal   Collection Time: 04/06/21  6:34 AM  Result Value Ref Range   WBC 8.9 4.0 - 10.5 K/uL   RBC 3.27 (L) 4.22 - 5.81 MIL/uL   Hemoglobin 10.7 (L) 13.0 - 17.0 g/dL   HCT 32.7 (L) 39.0 - 52.0 %   MCV 100.0 80.0 - 100.0 fL   MCH 32.7 26.0 - 34.0 pg   MCHC 32.7 30.0 - 36.0 g/dL   RDW 12.5 11.5 - 15.5 %   Platelets 122 (L) 150 - 400 K/uL    Comment: SPECIMEN CHECKED FOR CLOTS Immature Platelet Fraction may be clinically indicated, consider ordering this  additional test [id number] PLATELET COUNT CONFIRMED BY SMEAR    nRBC 0.0 0.0 - 0.2 %    Comment: Performed at [facility], [address] [place], [place] [address]  Renal function panel     Status: Abnormal   Collection Time: 04/06/21  6:34 AM  Result Value Ref Range   Sodium 140 135 - 145 mmol/L   Potassium 3.5 3.5 - 5.1 mmol/L   Chloride 108 98 - 111 mmol/L   CO2 24 22 - 32 mmol/L   Glucose, Bld 143 (H) 70 - 99 mg/dL    Comment: Glucose reference range applies only to samples taken after fasting for at least 8 hours.   BUN 25 (H) 8 - 23 mg/dL   Creatinine, Ser 1.81 (H) 0.61 - 1.24 mg/dL   Calcium 9.3 8.9 - 10.3 mg/dL   Phosphorus 3.3 2.5 - 4.6 mg/dL   Albumin 3.8 3.5 - 5.0 g/dL   GFR, Estimated 39 (L) >60 mL/min    Comment: (NOTE) Calculated using the CKD-EPI Creatinine Equation (2021)    Anion gap 8 5 - 15    Comment: Performed at [facility], [address], [place], [place] [address]  Magnesium     Status: None   Collection Time: 04/06/21  6:34 AM  Result Value Ref Range   Magnesium 1.8 1.7 - 2.4 mg/dL    Comment: Performed at [facility], [address], [place], [place] [address]  Basic metabolic panel     Status: Abnormal   Collection Time: 04/06/21  6:34 AM  Result Value Ref Range   Sodium 140 135 - 145 mmol/L    Comment: DELTA CHECK NOTED   Potassium 3.5 3.5 - 5.1 mmol/L   Chloride 109 98 - 111 mmol/L   CO2 24 22 - 32 mmol/L   Glucose, Bld 144 (H) 70 - 99 mg/dL    Comment: Glucose reference range applies only to samples taken after fasting for at least 8 hours.   BUN 25 (H) 8 - 23 mg/dL   Creatinine, Ser 1.80 (H) 0.61 - 1.24 mg/dL   Calcium 9.3 8.9 - 10.3 mg/dL   GFR, Estimated 39 (L) >60 mL/min    Comment: (NOTE) Calculated using the CKD-EPI Creatinine Equation (2021)    Anion gap 7 5 - 15    Comment: Performed at [facility], [address], [place],  [address]  TSH     Status: None   Collection  Time: 04/06/21  6:34 AM  Result Value Ref Range    TSH 0.877 0.350 - 4.500 uIU/mL    Comment: Performed by a 3rd Generation assay with a functional sensitivity of <=0.01 uIU/mL. Performed at [facility], [address] [place], [place] [address]   Vitamin B12     Status: None   Collection Time: 04/06/21  6:34 AM  Result Value Ref Range   Vitamin B-12 302 180 - 914 pg/mL    Comment: (NOTE) This assay is not validated for testing neonatal or myeloproliferative syndrome specimens for Vitamin B12 levels. Performed at [facility], [address], [place], [place] [address]   Folate     Status: None   Collection Time: 04/06/21  6:34 AM  Result Value Ref Range   Folate 6.2 >5.9 ng/mL    Comment: Performed at [facility], [address] [place], [place] [address]  Glucose, capillary     Status: Abnormal   Collection Time: 04/06/21  6:52 AM  Result Value Ref Range   Glucose-Capillary 129 (H) 70 - 99 mg/dL    Comment: Glucose reference range applies only to samples taken after fasting for at least 8 hours.  Glucose, capillary     Status: Abnormal   Collection Time: 04/06/21  7:28 AM  Result Value Ref Range   Glucose-Capillary 119 (H) 70 - 99 mg/dL    Comment: Glucose reference range applies only to samples taken after fasting for at least 8 hours.  Glucose, capillary     Status: Abnormal   Collection Time: 04/06/21 11:06 AM  Result Value Ref Range   Glucose-Capillary 188 (H) 70 - 99 mg/dL    Comment: Glucose reference range applies only to samples taken after fasting for at least 8 hours.  Urine rapid drug screen (hosp performed)     Status: None   Collection Time: 04/06/21  3:59 PM  Result Value Ref Range   Opiates NONE DETECTED NONE DETECTED   Cocaine NONE DETECTED NONE DETECTED   Benzodiazepines NONE DETECTED NONE DETECTED   Amphetamines NONE DETECTED NONE DETECTED   Tetrahydrocannabinol NONE DETECTED NONE DETECTED   Barbiturates NONE DETECTED NONE DETECTED    Comment: (NOTE) DRUG SCREEN FOR MEDICAL PURPOSES ONLY.  IF  CONFIRMATION IS NEEDED FOR ANY PURPOSE, NOTIFY LAB WITHIN 5 DAYS.  LOWEST DETECTABLE LIMITS FOR URINE DRUG SCREEN Drug Class                     Cutoff (ng/mL) Amphetamine and metabolites    1000 Barbiturate and metabolites    200 Benzodiazepine                 536 Tricyclics and metabolites     300 Opiates and metabolites        300 Cocaine and metabolites        300 THC                            50 Performed at [facility], [address], [place], [place] [address]   Glucose, capillary     Status: Abnormal   Collection Time: 04/06/21  5:11 PM  Result Value Ref Range   Glucose-Capillary 46 (L) 70 - 99 mg/dL    Comment: Glucose reference range applies only to samples taken after fasting for at least 8 hours.    Studies/Results:  TEE  1. Left ventricular ejection fraction, by estimation, is 50 to 55%. The  left ventricle has low normal function. The left ventricle has no regional  wall motion abnormalities. There is moderate to severe left ventricular  hypertrophy. Left ventricular  diastolic parameters are consistent with Grade I diastolic dysfunction  (impaired relaxation).   2. Right ventricular systolic function is normal. The right ventricular  size is normal. Tricuspid regurgitation signal is inadequate for assessing  PA pressure.   3. Left atrial size was mildly dilated.   4. There is trivial pericardial effusion anterior to the right ventricle.   5. The mitral valve is myxomatous. Trivial mitral valve regurgitation.   6. The aortic valve is tricuspid. Aortic valve regurgitation is not  visualized. Mild aortic valve sclerosis is present, with no evidence of  aortic valve stenosis.   7. The inferior vena cava is normal in size with greater than 50%  respiratory variability, suggesting right atrial pressure of 3 mmHg.     CAROTID IMPRESSION: 1. Right carotid artery system: Patent without significant atherosclerotic plaque formation.   2. Left carotid artery  system: Less than 50% stenosis secondary to mild multifocal atherosclerotic plaque formation.   3.  Vertebral artery system: Patent with antegrade flow bilaterally.    BRAIN MRI MRA FINDINGS: MRI HEAD FINDINGS   Brain: Scattered small foci of restricted diffusion within the bilateral frontal parietal regions and the splenium of the corpus callosum, consistent with acute/subacute infarcts. Punctate focus of susceptibility artifact in the left occipital lobe suggesting small hemosiderin deposit. Scattered foci of T2 hyperintensity are seen within the white matter of the cerebral hemispheres, nonspecific, most likely related to small vessel ischemia. Mild parenchymal volume loss.   Vascular: Normal flow voids.   Skull and upper cervical spine: Normal marrow signal.   Sinuses/Orbits: Left lens surgery. Paranasal sinuses are essentially clear.   MRA HEAD FINDINGS   Anterior circulation: Luminal irregularity of the bilateral intracranial internal carotid arteries with mild-to-moderate stenosis at the distal left cavernous segment. The bilateral anterior cerebral arteries and middle cerebral arteries are widely patent with antegrade flow without high-grade flow-limiting stenosis or proximal branch occlusion. No intracranial aneurysm within the anterior circulation.   Posterior circulation: The vertebral arteries are widely patent with antegrade flow. The posterior inferior cerebral arteries are normal. Vertebrobasilar junction and basilar artery are widely patent with antegrade flow without evidence of basilar stenosis or aneurysm. Posterior cerebral arteries are normal bilaterally. No intracranial aneurysm within the posterior circulation.   Anatomic variants: None significant.   IMPRESSION: 1. Multiple punctate acute/sub infarcts in the bilateral anterior circulation suggesting embolic event. 2. No intracranial large vessel occlusion. 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 3. Mild chronic microvascular ischemic changes and parenchymal volume loss.     THE BRAIN MRI SCAN IS REVIEWED IN PERSON AND SHOWS MULTIPLE SMALL INFARCTS INVOLVING THE POSTERIOR FRONTAL LOBES BILATERALLY MOSTLY ON THE RIGHT SIDE. MRA SHOWS LUMINAL IRREGULARITY REDUCED FLOW INVOLVING THE RIGHT MCA PHYSICALLY THE M2 BRANCH AND DISTALLY.           Nathian Stencil A. Merlene Laughter, M.D.  Diplomate, Tax adviser of Psychiatry and Neurology ( Neurology). 04/06/2021, 6:01 PM

## 2021-04-06 NOTE — TOC Progression Note (Signed)
Transition of Care [facility]) - Progression Note    Patient Details  Name: Joshua Morales MRN: [id number] Date of Birth: Apr 18, 1946  Transition of Care [facility]) CM/SW Contact  Karn Cassis, [place] Phone Number: 04/06/2021, 8:14 AM  Clinical Narrative:  [facility] notification completed. Notification ID: [id number].          Expected Discharge Plan and Services                                                 Social Determinants of Health (SDOH) Interventions    Readmission Risk Interventions No flowsheet data found.

## 2021-04-06 NOTE — Evaluation (Signed)
Physical Therapy Evaluation Patient Details Name: Joshua Morales MRN: [id number] DOB: August 17, 1946 Today's Date: 04/06/2021   History of Present Illness  Joshua Morales  is a 75 y.o. male with past medical history relevant for uncontrolled DM, hypertension, alcohol abuse, tobacco abuse, alcohol-related dementia and agent orange exposure--presents by EMS with concerns of altered mentation  -Patient last known normal around 7 PM  -The family usually talkative this morning not really talking much  -In the ED no fevers   Clinical Impression  Patient has difficulty maintaining standing balance without AD with episode of falling back into bed during initial sit to stand, very unsteady on feet with narrow base of support and drifting left/right when attempting ambulation without AD, required use of RW for safety, but requires verbal cues for gripping RW with right hand with fair carryover, scissors legs when making turns with RW and at high risk for falls.  Patient tolerated sitting up in chair after therapy - LPN notified.  Patient will benefit from continued physical therapy in hospital and recommended venue below to increase strength, balance, endurance for safe ADLs and gait.      Follow Up Recommendations CIR    Equipment Recommendations  Rolling walker with 5" wheels    Recommendations for Other Services       Precautions / Restrictions Precautions Precautions: Fall Restrictions Weight Bearing Restrictions: No      Mobility  Bed Mobility Overal bed mobility: Needs Assistance Bed Mobility: Supine to Sit     Supine to sit: Supervision     General bed mobility comments: slightly labored movement, increased time    Transfers Overall transfer level: Needs assistance Equipment used: None;Rolling walker (2 wheeled) Transfers: Sit to/from [employer] Sit to Stand: Min assist Stand pivot transfers: Min assist       General transfer comment: very unsteady on feet,  unable to maintain standing balance and feel back into bed without AD, required use of RW for safety  Ambulation/Gait Ambulation/Gait assistance: Min assist Gait Distance (Feet): 50 Feet Assistive device: Rolling walker (2 wheeled) Gait Pattern/deviations: Decreased step length - right;Decreased step length - left;Decreased stride length;Drifts right/left;Scissoring;Ataxic Gait velocity: decreased   General Gait Details: slow labored cadence having to lean on nearby objects for support when not using AD, scissoring of legs and ataxic like movement when taking steps, had to use RW for safety demonstrating slow labored cadence requiring verbal cues to properly grip RW with right with fair carryover, very unsteady with scissor of legs when making turns with RW, limited mostly due to fatigue  Stairs            Wheelchair Mobility    Modified Rankin (Stroke Patients Only)       Balance Overall balance assessment: Needs assistance Sitting-balance support: Feet supported;No upper extremity supported Sitting balance-Leahy Scale: Fair Sitting balance - Comments: fair/good seated at EOB   Standing balance support: During functional activity;No upper extremity supported Standing balance-Leahy Scale: Poor Standing balance comment: fair using RW                             Pertinent Vitals/Pain Pain Assessment: No/denies pain    Home Living Family/patient expects to be discharged to:: Private residence Living Arrangements: Spouse/significant other;Children Joshua Morales) Available Help at Discharge: Family;Available 24 hours/day Type of Home: House Home Access: Stairs to enter Entrance Stairs-Rails: Right;Left;Can reach both Entrance Stairs-Number of Steps: 5 Home Layout: One level Home Equipment:  Grab bars - tub/shower      Prior Function Level of Independence: Independent         Comments: Tourist information centre manager, 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     Hand Dominance    Dominant Hand: Right    Extremity/Trunk Assessment   Upper Extremity Assessment Upper Extremity Assessment: Defer to OT evaluation    Lower Extremity Assessment Lower Extremity Assessment: Generalized weakness    Cervical / Trunk Assessment Cervical / Trunk Assessment: Normal  Communication   Communication: No difficulties  Cognition Arousal/Alertness: Awake/alert Behavior During Therapy: WFL for tasks assessed/performed Overall Cognitive Status: Within Functional Limits for tasks assessed                                        General Comments      Exercises     Assessment/Plan    PT Assessment Patient needs continued PT services  PT Problem List Decreased strength;Decreased activity tolerance;Decreased balance;Decreased mobility;Decreased coordination       PT Treatment Interventions DME instruction;Gait training;Stair training;Functional mobility training;Therapeutic activities;Therapeutic exercise;Patient/family education;Neuromuscular re-education;Balance training    PT Goals (Current goals can be found in the Care Plan section)  Acute Rehab PT Goals Patient Stated Goal: return home PT Goal Formulation: With patient Time For Goal Achievement: 04/20/21 Potential to Achieve Goals: Good    Frequency Min 4X/week   Barriers to discharge        Co-evaluation               AM-PAC PT "6 Clicks" Mobility  Outcome Measure Help needed turning from your back to your side while in a flat bed without using bedrails?: None Help needed moving from lying on your back to sitting on the side of a flat bed without using bedrails?: A Little Help needed moving to and from a bed to a chair (including a wheelchair)?: A Little Help needed standing up from a chair using your arms (e.g., wheelchair or bedside chair)?: A Little Help needed to walk in hospital room?: A Lot Help needed climbing 3-5 steps with a railing? : A Lot 6 Click Score: 17    End  of Session   Activity Tolerance: Patient tolerated treatment well;Patient limited by fatigue Patient left: in chair;with call bell/phone within reach;with chair alarm set Nurse Communication: Mobility status PT Visit Diagnosis: Unsteadiness on feet (R26.81);Other abnormalities of gait and mobility (R26.89);Muscle weakness (generalized) (M62.81)    Time: 3664-4034 PT Time Calculation (min) (ACUTE ONLY): 33 min   Charges:   PT Evaluation $PT Eval Moderate Complexity: 1 Mod PT Treatments $Therapeutic Activity: 23-37 mins        3:38 PM, 04/06/21 [name], MPT Physical Therapist with [facility] [phone] office [phone] mobile phone

## 2021-04-06 NOTE — Progress Notes (Addendum)
    [facility] has been requested to perform a transesophageal echocardiogram on Joshua Morales for CVA.  After careful review of history and examination, the risks and benefits of transesophageal echocardiogram have been explained including risks of esophageal damage, perforation (1:10,000 risk), bleeding, pharyngeal hematoma as well as other potential complications associated with conscious sedation including aspiration, arrhythmia, respiratory failure and death. Alternatives to treatment were discussed, questions were answered. Patient is willing to proceed. Patient is A&Ox3 and able to provide consent.   Labs today show Hgb stable at 10.7 with platelets at 122 K. BP has been stable with no requirement for pressor support.   TEE has been scheduled for 04/07/2021 at 1430 and will be moved up if scheduling allows. Keep NPO after midnight.   Ellsworth Lennox, PA-C  04/06/2021 5:04 PM

## 2021-04-06 NOTE — Procedures (Signed)
Patient Name: Joshua Morales  MRN: [id number]  Epilepsy Attending: Charlsie Quest  Referring Physician/Provider: Dr Mariea Clonts, [place] Date: 04/06/2021  Duration: 23.02 mins  Patient history: 75yo M with ams. EEG to evaluate for seizure  Level of alertness: Awake, asleep  AEDs during EEG study: None  Technical aspects: This EEG study was done with scalp electrodes positioned according to the 10-20 International system of electrode placement. Electrical activity was acquired at a sampling rate of 500Hz  and reviewed with a high frequency filter of 70Hz  and a low frequency filter of 1Hz . EEG data were recorded continuously and digitally stored.   Description: The posterior dominant rhythm consists of 7.5Hz  activity of moderate voltage (25-35 uV) seen predominantly in posterior head regions, symmetric and reactive to eye opening and eye closing. Sleep was characterized by vertex waves, sleep spindles (12 to 14 Hz), maximal frontocentral region.  EEG showed intermittent generalized  3 to 6 Hz theta-delta slowing. Hyperventilation and photic stimulation were not performed.     ABNORMALITY - Intermittent slow, generalized  IMPRESSION: This study is suggestive of mild diffuse encephalopathy, nonspecific etiology. No seizures or epileptiform discharges were seen throughout the recording.  Joshua Morales 

## 2021-04-06 NOTE — H&P (View-Only) (Signed)
    [facility] has been requested to perform a transesophageal echocardiogram on Joshua Morales for CVA.  After careful review of history and examination, the risks and benefits of transesophageal echocardiogram have been explained including risks of esophageal damage, perforation (1:10,000 risk), bleeding, pharyngeal hematoma as well as other potential complications associated with conscious sedation including aspiration, arrhythmia, respiratory failure and death. Alternatives to treatment were discussed, questions were answered. Patient is willing to proceed. Patient is A&Ox3 and able to provide consent.   Labs today show Hgb stable at 10.7 with platelets at 122 K. BP has been stable with no requirement for pressor support.   TEE has been scheduled for 04/07/2021 at 1430 and will be moved up if scheduling allows. Keep NPO after midnight.   Joshua Morales M Dyna Figuereo, PA-C  04/06/2021 5:04 PM    

## 2021-04-06 NOTE — Plan of Care (Signed)
  Problem: Acute Rehab PT Goals(only PT should resolve) Goal: Pt Will Go Supine/Side To Sit Outcome: Progressing Flowsheets (Taken 04/06/2021 1539) Pt will go Supine/Side to Sit:  with modified independence  Independently Goal: Patient Will Transfer Sit To/From Stand Outcome: Progressing Flowsheets (Taken 04/06/2021 1539) Patient will transfer sit to/from stand: with supervision Goal: Pt Will Transfer Bed To Chair/Chair To Bed Outcome: Progressing Flowsheets (Taken 04/06/2021 1539) Pt will Transfer Bed to Chair/Chair to Bed: with supervision Goal: Pt Will Ambulate Outcome: Progressing Flowsheets (Taken 04/06/2021 1539) Pt will Ambulate:  75 feet  with min guard assist  with rolling walker  with least restrictive assistive device   3:40 PM, 04/06/21 [name], MPT Physical Therapist with [facility] [phone] office [phone] mobile phone

## 2021-04-06 NOTE — Progress Notes (Signed)
*  PRELIMINARY RESULTS* Echocardiogram 2D Echocardiogram has been performed.  Stacey Drain 04/06/2021, 4:07 PM

## 2021-04-06 NOTE — Progress Notes (Signed)
SLP Cancellation Note  Patient Details Name: IZAAN KINGBIRD MRN: [id number] DOB: 1946-08-07   Cancelled treatment:       Reason Eval/Treat Not Completed: Patient at procedure or test/unavailable; Pt in room for procedure. SLP will check back for BSE later today as schedule permits.  Thank you,  Havery Moros, CCC-SLP [phone]    Milan Perkins 04/06/2021, 3:30 PM

## 2021-04-06 NOTE — Progress Notes (Signed)
EEG completed, results pending. 

## 2021-04-06 NOTE — Progress Notes (Signed)
Inpatient Diabetes Program Recommendations  AACE/ADA: New Consensus Statement on Inpatient Glycemic Control   Target Ranges:  Prepandial:   less than 140 mg/dL      Peak postprandial:   less than 180 mg/dL (1-2 hours)      Critically ill patients:  140 - 180 mg/dL  Results for GREER, KOEPPEN (MRN [id number]) as of 04/06/2021 12:23  Ref. Range 04/06/2021 05:23 04/06/2021 06:52 04/06/2021 07:28 04/06/2021 11:06  Glucose-Capillary Latest Ref Range: 70 - 99 mg/dL 54 (L) 129 (H) 119 (H) 188 (H)  Results for BANDY, HONAKER (MRN [id number]) as of 04/06/2021 12:23  Ref. Range 04/05/2021 10:59 04/05/2021 13:26 04/05/2021 14:00 04/05/2021 17:48 04/05/2021 18:34 04/05/2021 20:05  Glucose-Capillary Latest Ref Range: 70 - 99 mg/dL 539 (HH) 417 (H) 337 (H) 83 66 (L) 118 (H)   Results for ANTONINO, NIENHUIS (MRN [id number]) as of 04/06/2021 12:23  Ref. Range 04/05/2021 09:38  Hemoglobin A1C Latest Ref Range: 4.8 - 5.6 % 12.1 (H)   Review of Glycemic Control  Diabetes history: DM2 Outpatient Diabetes medications: Alogliptin, Metformin, Glipizide Current orders for Inpatient glycemic control: Semglee 15 units daily, Novolog 0-15 units TID with meals, Novolog 0-5 units QHS, Novolog 3 units TID with meals   Inpatient Diabetes Program Recommendations:     Insulin:  Noted Semglee decreased from 15 to 8 units daily today.  HbgA1C: A1C 12.1% on 04/05/21 indicating an average glucose of 301 mg/dl over the past 2-3 months.  NOTE: Communicated with Tori, LPN caring for patient today regarding educating patient's family on insulin and she notes that one of patient's daughters Lyndal Rainbow) is at bedside and she takes insulin herself. Diabetes coordinator working from [employer] so called Angelena over the phone. Angelena states that she takes insulin herself using insulin pens; she takes basal and bolus insulin. She reports that she would prefer patient be prescribed insulin pens if he is discharged on insulin and she plans to teach her  sister that lives with patient how to use an insulin pen to administer insulin. Angelena notes that her family has not been taking medications consistently and that he gets very agitated and gives family a hard time when they try to give him medications. She notes that patient sometimes does the same thing with glucose monitoring but he will usually let them check he glucose after some coaxing.  Encouraged her to ask PCP at [facility] about possibly prescribing FreeStyle Libre for glucose monitoring if her father gives them a hard time with glucose monitoring. Angelena has a [employer] herself so she is aware of how it works and how it is applied. Informed Angelena that her father's A1C is 12.1% on 04/05/21 indicating an average glucose of 301 mg/dl over the past 2-3 months. She stated she was not surprised. Discussed current insulin regimen and explained that if provider decides to discharge him on insulin, I will ask that insulin pens be prescribed. Informed Angelena that an insulin starter kit was ordered and she can have her sister read the information to learn more about insulin administration with an insulin pen. Also encouraged her to have her sister to look online for videos about insulin administration to learn more. Angelena verbalized understanding of information discussed and states that she has no questions at this time.  Thanks, Barnie Alderman, RN, MSN, CDE Diabetes Coordinator Inpatient Diabetes Program [phone] (Team Pager from 8am to 5pm)

## 2021-04-07 ENCOUNTER — Inpatient Hospital Stay (HOSPITAL_COMMUNITY): Payer: No Typology Code available for payment source

## 2021-04-07 ENCOUNTER — Inpatient Hospital Stay (HOSPITAL_COMMUNITY): Payer: No Typology Code available for payment source | Admitting: Anesthesiology

## 2021-04-07 ENCOUNTER — Encounter (HOSPITAL_COMMUNITY)
Admission: EM | Disposition: A | Discharge status: Home Health | Payer: Self-pay | Source: Home / Self Care | Attending: Family Medicine

## 2021-04-07 DIAGNOSIS — I639 Cerebral infarction, unspecified: Secondary | ICD-10-CM | POA: Diagnosis not present

## 2021-04-07 DIAGNOSIS — G9341 Metabolic encephalopathy: Secondary | ICD-10-CM | POA: Diagnosis not present

## 2021-04-07 DIAGNOSIS — I6389 Other cerebral infarction: Secondary | ICD-10-CM | POA: Diagnosis not present

## 2021-04-07 DIAGNOSIS — F1011 Alcohol abuse, in remission: Secondary | ICD-10-CM

## 2021-04-07 DIAGNOSIS — N179 Acute kidney failure, unspecified: Secondary | ICD-10-CM

## 2021-04-07 DIAGNOSIS — Z720 Tobacco use: Secondary | ICD-10-CM

## 2021-04-07 DIAGNOSIS — I10 Essential (primary) hypertension: Secondary | ICD-10-CM

## 2021-04-07 HISTORY — PX: BUBBLE STUDY: SHX6837

## 2021-04-07 HISTORY — PX: TEE WITHOUT CARDIOVERSION: SHX5443

## 2021-04-07 LAB — GLUCOSE, CAPILLARY
Glucose-Capillary: 139 mg/dL — ABNORMAL HIGH (ref 70–99)
Glucose-Capillary: 216 mg/dL — ABNORMAL HIGH (ref 70–99)
Glucose-Capillary: 226 mg/dL — ABNORMAL HIGH (ref 70–99)
Glucose-Capillary: 195 mg/dL — ABNORMAL HIGH (ref 70–99)
Glucose-Capillary: 310 mg/dL — ABNORMAL HIGH (ref 70–99)

## 2021-04-07 LAB — LIPID PANEL
Cholesterol: 167 mg/dL (ref 0–200)
HDL: 39 mg/dL — ABNORMAL LOW
LDL Cholesterol: 115 mg/dL — ABNORMAL HIGH (ref 0–99)
Total CHOL/HDL Ratio: 4.3 ratio
Triglycerides: 66 mg/dL
VLDL: 13 mg/dL (ref 0–40)

## 2021-04-07 SURGERY — ECHOCARDIOGRAM, TRANSESOPHAGEAL
Anesthesia: General

## 2021-04-07 MED ORDER — APIXABAN 2.5 MG PO TABS
2.5000 mg | ORAL_TABLET | Freq: Two times a day (BID) | ORAL | Status: DC
  Administered 2021-04-07 – 2021-04-08 (×2): 2.5 mg via ORAL
  Filled 2021-04-07 (×3): qty 1

## 2021-04-07 MED ORDER — SODIUM CHLORIDE BACTERIOSTATIC 0.9 % IJ SOLN
INTRAMUSCULAR | Status: AC
  Filled 2021-04-07: qty 10

## 2021-04-07 MED ORDER — PROPOFOL 500 MG/50ML IV EMUL
INTRAVENOUS | Status: DC | PRN
  Administered 2021-04-07: 50 mg via INTRAVENOUS
  Administered 2021-04-07: 100 ug/kg/min via INTRAVENOUS

## 2021-04-07 MED ORDER — SODIUM CHLORIDE BACTERIOSTATIC 0.9 % IJ SOLN
INTRAMUSCULAR | Status: DC | PRN
  Administered 2021-04-07: 10 mL via INTRAVENOUS

## 2021-04-07 MED ORDER — PROPOFOL 10 MG/ML IV BOLUS
INTRAVENOUS | Status: AC
  Filled 2021-04-07: qty 40

## 2021-04-07 MED ORDER — CHLORHEXIDINE GLUCONATE 0.12 % MT SOLN
15.0000 mL | Freq: Once | OROMUCOSAL | Status: AC
  Administered 2021-04-07: 15 mL via OROMUCOSAL

## 2021-04-07 MED ORDER — KETAMINE HCL 50 MG/5ML IJ SOSY
PREFILLED_SYRINGE | INTRAMUSCULAR | Status: AC
  Filled 2021-04-07: qty 5

## 2021-04-07 MED ORDER — INSULIN ASPART 100 UNIT/ML IJ SOLN
0.0000 [IU] | Freq: Three times a day (TID) | INTRAMUSCULAR | Status: DC
  Administered 2021-04-08: 5 [IU] via SUBCUTANEOUS
  Administered 2021-04-08: 3 [IU] via SUBCUTANEOUS

## 2021-04-07 MED ORDER — KETAMINE HCL 10 MG/ML IJ SOLN
INTRAMUSCULAR | Status: DC | PRN
  Administered 2021-04-07: 20 mg via INTRAVENOUS

## 2021-04-07 MED ORDER — INSULIN ASPART 100 UNIT/ML IJ SOLN
0.0000 [IU] | Freq: Every day | INTRAMUSCULAR | Status: DC
Start: 2021-04-07 — End: 2021-04-08
  Administered 2021-04-07: 4 [IU] via SUBCUTANEOUS

## 2021-04-07 MED ORDER — ORAL CARE MOUTH RINSE: 15.0000 mL | Freq: Once | OROMUCOSAL | Status: AC

## 2021-04-07 MED ORDER — INSULIN GLARGINE-YFGN 100 UNIT/ML ~~LOC~~ SOLN
8.0000 [IU] | Freq: Every day | SUBCUTANEOUS | Status: DC
  Administered 2021-04-08: 8 [IU] via SUBCUTANEOUS
  Filled 2021-04-07 (×2): qty 0.08

## 2021-04-07 MED ORDER — INSULIN ASPART 100 UNIT/ML IJ SOLN
2.0000 [IU] | Freq: Three times a day (TID) | INTRAMUSCULAR | Status: DC
  Administered 2021-04-08 (×2): 2 [IU] via SUBCUTANEOUS

## 2021-04-07 MED ORDER — LACTATED RINGERS IV SOLN: INTRAVENOUS | Status: DC

## 2021-04-07 NOTE — Progress Notes (Signed)
ANTICOAGULATION CONSULT NOTE - Initial Consult  Pharmacy Consult for Eliquis Indication: atrial fibrillation  No Known Allergies  Patient Measurements: Height: 5\' 10"  (177.8 cm) Weight: 56.7 kg (125 lb) IBW/kg (Calculated) : 73  Vital Signs: Temp: 97.6 F (36.4 C) (08/03 1549) Temp Source: Oral (08/03 1549) BP: 163/85 (08/03 1549) Pulse Rate: 53 (08/03 1549)  Labs: Recent Labs    04/05/21 0938 04/06/21 0634  HGB 12.0* 10.7*  HCT 34.5* 32.7*  PLT 221 122*  CREATININE 2.19* 1.81*  1.80*    Estimated Creatinine Clearance: 29.2 mL/min (A) (by C-G formula based on SCr of 1.81 mg/dL (H)).   Medical History: Past Medical History:  Diagnosis Date   Agent orange exposure    Dementia (HCC)    Diabetes mellitus without complication (HCC)    Hypertension     Medications:  Medications Prior to Admission  Medication Sig Dispense Refill Last Dose   ACIDOPHILUS LACTOBACILLUS PO Take 2 capsules by mouth in the morning and at bedtime.   Past Month   atenolol (TENORMIN) 50 MG tablet Take 50 mg by mouth daily.   Past Month   atorvastatin (LIPITOR) 40 MG tablet Take 40 mg by mouth daily.   Past Month   Cholecalciferol (D3-1000) 25 MCG (1000 UT) tablet Take 2,000 Units by mouth daily.   Past Month   hydrOXYzine (ATARAX/VISTARIL) 10 MG tablet Take 10 mg by mouth 2 (two) times daily as needed for itching.   Past Month   metFORMIN (GLUCOPHAGE-XR) 500 MG 24 hr tablet Take 1,000 mg by mouth in the morning and at bedtime.   Past Month   metoprolol tartrate (LOPRESSOR) 25 MG tablet Take 25 mg by mouth 2 (two) times daily.   Past Month   mirtazapine (REMERON) 15 MG tablet Take 15 mg by mouth at bedtime.   Past Month    Assessment: Mr. Streight presented today for surgery, with diagnosis of stroke. TEE was done and showed left atrial appendage thrombus at TEE,  Patient was in sinus rhythm, and MD said that he has had undocumented paroxysmal atrial fibrillation that gave him a left atrial  appendage thrombus and treat for atrial fib.  He is thrombocytopenic and has a history of alcohol abuse and dementia.  He is at risk with anticoagulation but no other choice.  This is not an arterial or venous clot per MD and will treat per afib dosing. 75 yo, 56.7kg and Scr 1.8  Goal of Therapy:  Monitor platelets by anticoagulation protocol: Yes   Plan:  Eliquis 2.5mg  po bid Educate on eliquis Monitor for S/S of bleeding  65, BS Elder Cyphers, BCPS Clinical Pharmacist Pager [phone] 04/07/2021,4:22 PM

## 2021-04-07 NOTE — Anesthesia Postprocedure Evaluation (Signed)
Anesthesia Post Note  Patient: Joshua Morales  Procedure(s) Performed: TRANSESOPHAGEAL ECHOCARDIOGRAM (TEE) BUBBLE STUDY  Patient location during evaluation: PACU Anesthesia Type: General Level of consciousness: awake and alert and oriented Pain management: pain level not controlled Vital Signs Assessment: post-procedure vital signs reviewed and stable Respiratory status: spontaneous breathing and respiratory function stable Cardiovascular status: blood pressure returned to baseline and stable Postop Assessment: no apparent nausea or vomiting Anesthetic complications: no   No notable events documented.   Last Vitals:  Vitals:   04/07/21 1500 04/07/21 1515  BP: (!) 99/57 132/76  Pulse: 60 66  Resp: 13 (!) 9  Temp: (!) 36.3 C   SpO2: 99% 96%    Last Pain:  Vitals:   04/07/21 1515  TempSrc:   PainSc: 0-No pain                 Filippa Yarbough C Matilde Markie

## 2021-04-07 NOTE — Progress Notes (Signed)
Physical Therapy Treatment Patient Details Name: Joshua Morales MRN: [id number] DOB: 05-26-1946 Today's Date: 04/07/2021    History of Present Illness Joshua Morales  is a 75 y.o. male with past medical history relevant for uncontrolled DM, hypertension, alcohol abuse, tobacco abuse, alcohol-related dementia and agent orange exposure--presents by EMS with concerns of altered mentation, right side weakness. MRI confirms bilateral acute infarcts    PT Comments    Patient demonstrates much improvement for functional mobility and gait and able to ambulate in room/hallways without use of AD without loss of balance and demonstrates fair/good return for going up down/steps having to use bilateral side rails.  Patient has to occasionally lean on side rails during ambulation in hallway once fatigued and tolerated staying up in chair with his daughter present in room after therapy.  Patient will benefit from continued physical therapy in hospital and recommended venue below to increase strength, balance, endurance for safe ADLs and gait.    Follow Up Recommendations  Home health PT;Supervision - Intermittent;Supervision for mobility/OOB     Equipment Recommendations  Cane    Recommendations for Other Services       Precautions / Restrictions Precautions Precautions: Fall Restrictions Weight Bearing Restrictions: No    Mobility  Bed Mobility Overal bed mobility: Modified Independent Bed Mobility: Supine to Sit     Supine to sit: Supervision     General bed mobility comments: slightly labored movement, increased time    Transfers Overall transfer level: Modified independent Equipment used: None Transfers: Sit to/from [employer] Sit to Stand: Min assist Stand pivot transfers: Min guard       General transfer comment: good return for transfers without use of AD, increased time  Ambulation/Gait Ambulation/Gait assistance: Supervision Gait Distance (Feet): 120  Feet Assistive device: None Gait Pattern/deviations: Decreased step length - right;Decreased step length - left;Decreased stride length Gait velocity: decreased   General Gait Details: increased endurance/distance with slightly labored cadence without loss of balance, no scissoring or drifting left/right during ambulaiton, occasional leaning on side rail once fatigued   Stairs Stairs: Yes Stairs assistance: Supervision Stair Management: Two rails;Step to pattern;Sideways Number of Stairs: 4 General stair comments: demonstrates fair/good return for going up/down steps using 2 side rails most of time, has to step down sideways, no loss of balance   Wheelchair Mobility    Modified Rankin (Stroke Patients Only)       Balance Overall balance assessment: Needs assistance Sitting-balance support: Feet supported;No upper extremity supported Sitting balance-Leahy Scale: Good Sitting balance - Comments: seated at EOB   Standing balance support: During functional activity;No upper extremity supported Standing balance-Leahy Scale: Fair Standing balance comment: fair/good static standing, fair dynamic without loss of balance                            Cognition Arousal/Alertness: Awake/alert Behavior During Therapy: WFL for tasks assessed/performed Overall Cognitive Status: Within Functional Limits for tasks assessed                                        Exercises      General Comments        Pertinent Vitals/Pain Pain Assessment: No/denies pain    Home Living Family/patient expects to be discharged to:: Private residence Living Arrangements: Spouse/significant other;Children (ex-wife) Available Help at Discharge: Family;Available 24 hours/day Type of Home:  House Home Access: Stairs to enter Entrance Stairs-Rails: Right;Left;Can reach both Home Layout: One level Home Equipment: None      Prior Function Level of Independence: Independent       Comments: Tourist information centre manager, 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, independent in ADLs   PT Goals (current goals can now be found in the care plan section) Acute Rehab PT Goals Patient Stated Goal: return home PT Goal Formulation: With patient/family Time For Goal Achievement: 04/09/21 Potential to Achieve Goals: Good Progress towards PT goals: Progressing toward goals    Frequency    Min 4X/week      PT Plan Discharge plan needs to be updated    Co-evaluation              AM-PAC PT "6 Clicks" Mobility   Outcome Measure  Help needed turning from your back to your side while in a flat bed without using bedrails?: None Help needed moving from lying on your back to sitting on the side of a flat bed without using bedrails?: None Help needed moving to and from a bed to a chair (including a wheelchair)?: A Little Help needed standing up from a chair using your arms (e.g., wheelchair or bedside chair)?: None Help needed to walk in hospital room?: A Little Help needed climbing 3-5 steps with a railing? : A Little 6 Click Score: 21    End of Session   Activity Tolerance: Patient tolerated treatment well Patient left: in chair;with call bell/phone within reach;with family/visitor present Nurse Communication: Mobility status PT Visit Diagnosis: Unsteadiness on feet (R26.81);Other abnormalities of gait and mobility (R26.89);Muscle weakness (generalized) (M62.81)     Time: 0630-1601 PT Time Calculation (min) (ACUTE ONLY): 21 min  Charges:  $Gait Training: 8-22 mins $Therapeutic Activity: 8-22 mins                     8:47 AM, 04/07/21 Joshua Morales, MPT Physical Therapist with [facility] [phone] office [phone] mobile phone

## 2021-04-07 NOTE — Anesthesia Preprocedure Evaluation (Signed)
Anesthesia Evaluation  Patient identified by MRN, date of birth, ID band Patient awake    Reviewed: Allergy & Precautions, NPO status , Patient's Chart, lab work & pertinent test results, reviewed documented beta blocker date and time   Airway Mallampati: II  TM Distance: >3 FB Neck ROM: Full    Dental  (+) Edentulous Upper, Edentulous Lower   Pulmonary Current Smoker and Patient abstained from smoking.,    Pulmonary exam normal breath sounds clear to auscultation       Cardiovascular hypertension, Pt. on home beta blockers and Pt. on medications Normal cardiovascular exam Rhythm:Regular Rate:Normal  1. Left ventricular ejection fraction, by estimation, is 50 to 55%. The left ventricle has low normal function. The left ventricle has no regional wall motion abnormalities. There is moderate to severe left ventricular hypertrophy. Left ventricular diastolic parameters are consistent with Grade I diastolic dysfunction (impaired relaxation).  2. Right ventricular systolic function is normal. The right ventricular size is normal. Tricuspid regurgitation signal is inadequate for assessing PA pressure.  3. Left atrial size was mildly dilated.  4. There is trivial pericardial effusion anterior to the right ventricle.  5. The mitral valve is myxomatous. Trivial mitral valve regurgitation.  6. The aortic valve is tricuspid. Aortic valve regurgitation is not visualized. Mild aortic valve sclerosis is present, with no evidence of aortic valve stenosis.  7. The inferior vena cava is normal in size with greater than 50% respiratory variability, suggesting right atrial pressure of 3 mmHg.    Neuro/Psych PSYCHIATRIC DISORDERS Dementia CVA    GI/Hepatic negative GI ROS, (+)     substance abuse  alcohol use,   Endo/Other  diabetes, Well Controlled, Type 2, Oral Hypoglycemic Agents  Renal/GU Renal disease     Musculoskeletal   Abdominal    Peds  Hematology   Anesthesia Other Findings   Reproductive/Obstetrics                            Anesthesia Physical Anesthesia Plan  ASA: 4  Anesthesia Plan: General   Post-op Pain Management:    Induction: Intravenous  PONV Risk Score and Plan: Propofol infusion  Airway Management Planned: Nasal Cannula and Natural Airway  Additional Equipment:   Intra-op Plan:   Post-operative Plan:   Informed Consent: I have reviewed the patients History and Physical, chart, labs and discussed the procedure including the risks, benefits and alternatives for the proposed anesthesia with the patient or authorized representative who has indicated his/her understanding and acceptance.     Dental advisory given  Plan Discussed with: CRNA and Surgeon  Anesthesia Plan Comments:        Anesthesia Quick Evaluation

## 2021-04-07 NOTE — Progress Notes (Signed)
*  PRELIMINARY RESULTS* Echocardiogram Echocardiogram Transesophageal has been performed.  Stacey Drain 04/07/2021, 3:11 PM

## 2021-04-07 NOTE — Transfer of Care (Signed)
Immediate Anesthesia Transfer of Care Note  Patient: Joshua Morales  Procedure(s) Performed: TRANSESOPHAGEAL ECHOCARDIOGRAM (TEE) BUBBLE STUDY  Patient Location: PACU  Anesthesia Type:MAC  Level of Consciousness: drowsy, patient cooperative and responds to stimulation  Airway & Oxygen Therapy: Patient Spontanous Breathing  Post-op Assessment: Report given to RN, Post -op Vital signs reviewed and stable and Patient moving all extremities X 4  Post vital signs: Reviewed and stable  Last Vitals:  Vitals Value Taken Time  BP 99/57 04/07/21 1500  Temp    Pulse 60 04/07/21 1502  Resp 12 04/07/21 1502  SpO2 99 % 04/07/21 1502  Vitals shown include unvalidated device data.  Last Pain:  Vitals:   04/07/21 1420  TempSrc: Oral  PainSc: 0-No pain      Patients Stated Pain Goal: 5 (24/82/50 0370)  Complications: No notable events documented.

## 2021-04-07 NOTE — CV Procedure (Addendum)
Transesophageal echocardiogram  Indication: Stroke  Description of procedure: Informed consent was obtained from patient's daughter and he was taken to the procedure suite where a timeout was performed.  He was placed in left lateral decubitus position, viscous lidocaine utilized, and bite-block in place.  Deep sedation was achieved via use of propofol per the anesthesia service, please refer to their records for dose administration and monitoring.  A multiplane transesophageal echocardiographic probe was inserted into the esophagus and multiple images were obtained.  Findings are as follows:   1. Left ventricular ejection fraction, by estimation, is 60 to 65%. The  left ventricle has normal function. The left ventricle has no regional  wall motion abnormalities. There is severe left ventricular hypertrophy.   2. Right ventricular systolic function is normal. The right ventricular  size is normal.   3. Left atrial size was mildly dilated. 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 The LAA emptying velocity was 60 cm/s.   4. There is a trivial pericardial effusion anterior to the right  ventricle.   5. 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 Trivial mitral valve regurgitation.   6. The aortic valve is tricuspid. Aortic valve regurgitation is not  visualized.   7. 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   8. Agitated saline contrast bubble study was negative, with no evidence  of any interatrial shunt.  Patient was hemodynamically stable throughout and there were no immediate complications.  Results were discussed with Dr. Laural Benes.  Although the patient is in sinus rhythm at this time, the presence of a left atrial appendage thrombus would suggest significantly increased likelihood of paroxysmal atrial fibrillation that has just not yet been documented.   Anticoagulation is indicated at this time for further stroke prophylaxis.  Cardiac monitor could be obtained, but unlikely to alter recommendations for anticoagulation based on the present findings.  Rather than antiplatelet regimen, suggest DOAC such as Eliquis.  Would review with neurology as to whether there would be any additional benefit to a short-term course of aspirin as well.  He does have thrombocytopenia however and will have an increased risk of bleeding.  It is not clear that he needs any loading dose of anticoagulation based on the present situation, Eliquis 2.5 mg twice daily would be indicated based on his current creatinine and weight.  Jonelle Sidle, M.D., F.A.C.C.

## 2021-04-07 NOTE — Interval H&P Note (Signed)
History and Physical Interval Note:  04/07/2021 2:15 PM  ADRIN Morales  has presented today for surgery, with the diagnosis of stroke.  The various methods of treatment have been discussed with the patient and family. After consideration of risks, benefits and other options for treatment, the patient has consented to  Procedure(s): TRANSESOPHAGEAL ECHOCARDIOGRAM (TEE) (N/A) as a surgical intervention.  The patient's history has been reviewed, patient examined, no change in status, stable for surgery.  I have reviewed the patient's chart and labs.  Questions were answered to the patient's satisfaction.     [name]

## 2021-04-07 NOTE — Progress Notes (Signed)
SLP Cancellation Note  Patient Details Name: Joshua Morales MRN: [id number] DOB: [birth year]   Cancelled treatment:       Reason Eval/Treat Not Completed: Other (comment) (Pt is NPO for TEE at 2:30 PM today. SLP will check back tomorrow for BSE)  Thank you,  Joshua Morales, CCC-SLP [phone]  Joshua Morales 04/07/2021, 10:11 AM

## 2021-04-07 NOTE — Progress Notes (Addendum)
Patient Demographics:    Joshua Morales, is a 75 y.o. male, DOB - May 20, 1946, MCN:[id number]  Admit date - 04/05/2021   Admitting Physician Courage Denton Brick, MD  Outpatient Primary MD for the patient is [facility]  LOS - 1  Chief Complaint  Patient presents with   Altered Mental Status        Subjective:    Joshua Morales today has no fevers, no emesis,  No chest pain,   ---Patient's daughter Joshua Morales at bedside ---Patient is more awake, more coherent, speech and swallowing is improved, able to move all 4 extremities  Assessment  & Plan :    Principal Problem:   Acute CVA (cerebrovascular accident)/Multiple punctate acute/sub infarcts in the bilateral anterior Active Problems:   AKI (acute kidney injury) ([place])   Diabetes mellitus type 2, uncontrolled ([place])   HTN (hypertension)   Tobacco abuse   Alcohol abuse, 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 male with past medical history relevant for uncontrolled DM, hypertension, alcohol abuse, tobacco abuse, alcohol-related dementia and agent orange exposure admitted on 04/05/2021 with generalized weakness and altered mentation and found to have multiple acute and subacute bilateral strokes consistent with embolic type phenomena  A/P  1)Acute CVA/Multiple punctate acute/sub infarcts in the bilateral anterior circulation suggesting embolic event--- EKG today is NSR ---- Place on telemetry monitored unit,  --treat empirically with aspirin and  Lipitor  Rx,  MRI Brain-- confirms embolic strokes as above ---neurologist Dr. Merlene Laughter recommended TEE ---TEE completed on 8/3 with findings of atrial thrombus: started full anticoagulation with apixaban  echocardiogram with EF of 50 to 55 %, with LVH and Grade 1 Diastolic  Dysfunction  -carotid artery Dopplers -without hemodynamically significant stenosis -MRA head without LVO -Official neurology consult pending.   -PT/OT eval pending, speech eval pending, We will allow some permissive hypertension in view of possible acute stroke, avoid precipitous drop in blood pressure.  TSH is 0.87  -A1c 12.1 Fasting lipid profile--pending -Aspirin and Plavix discontinued for now--and patient has been started on Eliquis given concerns for embolic stroke and atrial thrombus -TEE noted above.  -No need to do a 30-day event monitor given that it would not change the current recommendation of full anticoagulation.  # Maximize blood glucose control with insulin coverage (goal FS 60-180) #Please maintain euthermia. Tylenol prn for temp >100.4 #Telemetry monitoring -EEG without epileptiform findings -UDS negative B12 and folate WNL  2)AKI VS Possible CKD--- --creatinine on admission 2.19 baseline not available  -Creatinine down to 1.8 with hydration avoid Nephrotoxic agents / dehydration  / hypotension  3)Acute Versus chronic anemia --  hemoglobin is 12.0 >> 10.7,  platelets 221 >> 122 monitor closely, check stool for occult blood and transfuse as indicated  4)history of alcoholic dementia--- supportive care, Aricept, Namenda and Seroquel as ordered--B12 and folate WNL  5) tobacco abuse--nicotine patch as ordered  6)DM2-uncontrolled DM with hyperglycemia--- on admission patient did not meet criteria for DKA -A1c is 12.1 reflecting uncontrolled DM with hyperglycemia PTA -IV fluids and subcu insulin as ordered   7) history of EtOH abuse--- longstanding history of heavy alcohol use -Quit  drinking in March 2022 after DUI -c/n thiamine folic acid on multivitamin -DTs unlikely given lack of EtOH use recently  Disposition/Need for in-Hospital Stay- plan to discharge tomorrow if remains stable   Status is: Inpatient  Remains inpatient appropriate because: Please see  disposition above  Disposition: The patient is from: Home              Anticipated d/c is to:  Home with [facility]              Anticipated d/c date is: 1 day              Patient currently is not medically stable to d/c. Barriers: Not Clinically Stable-   Code Status :  -  Code Status: Full Code   Family Communication:   (patient is alert, awake and coherent)  Discussed with daughter Joshua Morales at bedside Consults  :  Neurology/Cardiology  DVT Prophylaxis  :   - SCDs   apixaban (ELIQUIS) tablet 2.5 mg Start: 04/07/21 1700 SCDs Start: 04/05/21 1944 Place TED hose Start: 04/05/21 1944 apixaban (ELIQUIS) tablet 2.5 mg   Lab Results  Component Value Date   PLT 122 (L) 04/06/2021   Inpatient Medications  Scheduled Meds:  apixaban  2.5 mg Oral BID   atenolol  25 mg Oral QHS   atorvastatin  40 mg Oral Daily   dextrose  50 mL Intravenous Once   donepezil  5 mg Oral QHS   folic acid  1 mg Oral Daily   insulin aspart  0-15 Units Subcutaneous TID WC   insulin aspart  0-5 Units Subcutaneous QHS   insulin glargine-yfgn  6 Units Subcutaneous Daily   insulin starter kit- pen needles  1 kit Other Once   living well with diabetes book   Does not apply Once   memantine  5 mg Oral BID   multivitamin with minerals  1 tablet Oral Daily   nicotine  14 mg Transdermal Daily   QUEtiapine  25 mg Oral QHS   sodium chloride flush  3 mL Intravenous Q12H   sodium chloride flush  3 mL Intravenous Q12H   thiamine  100 mg Oral Daily   traZODone  50 mg Oral QHS   Continuous Infusions:  sodium chloride 50 mL/hr at 04/07/21 1548   sodium chloride Stopped (04/07/21 1515)   PRN Meds:.sodium chloride, acetaminophen **OR** acetaminophen, bisacodyl, dextrose, hydrOXYzine, labetalol, ondansetron **OR** ondansetron (ZOFRAN) IV, polyethylene glycol, sodium chloride flush  Anti-infectives (From admission, onward)    None        Objective:   Vitals:   04/07/21 1500 04/07/21 1515 04/07/21 1530 04/07/21 1549   BP: (!) 99/57 132/76 (!) 151/76 (!) 163/85  Pulse: 60 66  (!) 53  Resp: 13 (!) 9 12 15   Temp: (!) 97.4 F (36.3 C)  97.9 F (36.6 C) 97.6 F (36.4 C)  TempSrc:    Oral  SpO2: 99% 96% 99% 100%  Weight:      Height:        Wt Readings from Last 3 Encounters:  04/05/21 56.7 kg    Intake/Output Summary (Last 24 hours) at 04/07/2021 1621 Last data filed at 04/07/2021 1456 Gross per 24 hour  Intake 1260 ml  Output 650 ml  Net 610 ml   Physical Exam Gen:- awake, alert, cooperative, NAD.   HEENT:- NCAT, PERRLA.  Neck-Supple Neck,No JVD,.  Lungs-  no increased work of breathing.  CV-  normal s1, s2 sounds.  No M/R/G.  Abd- soft, nondistended, nontender, no masses.     Extremity/Skin:- No  edema, pedal pulses present  Psych-affect is appropriate, oriented x3 Neuro-generalized weakness, overall patient appears more awake, more responsive, more interactive today compared to 04/05/2021, moving all extremities on 04/06/2021   Data Review:   Micro Results Recent Results (from the past 240 hour(s))  Resp Panel by RT-PCR (Flu A&B, Covid) Nasopharyngeal Swab     Status: None   Collection Time: 04/05/21 10:29 AM   Specimen: Nasopharyngeal Swab; Nasopharyngeal(NP) swabs in vial transport medium  Result Value Ref Range Status   SARS Coronavirus 2 by RT PCR NEGATIVE NEGATIVE Final    Comment: (NOTE) SARS-CoV-2 target nucleic acids are NOT DETECTED.  The SARS-CoV-2 RNA is generally detectable in upper respiratory specimens during the acute phase of infection. The lowest concentration of SARS-CoV-2 viral copies this assay can detect is 138 copies/mL. A negative result does not preclude SARS-Cov-2 infection and should not be used as the sole basis for treatment or other patient management decisions. A negative result may occur with  improper specimen collection/handling, submission of specimen other than nasopharyngeal swab, presence of viral mutation(s) within the areas targeted by this  assay, and inadequate number of viral copies(<138 copies/mL). A negative result must be combined with clinical observations, patient history, and epidemiological information. The expected result is Negative.  Fact Sheet for Patients:  EntrepreneurPulse.com.au  Fact Sheet for Healthcare Providers:  IncredibleEmployment.be  This test is no t yet approved or cleared by the [place] FDA and  has been authorized for detection and/or diagnosis of SARS-CoV-2 by FDA under an Emergency Use Authorization (EUA). This EUA will remain  in effect (meaning this test can be used) for the duration of the COVID-19 declaration under Section 564(b)(1) of the Act, 21 U.S.C.section 360bbb-3(b)(1), unless the authorization is terminated  or revoked sooner.       Influenza A by PCR NEGATIVE NEGATIVE Final   Influenza B by PCR NEGATIVE NEGATIVE Final    Comment: (NOTE) The Xpert Xpress SARS-CoV-2/FLU/RSV plus assay is intended as an aid in the diagnosis of influenza from Nasopharyngeal swab specimens and should not be used as a sole basis for treatment. Nasal washings and aspirates are unacceptable for Xpert Xpress SARS-CoV-2/FLU/RSV testing.  Fact Sheet for Patients: EntrepreneurPulse.com.au  Fact Sheet for Healthcare Providers: IncredibleEmployment.be  This test is not yet approved or cleared by the [place] FDA and has been authorized for detection and/or diagnosis of SARS-CoV-2 by FDA under an Emergency Use Authorization (EUA). This EUA will remain in effect (meaning this test can be used) for the duration of the COVID-19 declaration under Section 564(b)(1) of the Act, 21 U.S.C. section 360bbb-3(b)(1), unless the authorization is terminated or revoked.  Performed at [facility], [address], [place], [place] [address]    Radiology Reports CT HEAD WO CONTRAST  Result Date: 04/05/2021 CLINICAL DATA:  Mental  status change, unknown cause. Awoke today with difficulty speaking. Dementia EXAM: CT HEAD WITHOUT CONTRAST TECHNIQUE: Contiguous axial images were obtained from the base of the skull through the vertex without intravenous contrast. COMPARISON:  None. FINDINGS: Brain: Generalized atrophy. There may be some frontal and temporal predominance. Chronic small-vessel ischemic change of the hemispheric white matter. No sign of acute infarction, mass lesion, hemorrhage, hydrocephalus or extra-axial collection. Vascular: There is atherosclerotic calcification of the major vessels at the base of the brain. Skull: Negative Sinuses/Orbits: Clear/normal Other: None IMPRESSION: No acute brain finding. Brain atrophy, possibly with some frontal and  temporal predominance. Chronic small-vessel change of the hemispheric white matter. Electronically Signed   By: Nelson Chimes M.D.   On: 04/05/2021 11:03   MR ANGIO HEAD WO CONTRAST  Result Date: 04/06/2021 CLINICAL DATA:  Mental status change, unknown cause. EXAM: MRI HEAD WITHOUT CONTRAST MRA HEAD WITHOUT CONTRAST TECHNIQUE: Multiplanar, multi-echo pulse sequences of the brain and surrounding structures were acquired without intravenous contrast. Angiographic images of the Circle of Willis were acquired using MRA technique without intravenous contrast. COMPARISON:  Head CT April 05, 2021 FINDINGS: MRI HEAD FINDINGS Brain: Scattered small foci of restricted diffusion within the bilateral frontal parietal regions and the splenium of the corpus callosum, consistent with acute/subacute infarcts. Punctate focus of susceptibility artifact in the left occipital lobe suggesting small hemosiderin deposit. Scattered foci of T2 hyperintensity are seen within the white matter of the cerebral hemispheres, nonspecific, most likely related to small vessel ischemia. Mild parenchymal volume loss. Vascular: Normal flow voids. Skull and upper cervical spine: Normal marrow signal. Sinuses/Orbits: Left  lens surgery. Paranasal sinuses are essentially clear. MRA HEAD FINDINGS Anterior circulation: Luminal irregularity of the bilateral intracranial internal carotid arteries with mild-to-moderate stenosis at the distal left cavernous segment. The bilateral anterior cerebral arteries and middle cerebral arteries are widely patent with antegrade flow without high-grade flow-limiting stenosis or proximal branch occlusion. No intracranial aneurysm within the anterior circulation. Posterior circulation: The vertebral arteries are widely patent with antegrade flow. The posterior inferior cerebral arteries are normal. Vertebrobasilar junction and basilar artery are widely patent with antegrade flow without evidence of basilar stenosis or aneurysm. Posterior cerebral arteries are normal bilaterally. No intracranial aneurysm within the posterior circulation. Anatomic variants: None significant. IMPRESSION: 1. Multiple punctate acute/sub infarcts in the bilateral anterior circulation suggesting embolic event. 2. No intracranial large vessel occlusion. Mild-to-moderate stenosis of the cavernous left ICA. 3. Mild chronic microvascular ischemic changes and parenchymal volume loss. Electronically Signed   By: Pedro Earls M.D.   On: 04/06/2021 11:26   MR BRAIN WO CONTRAST  Result Date: 04/06/2021 CLINICAL DATA:  Mental status change, unknown cause. EXAM: MRI HEAD WITHOUT CONTRAST MRA HEAD WITHOUT CONTRAST TECHNIQUE: Multiplanar, multi-echo pulse sequences of the brain and surrounding structures were acquired without intravenous contrast. Angiographic images of the Circle of Willis were acquired using MRA technique without intravenous contrast. COMPARISON:  Head CT April 05, 2021 FINDINGS: MRI HEAD FINDINGS Brain: Scattered small foci of restricted diffusion within the bilateral frontal parietal regions and the splenium of the corpus callosum, consistent with acute/subacute infarcts. Punctate focus of  susceptibility artifact in the left occipital lobe suggesting small hemosiderin deposit. Scattered foci of T2 hyperintensity are seen within the white matter of the cerebral hemispheres, nonspecific, most likely related to small vessel ischemia. Mild parenchymal volume loss. Vascular: Normal flow voids. Skull and upper cervical spine: Normal marrow signal. Sinuses/Orbits: Left lens surgery. Paranasal sinuses are essentially clear. MRA HEAD FINDINGS Anterior circulation: Luminal irregularity of the bilateral intracranial internal carotid arteries with mild-to-moderate stenosis at the distal left cavernous segment. The bilateral anterior cerebral arteries and middle cerebral arteries are widely patent with antegrade flow without high-grade flow-limiting stenosis or proximal branch occlusion. No intracranial aneurysm within the anterior circulation. Posterior circulation: The vertebral arteries are widely patent with antegrade flow. The posterior inferior cerebral arteries are normal. Vertebrobasilar junction and basilar artery are widely patent with antegrade flow without evidence of basilar stenosis or aneurysm. Posterior cerebral arteries are normal bilaterally. No intracranial aneurysm within the posterior circulation. Anatomic variants:  None significant. IMPRESSION: 1. Multiple punctate acute/sub infarcts in the bilateral anterior circulation suggesting embolic event. 2. No intracranial large vessel occlusion. Mild-to-moderate stenosis of the cavernous left ICA. 3. Mild chronic microvascular ischemic changes and parenchymal volume loss. Electronically Signed   By: Pedro Earls M.D.   On: 04/06/2021 11:26   US Carotid Bilateral  Result Date: 04/06/2021 CLINICAL DATA:  75 year old male with history of syncope with collapse. EXAM: BILATERAL CAROTID DUPLEX ULTRASOUND TECHNIQUE: Pearline Cables scale imaging, color Doppler and duplex ultrasound were performed of bilateral carotid and vertebral arteries in the  neck. COMPARISON:  None. FINDINGS: Criteria: Quantification of carotid stenosis is based on velocity parameters that correlate the residual internal carotid diameter with NASCET-based stenosis levels, using the diameter of the distal internal carotid lumen as the denominator for stenosis measurement. The following velocity measurements were obtained: RIGHT ICA: Peak systolic velocity 55 cm/sec, End diastolic velocity 15 cm/sec CCA: Peak systolic velocity 91 cm/sec SYSTOLIC ICA/CCA RATIO:  0.6 ECA: Peak systolic velocity 48 cm/sec LEFT ICA: Peak systolic velocity 64 cm/sec, End diastolic velocity 20 cm/sec CCA: 81 cm/sec SYSTOLIC ICA/CCA RATIO:  5.85 ECA: 49 cm/sec RIGHT CAROTID ARTERY: Minimal atherosclerotic plaque formation. No significant tortuosity. Normal low resistance waveforms. RIGHT VERTEBRAL ARTERY:  Antegrade flow. LEFT CAROTID ARTERY: Mild multifocal atherosclerotic plaque formation, most prominent the carotid bulb. No significant tortuosity. Normal low resistance waveforms. LEFT VERTEBRAL ARTERY:  Antegrade flow. Upper extremity non-invasive blood pressures: Not obtained. IMPRESSION: 1. Right carotid artery system: Patent without significant atherosclerotic plaque formation. 2. Left carotid artery system: Less than 50% stenosis secondary to mild multifocal atherosclerotic plaque formation. 3.  Vertebral artery system: Patent with antegrade flow bilaterally. Ruthann Cancer, MD [facility] Radiology Electronically Signed   By: Ruthann Cancer MD   On: 04/06/2021 16:50   DG Chest Port 1 View  Result Date: 04/05/2021 CLINICAL DATA:  Weakness EXAM: PORTABLE CHEST 1 VIEW COMPARISON:  None. FINDINGS: Heart and mediastinal contours are within normal limits. No focal opacities or effusions. No acute bony abnormality. IMPRESSION: No active disease. Electronically Signed   By: Rolm Baptise M.D.   On: 04/05/2021 10:00   EEG adult  Result Date: 04/06/2021 Lora Havens, MD     04/06/2021 12:33 PM Patient Name: Joshua Morales MRN: [id number] Epilepsy Attending: Lora Havens Referring Physician/Provider: Dr Denton Brick, Courage Date: 04/06/2021 Duration: 23.02 mins Patient history: 75yo M with ams. EEG to evaluate for seizure Level of alertness: Awake, asleep AEDs during EEG study: None Technical aspects: This EEG study was done with scalp electrodes positioned according to the 10-20 International system of electrode placement. Electrical activity was acquired at a sampling rate of 500Hz  and reviewed with a high frequency filter of 70Hz  and a low frequency filter of 1Hz . EEG data were recorded continuously and digitally stored. Description: The posterior dominant rhythm consists of 7.5Hz  activity of moderate voltage (25-35 uV) seen predominantly in posterior head regions, symmetric and reactive to eye opening and eye closing. Sleep was characterized by vertex waves, sleep spindles (12 to 14 Hz), maximal frontocentral region.  EEG showed intermittent generalized  3 to 6 Hz theta-delta slowing. Hyperventilation and photic stimulation were not performed.   ABNORMALITY - Intermittent slow, generalized IMPRESSION: This study is suggestive of mild diffuse encephalopathy, nonspecific etiology. No seizures or epileptiform discharges were seen throughout the recording. Lora Havens   ECHOCARDIOGRAM COMPLETE  Result Date: 04/06/2021    ECHOCARDIOGRAM REPORT   Patient  Name:   Joshua Morales Date of Exam: 04/06/2021 Medical Rec #:  [id number]        Height:       70.0 in Accession #:    [id number]       Weight:       125.0 lb Date of Birth:  05-Aug-1946         BSA:          1.709 m Patient Age:    60 years         BP:           115/75 mmHg Patient Gender: M                HR:           94 bpm. Exam Location:  [facility] Procedure: 2D Echo, Cardiac Doppler and Color Doppler Indications:    Stroke l63.9  History:        Patient has no prior history of Echocardiogram examinations.                  Risk Factors:Hypertension, Diabetes and Tobacco abuse. Dementia                 associated with alcoholism ([place]), Agent orange exposure (From                 Hx).  Sonographer:    Alvino Chapel RCS Referring Phys: [phone] COURAGE EMOKPAE IMPRESSIONS  1. Left ventricular ejection fraction, by estimation, is 50 to 55%. The left ventricle has low normal function. The left ventricle has no regional wall motion abnormalities. There is moderate to severe left ventricular hypertrophy. Left ventricular diastolic parameters are consistent with Grade I diastolic dysfunction (impaired relaxation).  2. Right ventricular systolic function is normal. The right ventricular size is normal. Tricuspid regurgitation signal is inadequate for assessing PA pressure.  3. Left atrial size was mildly dilated.  4. There is trivial pericardial effusion anterior to the right ventricle.  5. The mitral valve is myxomatous. Trivial mitral valve regurgitation.  6. The aortic valve is tricuspid. Aortic valve regurgitation is not visualized. Mild aortic valve sclerosis is present, with no evidence of aortic valve stenosis.  7. The inferior vena cava is normal in size with greater than 50% respiratory variability, suggesting right atrial pressure of 3 mmHg. Comparison(s): No prior Echocardiogram. FINDINGS  Left Ventricle: Left ventricular ejection fraction, by estimation, is 50 to 55%. The left ventricle has low normal function. The left ventricle has no regional wall motion abnormalities. The left ventricular internal cavity size was normal in size. There is moderate left ventricular hypertrophy. Left ventricular diastolic parameters are consistent with Grade I diastolic dysfunction (impaired relaxation). Right Ventricle: The right ventricular size is normal. No increase in right ventricular wall thickness. Right ventricular systolic function is normal. Tricuspid regurgitation signal is inadequate for assessing PA pressure. Left Atrium:  Left atrial size was mildly dilated. Right Atrium: Right atrial size was normal in size. Pericardium: Trivial pericardial effusion is present. The pericardial effusion is anterior to the right ventricle. Mitral Valve: The mitral valve is myxomatous. Trivial mitral valve regurgitation. Tricuspid Valve: The tricuspid valve is grossly normal. Tricuspid valve regurgitation is trivial. Aortic Valve: The aortic valve is tricuspid. There is mild aortic valve annular calcification. Aortic valve regurgitation is not visualized. Mild aortic valve sclerosis is present, with no evidence of aortic valve stenosis. Pulmonic Valve: The pulmonic valve was grossly normal. Pulmonic valve regurgitation is trivial. Aorta:  The aortic root is normal in size and structure. Venous: The inferior vena cava is normal in size with greater than 50% respiratory variability, suggesting right atrial pressure of 3 mmHg. IAS/Shunts: No atrial level shunt detected by color flow Doppler.  LEFT VENTRICLE PLAX 2D LVIDd:         4.20 cm  Diastology LVIDs:         3.10 cm  LV e' medial:    5.00 cm/s LV PW:         1.70 cm  LV E/e' medial:  10.5 LV IVS:        1.40 cm  LV e' lateral:   6.42 cm/s LVOT diam:     2.30 cm  LV E/e' lateral: 8.2 LV SV:         51 LV SV Index:   30 LVOT Area:     4.15 cm  RIGHT VENTRICLE RV S prime:     10.00 cm/s TAPSE (M-mode): 1.7 cm LEFT ATRIUM             Index       RIGHT ATRIUM           Index LA diam:        2.70 cm 1.58 cm/m  RA Area:     15.50 cm LA Vol (A2C):   21.8 ml 12.75 ml/m RA Volume:   45.20 ml  26.44 ml/m LA Vol (A4C):   60.2 ml 35.22 ml/m LA Biplane Vol: 36.5 ml 21.35 ml/m  AORTIC VALVE LVOT Vmax:   63.80 cm/s LVOT Vmean:  35.800 cm/s LVOT VTI:    0.122 m  AORTA Ao Root diam: 3.80 cm MITRAL VALVE MV Area (PHT): 3.45 cm    SHUNTS MV Decel Time: 220 msec    Systemic VTI:  0.12 m MV E velocity: 52.60 cm/s  Systemic Diam: 2.30 cm MV A velocity: 64.90 cm/s MV E/A ratio:  0.81 Rozann Lesches MD  Electronically signed by Rozann Lesches MD Signature Date/Time: 04/06/2021/4:16:05 PM    Final    ECHO TEE  Result Date: 04/07/2021    TRANSESOPHOGEAL ECHO REPORT   Patient Name:   Joshua Morales Date of Exam: 04/07/2021 Medical Rec #:  [id number]        Height:       70.0 in Accession #:    [id number]       Weight:       125.0 lb Date of Birth:  1945/09/26         BSA:          1.709 m Patient Age:    89 years         BP:           166/83 mmHg Patient Gender: M                HR:           60 bpm. Exam Location:  [facility] Procedure: Transesophageal Echo, Cardiac Doppler and Color Doppler Indications:    CVA (cerebral vascular accident)  History:        Patient has prior history of Echocardiogram examinations, most                 recent 04/06/2021. Risk Factors:Diabetes and Hypertension. Tobacco                 abuse. Dementia associated with alcoholism ([place]), Agent orange  exposure (From Hx).  Sonographer:    Alvino Chapel RCS Referring Phys: [id number] [place]: TEE procedure time was 11 minutes. The transesophogeal probe was passed without difficulty through the esophogus of the patient. Imaged were obtained with the patient in a left lateral decubitus position. Local oropharyngeal anesthetic was provided with viscous lidocaine. Sedation performed by different physician. The patient was monitored while under deep sedation with propofol per the anesthesia service. Image quality was excellent. The patient developed no complications during the procedure. IMPRESSIONS  1. Left ventricular ejection fraction, by estimation, is 60 to 65%. The left ventricle has normal function. The left ventricle has no regional wall motion abnormalities. There is severe left ventricular hypertrophy.  2. Right ventricular systolic function is normal. The right ventricular size is normal.  3. Left atrial size was mildly dilated. A left atrial/left atrial appendage thrombus was detected,relatively large at  approximately 1 x 2 cm. The LAA emptying velocity was 60 cm/s.  4. There is a trivial pericardial effusion anterior to the right ventricle.  5. 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 Trivial mitral valve regurgitation.  6. The aortic valve is tricuspid. Aortic valve regurgitation is not visualized.  7. 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  8. Agitated saline contrast bubble study was negative, with no evidence of any interatrial shunt. Conclusion(s)/Recommendation(s): Results discussed with Dr. Wynetta Emery. Patient is in sinus rhythm at this time, but the presence of left atrial appendage thrombus suggests significantly increased likelihood of paroxysmal atrial fibrillation. Anticoagulation indicated. FINDINGS  Left Ventricle: Left ventricular ejection fraction, by estimation, is 60 to 65%. The left ventricle has normal function. The left ventricle has no regional wall motion abnormalities. The left ventricular internal cavity size was normal in size. There is  severe left ventricular hypertrophy. Right Ventricle: The right ventricular size is normal. No increase in right ventricular wall thickness. Right ventricular systolic function is normal. Left Atrium: Left atrial size was mildly dilated. A left atrial/left atrial appendage thrombus was detected. The LAA emptying velocity was 60 cm/s. Right Atrium: Right atrial size was normal in size. Pericardium: Trivial pericardial effusion is present. The pericardial effusion is anterior to the right ventricle. Mitral Valve: The mitral valve is myxomatous. Trivial mitral valve regurgitation. Tricuspid Valve: The tricuspid valve is grossly normal. Tricuspid valve regurgitation is trivial. Aortic Valve: The aortic valve is tricuspid. Aortic valve regurgitation is not visualized. Pulmonic Valve: The pulmonic valve was grossly normal. Pulmonic valve regurgitation is trivial. Aorta: The aortic root is  normal in size and structure. There is severe (Grade IV) atheroma plaque involving the descending aorta. IAS/Shunts: No atrial level shunt detected by color flow Doppler. Agitated saline contrast was given intravenously to evaluate for intracardiac shunting. Agitated saline contrast bubble study was negative, with no evidence of any interatrial shunt. Rozann Lesches MD Electronically signed by Rozann Lesches MD Signature Date/Time: 04/07/2021/4:05:51 PM    Final      CBC Recent Labs  Lab 04/05/21 1025 04/06/21 0634  WBC 10.3 8.9  HGB 12.0* 10.7*  HCT 34.5* 32.7*  PLT 221 122*  MCV 96.4 100.0  MCH 33.5 32.7  MCHC 34.8 32.7  RDW 12.2 12.5   Chemistries  Recent Labs  Lab 04/05/21 0938 04/06/21 0634  NA 133* 140  140  K 4.6 3.5  3.5  CL 94* 108  109  CO2 25 24  24   GLUCOSE 584* 143*  144*  BUN 31* 25*  25*  CREATININE 2.19* 1.81*  1.80*  CALCIUM 9.7 9.3  9.3  MG  --  1.8  AST 17  --   ALT 23  --   ALKPHOS 67  --   BILITOT 1.2  --    ------------------------------------------------------------------------------------------------------------------ Recent Labs    04/07/21 0451  CHOL 167  HDL 39*  LDLCALC 115*  TRIG 66  CHOLHDL 4.3    Lab Results  Component Value Date   HGBA1C 12.1 (H) 04/05/2021   ------------------------------------------------------------------------------------------------------------------ Recent Labs    04/06/21 0634  TSH 0.877   ------------------------------------------------------------------------------------------------------------------ Recent Labs    04/06/21 0634  VITAMINB12 302  FOLATE 6.2   Coagulation profile No results for input(s): INR, PROTIME in the last 168 hours.  No results for input(s): DDIMER in the last 72 hours.  Cardiac Enzymes No results for input(s): CKMB, TROPONINI, MYOGLOBIN in the last 168 hours.  Invalid input(s):  CK ------------------------------------------------------------------------------------------------------------------ No results found for: BNP  Irwin Brakeman M.D on 04/07/2021 at 4:21 PM  How to contact the [facility] Attending or Consulting provider [place] or covering provider during after hours [place], for this patient?  Check the care team in [facility] and look for a) attending/consulting TRH provider listed and b) the [facility] team listed Log into [email or web address] and use [place]'s universal password to access. If you do not have the password, please contact the hospital operator. Locate the [facility] provider you are looking for under [facility] and page to a number that you can be directly reached. If you still have difficulty reaching the provider, please page the [facility] (Director on Call) for the Hospitalists listed on amion for assistance.   [facility] - Office  [phone]

## 2021-04-07 NOTE — Evaluation (Signed)
Occupational Therapy Evaluation Patient Details Name: Joshua Morales MRN: [id number] DOB: April 13, 1946 Today's Date: 04/07/2021    History of Present Illness Joshua Morales  is a 75 y.o. male with past medical history relevant for uncontrolled DM, hypertension, alcohol abuse, tobacco abuse, alcohol-related dementia and agent orange exposure--presents by EMS with concerns of altered mentation, right side weakness. MRI confirms bilateral acute infarcts   Clinical Impression   Pt agreeable to OT evaluation this am, family present for evaluation. Pt much improved with strength and mobility compared to chart review from PT session yesterday. Pt with BUE strength 4+/5, coordination intact. Pt able to perform transfers and functional mobility with RW, min guard assist and OT managing purewick and IV lines. Pt performing ADLs with supervision, incorporating BUE without difficulty. No scissoring or LOB during mobility. Recommend HH OT services on discharge to improve safety and independence in ADLs.     Follow Up Recommendations  Home health OT;Supervision/Assistance - 24 hour    Equipment Recommendations  Tub/shower seat       Precautions / Restrictions Precautions Precautions: Fall Restrictions Weight Bearing Restrictions: No      Mobility Bed Mobility Overal bed mobility: Needs Assistance Bed Mobility: Supine to Sit     Supine to sit: Supervision          Transfers Overall transfer level: Needs assistance Equipment used: Rolling walker (2 wheeled) Transfers: Sit to/from [employer] Sit to Stand: Min assist Stand pivot transfers: Min guard       General transfer comment: able to maintain standing balance with RW for >5 minutes, no physical assistance required        ADL either performed or assessed with clinical judgement   ADL Overall ADL's : Needs assistance/impaired     Grooming: Wash/dry hands;Wash/dry face;Min guard;Standing Grooming Details  (indicate cue type and reason): pt standing at sink for grooming tasks, able to use BUE with no standing support without LOB         Upper Body Dressing : Supervision/safety;Sitting   Lower Body Dressing: Supervision/safety;Sitting/lateral leans   Toilet Transfer: Min guard;Ambulation;RW Toilet Transfer Details (indicate cue type and reason): simulated with transfer to chair         Functional mobility during ADLs: Supervision/safety;Min guard;Rolling walker       Vision Baseline Vision/History: No visual deficits Patient Visual Report: No change from baseline Vision Assessment?: Yes Eye Alignment: Within Functional Limits Ocular Range of Motion: Within Functional Limits Alignment/Gaze Preference: Within Defined Limits Tracking/Visual Pursuits: Able to track stimulus in all quads without difficulty Saccades: Within functional limits Convergence: Within functional limits Visual Fields: No apparent deficits            Pertinent Vitals/Pain Pain Assessment: No/denies pain     Hand Dominance Right   Extremity/Trunk Assessment Upper Extremity Assessment Upper Extremity Assessment: Overall WFL for tasks assessed (BUE strength 4+/5)   Lower Extremity Assessment Lower Extremity Assessment: Defer to PT evaluation   Cervical / Trunk Assessment Cervical / Trunk Assessment: Normal   Communication Communication Communication: No difficulties   Cognition Arousal/Alertness: Awake/alert Behavior During Therapy: WFL for tasks assessed/performed Overall Cognitive Status: Within Functional Limits for tasks assessed                                                Home Living Family/patient expects to be discharged to:: Private  residence Living Arrangements: Spouse/significant other;Children (ex-wife) Available Help at Discharge: Family;Available 24 hours/day Type of Home: House Home Access: Stairs to enter [employer] of Steps: 5 Entrance  Stairs-Rails: Right;Left;Can reach both Home Layout: One level     Bathroom Shower/Tub: Chief Strategy Officer: Handicapped height     Home Equipment: None          Prior Functioning/Environment Level of Independence: Independent        Comments: Tourist information centre manager, 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, independent in ADLs        OT Problem List: Decreased strength;Decreased activity tolerance;Impaired balance (sitting and/or standing);Decreased safety awareness;Decreased knowledge of use of DME or AE      OT Treatment/Interventions: Self-care/ADL training;Therapeutic exercise;Neuromuscular education;Therapeutic activities;Patient/family education    OT Goals(Current goals can be found in the care plan section) Acute Rehab OT Goals Patient Stated Goal: return home OT Goal Formulation: With patient Time For Goal Achievement: 04/21/21 Potential to Achieve Goals: Good  OT Frequency: Min 2X/week                 End of Session Equipment Utilized During Treatment: Gait belt;Rolling walker  Activity Tolerance: Patient tolerated treatment well Patient left: in chair;with call bell/phone within reach;with chair alarm set;with family/visitor present  OT Visit Diagnosis: Muscle weakness (generalized) (M62.81);Hemiplegia and hemiparesis Hemiplegia - Right/Left: Right Hemiplegia - dominant/non-dominant: Dominant Hemiplegia - caused by: Cerebral infarction                Time: 0723-0750 OT Time Calculation (min): 27 min Charges:  OT General Charges $OT Visit: 1 Visit OT Evaluation $OT Eval Low Complexity: 1 Low   Ezra Sites, OTR/L  [phone] 04/07/2021, 8:11 AM

## 2021-04-07 NOTE — Progress Notes (Signed)
Inpatient Rehabilitation Admissions Coordinator   Noted therapy recommendations have been updated to Home Health. We will sign off at this time.  Ottie Glazier, RN, MSN Rehab Admissions Coordinator [phone] 04/07/2021 9:07 AM

## 2021-04-07 NOTE — Progress Notes (Signed)
Inpatient Diabetes Program Recommendations  AACE/ADA: New Consensus Statement on Inpatient Glycemic Control (2015)  Target Ranges:  Prepandial:   less than 140 mg/dL      Peak postprandial:   less than 180 mg/dL (1-2 hours)      Critically ill patients:  140 - 180 mg/dL   Results for EYAL, GREENHAW (MRN [id number]) as of 04/07/2021 08:28  Ref. Range 04/06/2021 05:23 04/06/2021 06:52 04/06/2021 07:28 04/06/2021 9:45 04/06/2021 11:06 04/06/2021 17:11 04/06/2021 17:56 04/06/2021 21:07 04/07/2021 07:46  Glucose-Capillary Latest Ref Range: 70 - 99 mg/dL 54 (L) 601 (H) 093 (H)      Semglee 8 units 188 (H)  Novolog 3 units 46 (L) 145 (H) 148 (H) 139 (H)   Review of Glycemic Control  Diabetes history: DM2 Outpatient Diabetes medications: Alogliptin, Metformin, Glipizide Current orders for Inpatient glycemic control: Semglee 6 units daily, Novolog 0-15 units TID with meals, Novolog 0-5 units QHS  Inpatient Diabetes Program Recommendations:    Insulin: Patient received Semglee 15 units on 04/05/21 and fasting glucose 54 mg/dl on 10/08/53 so Semglee decreased to 8 units daily on 04/06/21.  Noted glucose down to 46 mg/dl on 03/07/21 at 02:54 which was likely due to Novolog correction. Would recommend increasing Semglee back up to 8 units daily and decreasing Novolog correction to sensitive scale 0-9 units TID with meals.  Thanks, [name], RN, MSN, CDE Diabetes Coordinator Inpatient Diabetes Program [phone] (Team Pager from 8am to 5pm)

## 2021-04-07 NOTE — Plan of Care (Signed)
  Problem: Acute Rehab OT Goals (only OT should resolve) Goal: Pt. Will Perform Eating Flowsheets (Taken 04/07/2021 0814) Pt Will Perform Eating:  with modified independence  sitting Goal: Pt. Will Perform Grooming Flowsheets (Taken 04/07/2021 0814) Pt Will Perform Grooming:  with supervision  standing Goal: Pt. Will Perform Upper Body Dressing Flowsheets (Taken 04/07/2021 0814) Pt Will Perform Upper Body Dressing:  with supervision  sitting Goal: Pt. Will Perform Lower Body Dressing Flowsheets (Taken 04/07/2021 0814) Pt Will Perform Lower Body Dressing:  with supervision  sitting/lateral leans  sit to/from stand Goal: Pt. Will Transfer To Toilet Flowsheets (Taken 04/07/2021 [phone]) Pt Will Transfer to Toilet:  with supervision  ambulating  regular height toilet Goal: Pt. Will Perform Toileting-Clothing Manipulation Flowsheets (Taken 04/07/2021 0814) Pt Will Perform Toileting - Clothing Manipulation and hygiene:  with supervision  sitting/lateral leans  sit to/from stand Goal: Pt/Caregiver Will Perform Home Exercise Program Flowsheets (Taken 04/07/2021 [phone]) Pt/caregiver will Perform Home Exercise Program:  Increased strength  Both right and left upper extremity  With Supervision  With written HEP provided

## 2021-04-08 DIAGNOSIS — N179 Acute kidney failure, unspecified: Secondary | ICD-10-CM | POA: Diagnosis not present

## 2021-04-08 DIAGNOSIS — G9341 Metabolic encephalopathy: Secondary | ICD-10-CM | POA: Diagnosis not present

## 2021-04-08 DIAGNOSIS — F1027 Alcohol dependence with alcohol-induced persisting dementia: Secondary | ICD-10-CM

## 2021-04-08 DIAGNOSIS — F1011 Alcohol abuse, in remission: Secondary | ICD-10-CM | POA: Diagnosis not present

## 2021-04-08 DIAGNOSIS — I639 Cerebral infarction, unspecified: Secondary | ICD-10-CM | POA: Diagnosis not present

## 2021-04-08 DIAGNOSIS — E1165 Type 2 diabetes mellitus with hyperglycemia: Secondary | ICD-10-CM

## 2021-04-08 LAB — CBC
HCT: 28.6 % — ABNORMAL LOW (ref 39.0–52.0)
Hemoglobin: 9.1 g/dL — ABNORMAL LOW (ref 13.0–17.0)
MCH: 32.4 pg (ref 26.0–34.0)
MCHC: 31.8 g/dL (ref 30.0–36.0)
MCV: 101.8 fL — ABNORMAL HIGH (ref 80.0–100.0)
Platelets: 147 10*3/uL — ABNORMAL LOW (ref 150–400)
RBC: 2.81 MIL/uL — ABNORMAL LOW (ref 4.22–5.81)
RDW: 12.5 % (ref 11.5–15.5)
WBC: 7.5 10*3/uL (ref 4.0–10.5)
nRBC: 0 % (ref 0.0–0.2)

## 2021-04-08 LAB — GLUCOSE, CAPILLARY
Glucose-Capillary: 166 mg/dL — ABNORMAL HIGH (ref 70–99)
Glucose-Capillary: 201 mg/dL — ABNORMAL HIGH (ref 70–99)
Glucose-Capillary: 284 mg/dL — ABNORMAL HIGH (ref 70–99)

## 2021-04-08 LAB — BASIC METABOLIC PANEL
Anion gap: 5 (ref 5–15)
BUN: 26 mg/dL — ABNORMAL HIGH (ref 8–23)
CO2: 23 mmol/L (ref 22–32)
Calcium: 8.5 mg/dL — ABNORMAL LOW (ref 8.9–10.3)
Chloride: 106 mmol/L (ref 98–111)
Creatinine, Ser: 1.84 mg/dL — ABNORMAL HIGH (ref 0.61–1.24)
GFR, Estimated: 38 mL/min — ABNORMAL LOW (ref >60)
Glucose, Bld: 202 mg/dL — ABNORMAL HIGH (ref 70–99)
Potassium: 4.1 mmol/L (ref 3.5–5.1)
Sodium: 134 mmol/L — ABNORMAL LOW (ref 135–145)

## 2021-04-08 LAB — MAGNESIUM: Magnesium: 1.8 mg/dL (ref 1.7–2.4)

## 2021-04-08 MED ORDER — DONEPEZIL HCL 5 MG PO TABS: 5.0000 mg | ORAL_TABLET | Freq: Every day | ORAL | 1 refills | Status: AC

## 2021-04-08 MED ORDER — THIAMINE HCL 100 MG PO TABS: 100.0000 mg | ORAL_TABLET | Freq: Every day | ORAL | 1 refills | Status: AC

## 2021-04-08 MED ORDER — ADULT MULTIVITAMIN W/MINERALS CH: 1.0000 | ORAL_TABLET | Freq: Every day | ORAL | Status: AC

## 2021-04-08 MED ORDER — INSULIN ASPART 100 UNIT/ML FLEXPEN: 2.0000 [IU] | PEN_INJECTOR | Freq: Three times a day (TID) | SUBCUTANEOUS | 0 refills | Status: DC

## 2021-04-08 MED ORDER — INSULIN GLARGINE 100 UNITS/ML SOLOSTAR PEN: 8.0000 [IU] | PEN_INJECTOR | Freq: Every day | SUBCUTANEOUS | 0 refills | Status: DC

## 2021-04-08 MED ORDER — MEMANTINE HCL 5 MG PO TABS: 5.0000 mg | ORAL_TABLET | Freq: Two times a day (BID) | ORAL | 1 refills | Status: AC

## 2021-04-08 MED ORDER — QUETIAPINE FUMARATE 25 MG PO TABS: 25.0000 mg | ORAL_TABLET | Freq: Every day | ORAL | 0 refills | Status: AC

## 2021-04-08 MED ORDER — INSULIN PEN NEEDLE 31G X 5 MM MISC: 1.0000 | 2 refills | Status: AC

## 2021-04-08 MED ORDER — POLYETHYLENE GLYCOL 3350 17 G PO PACK: 17.0000 g | PACK | Freq: Every day | ORAL | 0 refills | Status: DC | PRN

## 2021-04-08 MED ORDER — APIXABAN 2.5 MG PO TABS: 2.5000 mg | ORAL_TABLET | Freq: Two times a day (BID) | ORAL | 0 refills | Status: DC

## 2021-04-08 MED ORDER — FOLIC ACID 1 MG PO TABS: 1.0000 mg | ORAL_TABLET | Freq: Every day | ORAL | 1 refills | Status: AC

## 2021-04-08 MED ORDER — ATENOLOL 25 MG PO TABS: 25.0000 mg | ORAL_TABLET | Freq: Every day | ORAL | 1 refills | Status: AC

## 2021-04-08 NOTE — Progress Notes (Signed)
Pt discharged to POV via WC with cane, shower chair and other personal belongings in his and his wife's possession.

## 2021-04-08 NOTE — Evaluation (Signed)
Clinical/Bedside Swallow Evaluation Patient Details  Name: Joshua Morales MRN: [id number] Date of Birth: 11/02/1945  Today's Date: 04/08/2021 Time: SLP Start Time (ACUTE ONLY): 0845 SLP Stop Time (ACUTE ONLY): 0905 SLP Time Calculation (min) (ACUTE ONLY): 20 min  Past Medical History:  Past Medical History:  Diagnosis Date   Agent orange exposure    Dementia (HCC)    Diabetes mellitus without complication (HCC)    Hypertension    Past Surgical History:  Past Surgical History:  Procedure Laterality Date   LUNG REMOVAL, PARTIAL     TOE AMPUTATION     HPI:  Joshua Morales  is a 75 y.o. 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 Head CT negative.MRI shows Multiple punctate acute/sub infarcts in the bilateral anterior  circulation suggesting embolic event. BSE requested.   Assessment / Plan / Recommendation Clinical Impression  Clinical swallow evaluation completed at bedside. Oral motor examination is unremarkable. Pt is edentulous, but consumes regular textures at home. No gross facial asymmetry. Pt denies difficulty swallowing. His daughter had indicated yesterday that he occasionally coughed with meals, she is not present today. Pt consumed self presented ice chips, water via cup/straw, applesauce, and crackers with only one delayed cough after sequential cup sips of water initially. He also had just finished 100% of his breakfast tray. Vocal quality remained clear throughout. Recommend regular textures and thin liquids, medications whole with water with standard aspiration and reflux precautions. Pt reports that he smokes cigarettes and has had a cough prior to admission. No further SLP services indicated at this time. SLP Visit Diagnosis: Dysphagia, unspecified (R13.10)    Aspiration Risk  No limitations    Diet  Recommendation Regular;Thin liquid   Liquid Administration via: Cup;Straw Medication Administration: Whole meds with liquid Supervision: Patient able to self feed Compensations: Slow rate Postural Changes: Seated upright at 90 degrees;Remain upright for at least 30 minutes after po intake    Other  Recommendations Oral Care Recommendations: Oral care BID Other Recommendations: Clarify dietary restrictions   Follow up Recommendations None      Frequency and Duration            Prognosis Prognosis for Safe Diet Advancement: Good      Swallow Study   General Date of Onset: 04/05/21 HPI: Joshua Morales  is a 75 y.o. 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 Head CT negative.MRI shows Multiple punctate acute/sub infarcts in the bilateral anterior  circulation suggesting embolic event. BSE requested. Type of Study: Bedside Swallow Evaluation Previous Swallow Assessment: N/A Diet Prior to this Study: Regular;Thin liquids Temperature Spikes Noted: No Respiratory Status: Room air History of Recent Intubation: No Behavior/Cognition: Alert;Cooperative;Pleasant mood Oral Cavity Assessment: Within Functional Limits Oral Care Completed by SLP: No Oral Cavity - Dentition: Edentulous Vision: Functional for self-feeding Self-Feeding Abilities: Able to feed self Patient Positioning: Upright in bed Baseline Vocal Quality: Normal Volitional Cough: Strong;Congested Volitional Swallow: Able to elicit    Oral/Motor/Sensory Function Overall Oral Motor/Sensory Function: Within functional limits   Ice Chips Ice chips: Within functional limits Presentation: Spoon   Thin Liquid Thin Liquid: Impaired Presentation: Cup;Self Fed;Straw Pharyngeal  Phase Impairments: Cough - Delayed (1x delayed cough over course of 240 mL)    Nectar  Thick Nectar Thick Liquid: Not  tested   Honey Thick Honey Thick Liquid: Not tested   Puree Puree: Within functional limits Presentation: Self Fed;Spoon   Solid     Solid: Within functional limits Presentation: Self Fed     Thank you,  Havery Moros, CCC-SLP [phone]  Joshua Morales 04/08/2021,9:14 AM

## 2021-04-08 NOTE — TOC Transition Note (Signed)
Transition of Care [facility]) - CM/SW Discharge Note   Patient Details  Name: Joshua Morales MRN: [id number] Date of Birth: 1945-10-24  [facility] CM/SW Contact:  Shade Flood, LCSW Phone Number: 04/08/2021, 11:45 AM   Clinical Narrative:     Pt stable for dc today per MD. Met with pt and spoke with pt's daughter by phone to review dc planning. Pt and daughter agreeable to [facility] and DME as recommended. Discussed CMS provider options and referred as requested.   DME has been delivered to pt's room. HH will follow pt at home. There are no other TOC needs for dc.  Final next level of care: [place] Barriers to Discharge: Barriers Resolved   Patient Goals and CMS Choice Patient states their goals for this hospitalization and ongoing recovery are:: go home CMS Medicare.gov Compare Post Acute Care list provided to:: Patient Choice offered to / list presented to : Patient  Discharge Placement                       Discharge Plan and Services                DME Arranged: Tub bench, Kasandra Knudsen DME Agency: [employer] Date DME Agency Contacted: 04/08/21   Representative spoke with at DME Agency: Caryl Pina HH Arranged: RN, PT, OT [facility] Agency: Sour Osher ([place]) Date [place]: 04/08/21   Representative spoke with at [place]: [place] ([place]) Interventions     Readmission Risk Interventions No flowsheet data found.

## 2021-04-08 NOTE — Discharge Summary (Signed)
Physician Discharge Summary  Joshua Morales ZOX:[id number] DOB: November 01, 1945 DOA: 04/05/2021  PCP: [facility] Neurologist: Kirtland Bouchard. [place]  Admit date: 04/05/2021 Discharge date: 04/08/2021  Admitted From:   Home  Disposition:  Home with [facility]   Recommendations for Outpatient Follow-up:  Follow up with PCP in 1 weeks Follow up with neurologist in 4 weeks for stroke follow up.  Please obtain BMP/CBC in 1-2 weeks  Home Health: RN, PT   Discharge Condition: STABLE   CODE STATUS: FULL  DIET: heart healthy carb modified    Brief Hospitalization Summary: Please see all hospital notes, images, labs for full details of the hospitalization. ADMISSION HPI:  75 y.o. male with past medical history relevant for uncontrolled DM, hypertension, alcohol abuse, tobacco abuse, alcohol-related dementia and agent orange exposure--presents by EMS with concerns of altered mentation -Patient last known normal around 7 PM -The family usually talkative this morning not really talking much -In the ED no fevers   No Nausea, Vomiting or Diarrhea -Family denies recent fall or head injury -CT head no acute findings -CBC with white count of 10.3 hemoglobin is 12.0, platelets 221 -Chemistry with glucose of 584 with a bicarb of 25, anion gap is 14 -creatinine is 2.19 baseline not available chloride is 94 and sodium is 134  -Additional history obtained from patient's daughter [facility] COURSE BY PROBLEM LIST   Brief Summary:- 75 y.o. male with past medical history relevant for uncontrolled DM, hypertension, alcohol abuse, tobacco abuse, alcohol-related dementia and agent orange exposure admitted on 04/05/2021 with generalized weakness and altered mentation and found to have multiple acute and subacute bilateral strokes consistent with embolic type phenomena   A/P  1)Acute CVA/Multiple punctate acute/sub infarcts in the bilateral anterior circulation suggesting embolic event--- EKG today is NSR ----  Place on telemetry monitored unit, --treat empirically with aspirin and  Lipitor  Rx,  MRI Brain-- confirms embolic strokes as above ---neurologist Dr. Gerilyn Pilgrim recommended TEE ---TEE completed on 8/3 with findings of atrial thrombus: started full anticoagulation with apixaban  echocardiogram with EF of 50 to 55 %, with LVH and Grade 1 Diastolic Dysfunction -carotid artery Dopplers -without hemodynamically significant stenosis -MRA head without LVO -Official neurology consult pending.   -PT/OT eval pending, speech eval pending, We will allow some permissive hypertension in view of possible acute stroke, avoid precipitous drop in blood pressure. TSH is 0.87 -A1c 12.1 Fasting lipid profile--pending -Aspirin and Plavix discontinued for now--and patient has been started on Eliquis given concerns for embolic stroke and atrial thrombus -TEE noted above.  -No need to do a 30-day event monitor given that it would not change the current recommendation of full anticoagulation.  # Maximize blood glucose control with insulin coverage (goal FS 60-180) #Please maintain euthermia. Tylenol prn for temp >100.4 #Telemetry monitoring -EEG without epileptiform findings -UDS negative B12 and folate WNL -Outpatient follow up with neurologist in 4 weeks recommended.    2)AKI VS Possible CKD--- --creatinine on admission 2.19 baseline not available  -Creatinine down to 1.8 with hydration avoid Nephrotoxic agents / dehydration  / hypotension   3)Acute Versus chronic anemia --  hemoglobin is 12.0 >> 10.7,  platelets 221 >> 122 OUTPATIENT FOLLOW UP WITH PCP   4)history of alcoholic dementia--- supportive care, Aricept, Namenda and Seroquel as ordered--B12 and folate WNL   5) tobacco abuse--nicotine patch as ordered in hospital, counseled on smoking cessation.     6)DM2-uncontrolled DM with hyperglycemia--- on admission patient did not meet criteria for  DKA -A1c is 12.1 reflecting uncontrolled DM with  hyperglycemia PTA -HE WILL DISCHARGE ON INSULIN PENS.  LANTUS SOLOSTAR AND NOVOLOG FLEXPEN WITH NEEDLES AND INSTRUCTIONS: LANTUS 8 UNITS DAILY.  NOVOLOG 2 UNITS TIDWC IF EATS 50% OR MORE OF MEAL.  RECOMMENDED TESTING BLOOD SUGAR 4 TIMES PER DAY AND AS NEEDED WHEN NOT FEELING WELL.     7) history of EtOH abuse--- longstanding history of heavy alcohol use -Quit drinking in March 2022 after DUI -c/n thiamine folic acid on multivitamin -DTs unlikely given lack of EtOH use recently   Disposition/Need for in-Hospital Stay- plan to discharge tomorrow if remains stable    Status is: Inpatient   Disposition: The patient is from: Home              Anticipated d/c is to:  Home with [facility]              Anticipated d/c date is: 04/08/2021              Patient currently is medically stable to d/c.  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   apixaban 2.5 MG Tabs tablet Commonly known as: ELIQUIS Take 1 tablet (2.5 mg total) by mouth 2 (two) times daily.   atenolol 25 MG tablet Commonly known as: TENORMIN Take 1 tablet (25 mg total) by mouth daily. What changed:   medication strength how much to take   atorvastatin 40 MG tablet Commonly known as: LIPITOR Take 40 mg by mouth daily.   D3-1000 25 MCG (1000 UT) tablet Generic drug: Cholecalciferol Take 2,000 Units by mouth daily.   donepezil 5 MG tablet Commonly known as: ARICEPT Take 1 tablet (5 mg total) by mouth at bedtime.   folic acid 1 MG tablet Commonly known as: FOLVITE Take 1 tablet (1 mg total) by mouth daily.   hydrOXYzine 10 MG tablet Commonly known as: ATARAX/VISTARIL Take 10 mg by mouth 2 (two) times daily as needed for itching.   insulin aspart 100 UNIT/ML FlexPen Commonly known as: NOVOLOG Inject 2 Units into the skin 3 (three) times daily with meals. IF EATS 50% OR MORE OF MEAL.   insulin glargine 100 unit/mL Sopn Commonly known as: LANTUS Inject 8 Units into the skin daily.   Insulin Pen Needle 31G X 5 MM Misc 1 Device by Does not apply route as directed.   memantine 5 MG tablet Commonly known as: NAMENDA Take 1 tablet (5 mg total) by mouth 2 (two) times daily.   multivitamin with minerals Tabs tablet Take 1 tablet by mouth daily.   polyethylene glycol 17 g packet Commonly known as:  MIRALAX / GLYCOLAX Take 17 g by mouth daily as needed for mild constipation.   QUEtiapine 25 MG tablet Commonly known as: SEROQUEL Take 1 tablet (25 mg total) by mouth at bedtime.   thiamine 100 MG tablet Take 1 tablet (100 mg total) by mouth daily.        Follow-up Information     [facility]. Schedule an appointment as soon as possible for a visit in 1 week(s).   Specialty: General Practice Why: Hospital Follow Up Contact information: [address] [place] [address] [phone]         Beryle Beamsoonquah, Kofi, MD. Schedule an appointment as soon as possible for a visit in 1 month(s).   Specialty: Neurology Why: Hospital Follow Up Contact information: [address] [place] [address] [phone]                No Known Allergies Allergies as of  04/08/2021   No Known Allergies      Medication List     STOP taking these medications    metFORMIN 500 MG 24 hr tablet Commonly known as: GLUCOPHAGE-XR   metoprolol tartrate 25 MG tablet Commonly known as: LOPRESSOR   mirtazapine 15 MG tablet Commonly known as: REMERON       TAKE these medications    ACIDOPHILUS LACTOBACILLUS PO Take 2 capsules by mouth in the morning and at bedtime.   apixaban 2.5 MG Tabs tablet Commonly known as: ELIQUIS Take 1 tablet (2.5 mg total) by mouth 2 (two) times daily.   atenolol 25 MG tablet Commonly known as: TENORMIN Take 1 tablet (25 mg total) by mouth daily. What changed:  medication strength how much to take   atorvastatin 40 MG tablet Commonly known as: LIPITOR Take 40 mg by mouth daily.   D3-1000 25 MCG (1000 UT) tablet Generic drug: Cholecalciferol Take 2,000 Units by mouth daily.   donepezil 5 MG tablet Commonly known as: ARICEPT Take 1 tablet (5 mg total) by mouth at bedtime.   folic acid 1 MG tablet Commonly known as: FOLVITE Take 1 tablet (1 mg total) by mouth daily.   hydrOXYzine 10 MG tablet Commonly known as: ATARAX/VISTARIL Take 10 mg by mouth 2 (two) times daily as needed for itching.   insulin aspart 100 UNIT/ML FlexPen Commonly known as: NOVOLOG Inject 2 Units into the skin 3 (three) times daily with meals. IF EATS 50% OR MORE OF MEAL.   insulin glargine 100 unit/mL Sopn Commonly known as: LANTUS Inject 8 Units into the skin daily.   Insulin Pen Needle 31G X 5 MM Misc 1 Device by Does not apply route as directed.   memantine 5 MG tablet Commonly known as: NAMENDA Take 1 tablet (5 mg total) by mouth 2 (two) times daily.   multivitamin with minerals Tabs tablet Take 1 tablet by mouth daily.   polyethylene glycol 17 g packet Commonly known as: MIRALAX / GLYCOLAX Take 17 g by mouth daily as needed for mild constipation.   QUEtiapine 25 MG tablet Commonly known as: SEROQUEL Take 1 tablet (25  mg total) by mouth at bedtime.   thiamine 100 MG tablet Take 1 tablet (100 mg total) by mouth daily.        Procedures/Studies: CT HEAD WO CONTRAST  Result Date: 04/05/2021 CLINICAL DATA:  Mental status change, unknown cause. Awoke today with difficulty speaking. Dementia EXAM: CT HEAD WITHOUT CONTRAST TECHNIQUE: Contiguous axial images were obtained from the base of the skull through the vertex without  intravenous contrast. COMPARISON:  None. FINDINGS: Brain: Generalized atrophy. There may be some frontal and temporal predominance. Chronic small-vessel ischemic change of the hemispheric white matter. No sign of acute infarction, mass lesion, hemorrhage, hydrocephalus or extra-axial collection. Vascular: There is atherosclerotic calcification of the major vessels at the base of the brain. Skull: Negative Sinuses/Orbits: Clear/normal Other: None IMPRESSION: No acute brain finding. Brain atrophy, possibly with some frontal and temporal predominance. Chronic small-vessel change of the hemispheric white matter. Electronically Signed   By: Paulina Fusi M.D.   On: 04/05/2021 11:03   MR ANGIO HEAD WO CONTRAST  Result Date: 04/06/2021 CLINICAL DATA:  Mental status change, unknown cause. EXAM: MRI HEAD WITHOUT CONTRAST MRA HEAD WITHOUT CONTRAST TECHNIQUE: Multiplanar, multi-echo pulse sequences of the brain and surrounding structures were acquired without intravenous contrast. Angiographic images of the Circle of Willis were acquired using MRA technique without intravenous contrast. COMPARISON:  Head CT April 05, 2021 FINDINGS: MRI HEAD FINDINGS Brain: Scattered small foci of restricted diffusion within the bilateral frontal parietal regions and the splenium of the corpus callosum, consistent with acute/subacute infarcts. Punctate focus of susceptibility artifact in the left occipital lobe suggesting small hemosiderin deposit. Scattered foci of T2 hyperintensity are seen within the white matter of the cerebral  hemispheres, nonspecific, most likely related to small vessel ischemia. Mild parenchymal volume loss. Vascular: Normal flow voids. Skull and upper cervical spine: Normal marrow signal. Sinuses/Orbits: Left lens surgery. Paranasal sinuses are essentially clear. MRA HEAD FINDINGS Anterior circulation: Luminal irregularity of the bilateral intracranial internal carotid arteries with mild-to-moderate stenosis at the distal left cavernous segment. The bilateral anterior cerebral arteries and middle cerebral arteries are widely patent with antegrade flow without high-grade flow-limiting stenosis or proximal branch occlusion. No intracranial aneurysm within the anterior circulation. Posterior circulation: The vertebral arteries are widely patent with antegrade flow. The posterior inferior cerebral arteries are normal. Vertebrobasilar junction and basilar artery are widely patent with antegrade flow without evidence of basilar stenosis or aneurysm. Posterior cerebral arteries are normal bilaterally. No intracranial aneurysm within the posterior circulation. Anatomic variants: None significant. IMPRESSION: 1. Multiple punctate acute/sub infarcts in the bilateral anterior circulation suggesting embolic event. 2. No intracranial large vessel occlusion. Mild-to-moderate stenosis of the cavernous left ICA. 3. Mild chronic microvascular ischemic changes and parenchymal volume loss. Electronically Signed   By: Baldemar Lenis M.D.   On: 04/06/2021 11:26   MR BRAIN WO CONTRAST  Result Date: 04/06/2021 CLINICAL DATA:  Mental status change, unknown cause. EXAM: MRI HEAD WITHOUT CONTRAST MRA HEAD WITHOUT CONTRAST TECHNIQUE: Multiplanar, multi-echo pulse sequences of the brain and surrounding structures were acquired without intravenous contrast. Angiographic images of the Circle of Willis were acquired using MRA technique without intravenous contrast. COMPARISON:  Head CT April 05, 2021 FINDINGS: MRI HEAD FINDINGS  Brain: Scattered small foci of restricted diffusion within the bilateral frontal parietal regions and the splenium of the corpus callosum, consistent with acute/subacute infarcts. Punctate focus of susceptibility artifact in the left occipital lobe suggesting small hemosiderin deposit. Scattered foci of T2 hyperintensity are seen within the white matter of the cerebral hemispheres, nonspecific, most likely related to small vessel ischemia. Mild parenchymal volume loss. Vascular: Normal flow voids. Skull and upper cervical spine: Normal marrow signal. Sinuses/Orbits: Left lens surgery. Paranasal sinuses are essentially clear. MRA HEAD FINDINGS Anterior circulation: Luminal irregularity of the bilateral intracranial internal carotid arteries with mild-to-moderate stenosis at the distal left cavernous segment. The bilateral anterior cerebral arteries and middle cerebral arteries are  widely patent with antegrade flow without high-grade flow-limiting stenosis or proximal branch occlusion. No intracranial aneurysm within the anterior circulation. Posterior circulation: The vertebral arteries are widely patent with antegrade flow. The posterior inferior cerebral arteries are normal. Vertebrobasilar junction and basilar artery are widely patent with antegrade flow without evidence of basilar stenosis or aneurysm. Posterior cerebral arteries are normal bilaterally. No intracranial aneurysm within the posterior circulation. Anatomic variants: None significant. IMPRESSION: 1. Multiple punctate acute/sub infarcts in the bilateral anterior circulation suggesting embolic event. 2. No intracranial large vessel occlusion. Mild-to-moderate stenosis of the cavernous left ICA. 3. Mild chronic microvascular ischemic changes and parenchymal volume loss. Electronically Signed   By: Baldemar Lenis M.D.   On: 04/06/2021 11:26   US Carotid Bilateral  Result Date: 04/06/2021 CLINICAL DATA:  75 year old male with history of  syncope with collapse. EXAM: BILATERAL CAROTID DUPLEX ULTRASOUND TECHNIQUE: Wallace Cullens scale imaging, color Doppler and duplex ultrasound were performed of bilateral carotid and vertebral arteries in the neck. COMPARISON:  None. FINDINGS: Criteria: Quantification of carotid stenosis is based on velocity parameters that correlate the residual internal carotid diameter with NASCET-based stenosis levels, using the diameter of the distal internal carotid lumen as the denominator for stenosis measurement. The following velocity measurements were obtained: RIGHT ICA: Peak systolic velocity 55 cm/sec, End diastolic velocity 15 cm/sec CCA: Peak systolic velocity 91 cm/sec SYSTOLIC ICA/CCA RATIO:  0.6 ECA: Peak systolic velocity 48 cm/sec LEFT ICA: Peak systolic velocity 64 cm/sec, End diastolic velocity 20 cm/sec CCA: 81 cm/sec SYSTOLIC ICA/CCA RATIO:  0.79 ECA: 49 cm/sec RIGHT CAROTID ARTERY: Minimal atherosclerotic plaque formation. No significant tortuosity. Normal low resistance waveforms. RIGHT VERTEBRAL ARTERY:  Antegrade flow. LEFT CAROTID ARTERY: Mild multifocal atherosclerotic plaque formation, most prominent the carotid bulb. No significant tortuosity. Normal low resistance waveforms. LEFT VERTEBRAL ARTERY:  Antegrade flow. Upper extremity non-invasive blood pressures: Not obtained. IMPRESSION: 1. Right carotid artery system: Patent without significant atherosclerotic plaque formation. 2. Left carotid artery system: Less than 50% stenosis secondary to mild multifocal atherosclerotic plaque formation. 3.  Vertebral artery system: Patent with antegrade flow bilaterally. Marliss Coots, MD [facility] Radiology Electronically Signed   By: Marliss Coots MD   On: 04/06/2021 16:50   DG Chest Port 1 View  Result Date: 04/05/2021 CLINICAL DATA:  Weakness EXAM: PORTABLE CHEST 1 VIEW COMPARISON:  None. FINDINGS: Heart and mediastinal contours are within normal limits. No focal  opacities or effusions. No acute bony abnormality. IMPRESSION: No active disease. Electronically Signed   By: Charlett Nose M.D.   On: 04/05/2021 10:00   EEG adult  Result Date: 04/06/2021 Charlsie Quest, MD     04/06/2021 12:33 PM Patient Name: Joshua Morales MRN: [id number] Epilepsy Attending: Charlsie Quest Referring Physician/Provider: Dr Mariea Clonts, [place] Date: 04/06/2021 Duration: 23.02 mins Patient history: 75yo M with ams. EEG to evaluate for seizure Level of alertness: Awake, asleep AEDs during EEG study: None Technical aspects: This EEG study was done with scalp electrodes positioned according to the 10-20 International system of electrode placement. Electrical activity was acquired at a sampling rate of 500Hz  and reviewed with a high frequency filter of 70Hz  and a low frequency filter of 1Hz . EEG data were recorded continuously and digitally stored. Description: The posterior dominant rhythm consists of 7.5Hz  activity of moderate voltage (25-35 uV) seen predominantly in posterior head regions, symmetric and reactive to eye opening and eye closing. Sleep was characterized by vertex waves, sleep spindles (12 to  14 Hz), maximal frontocentral region.  EEG showed intermittent generalized  3 to 6 Hz theta-delta slowing. Hyperventilation and photic stimulation were not performed.   ABNORMALITY - Intermittent slow, generalized IMPRESSION: This study is suggestive of mild diffuse encephalopathy, nonspecific etiology. No seizures or epileptiform discharges were seen throughout the recording. Charlsie Quest   ECHOCARDIOGRAM COMPLETE  Result Date: 04/06/2021    ECHOCARDIOGRAM REPORT   Patient Name:   PELLEGRINO KENNARD Date of Exam: 04/06/2021 Medical Rec #:  [id number]        Height:       70.0 in Accession #:    [id number]       Weight:       125.0 lb Date of Birth:  [birth year]         BSA:          1.709 m Patient Age:    75 years         BP:           115/75 mmHg Patient Gender: M                HR:            94 bpm. [facility] Procedure: 2D Echo, Cardiac Doppler and Color Doppler Indications:    Stroke l63.9  History:        Patient has no prior history of Echocardiogram examinations.                 Risk Factors:Hypertension, Diabetes and Tobacco abuse. Dementia                 associated with alcoholism (HCC), Agent orange exposure (From                 Hx).  Sonographer:    Celesta Gentile RCS Referring Phys: [phone] COURAGE EMOKPAE IMPRESSIONS  1. Left ventricular ejection fraction, by estimation, is 50 to 55%. The left ventricle has low normal function. The left ventricle has no regional wall motion abnormalities. There is moderate to severe left ventricular hypertrophy. Left ventricular diastolic parameters are consistent with Grade I diastolic dysfunction (impaired relaxation).  2. Right ventricular systolic function is normal. The right ventricular size is normal. Tricuspid regurgitation signal is inadequate for assessing PA pressure.  3. Left atrial size was mildly dilated.  4. There is trivial pericardial effusion anterior to the right ventricle.  5. The mitral valve is myxomatous. Trivial mitral valve regurgitation.  6. The aortic valve is tricuspid. Aortic valve regurgitation is not visualized. Mild aortic valve sclerosis is present, with no evidence of aortic valve stenosis.  7. The inferior vena cava is normal in size with greater than 50% respiratory variability, suggesting right atrial pressure of 3 mmHg. Comparison(s): No prior Echocardiogram. FINDINGS  Left Ventricle: Left ventricular ejection fraction, by estimation, is 50 to 55%. The left ventricle has low normal function. The left ventricle has no regional wall motion abnormalities. The left ventricular internal cavity size was normal in size. There is moderate left ventricular hypertrophy. Left ventricular diastolic parameters are consistent with Grade I diastolic dysfunction (impaired relaxation). Right Ventricle: The right  ventricular size is normal. No increase in right ventricular wall thickness. Right ventricular systolic function is normal. Tricuspid regurgitation signal is inadequate for assessing PA pressure. Left Atrium: Left atrial size was mildly dilated. Right Atrium: Right atrial size was normal in size. Pericardium: Trivial pericardial effusion is present. The pericardial effusion is anterior to the right ventricle.  Mitral Valve: The mitral valve is myxomatous. Trivial mitral valve regurgitation. Tricuspid Valve: The tricuspid valve is grossly normal. Tricuspid valve regurgitation is trivial. Aortic Valve: The aortic valve is tricuspid. There is mild aortic valve annular calcification. Aortic valve regurgitation is not visualized. Mild aortic valve sclerosis is present, with no evidence of aortic valve stenosis. Pulmonic Valve: The pulmonic valve was grossly normal. Pulmonic valve regurgitation is trivial. Aorta: The aortic root is normal in size and structure. Venous: The inferior vena cava is normal in size with greater than 50% respiratory variability, suggesting right atrial pressure of 3 mmHg. IAS/Shunts: No atrial level shunt detected by color flow Doppler.  LEFT VENTRICLE PLAX 2D LVIDd:         4.20 cm  Diastology LVIDs:         3.10 cm  LV e' medial:    5.00 cm/s LV PW:         1.70 cm  LV E/e' medial:  10.5 LV IVS:        1.40 cm  LV e' lateral:   6.42 cm/s LVOT diam:     2.30 cm  LV E/e' lateral: 8.2 LV SV:         51 LV SV Index:   30 LVOT Area:     4.15 cm  RIGHT VENTRICLE RV S prime:     10.00 cm/s TAPSE (M-mode): 1.7 cm LEFT ATRIUM             Index       RIGHT ATRIUM           Index LA diam:        2.70 cm 1.58 cm/m  RA Area:     15.50 cm LA Vol (A2C):   21.8 ml 12.75 ml/m RA Volume:   45.20 ml  26.44 ml/m LA Vol (A4C):   60.2 ml 35.22 ml/m LA Biplane Vol: 36.5 ml 21.35 ml/m  AORTIC VALVE LVOT Vmax:   63.80 cm/s LVOT Vmean:  35.800 cm/s LVOT VTI:    0.122 m  AORTA Ao Root diam: 3.80 cm MITRAL VALVE MV  Area (PHT): 3.45 cm    SHUNTS MV Decel Time: 220 msec    Systemic VTI:  0.12 m MV E velocity: 52.60 cm/s  Systemic Diam: 2.30 cm MV A velocity: 64.90 cm/s MV E/A ratio:  0.81 [name] MD Electronically signed by [name] MD Signature Date/Time: 04/06/2021/4:16:05 PM    Final    ECHO TEE  Result Date: 04/07/2021    TRANSESOPHOGEAL ECHO REPORT   Patient Name:   Joshua Morales Date of Exam: 04/07/2021 Medical Rec #:  [id number]        Height:       70.0 in Accession #:    [id number]       Weight:       125.0 lb Date of Birth:  [birth year]         BSA:          1.709 m Patient Age:    75 years         BP:           166/83 mmHg Patient Gender: M                HR:           60 bpm. [facility] Procedure: Transesophageal Echo, Cardiac Doppler and Color Doppler Indications:    CVA (cerebral vascular accident)  History:  Patient has prior history of Echocardiogram examinations, most                 recent 04/06/2021. Risk Factors:Diabetes and Hypertension. 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  Sonographer:    Celesta Gentile RCS Referring Phys: [id number] Ellsworth Lennox PROCEDURE: TEE procedure time was 11 minutes. The transesophogeal probe was passed without difficulty through the esophogus of the patient. Imaged were obtained with the patient in a left lateral decubitus position. Local oropharyngeal anesthetic was provided with viscous lidocaine. Sedation performed by different physician. The patient was monitored while under deep sedation with propofol per the anesthesia service. Image quality was excellent. The patient developed no complications during the procedure. IMPRESSIONS  1. Left ventricular ejection fraction, by estimation, is 60 to 65%. The left ventricle has normal function. The left ventricle has no regional wall motion abnormalities. There is severe left ventricular hypertrophy.  2. Right  ventricular systolic function is normal. The right ventricular size is normal.  3. Left atrial size was mildly dilated. A left atrial/left atrial appendage thrombus was detected,relatively large at approximately 1 x 2 cm. The LAA emptying velocity was 60 cm/s.  4. There is a trivial pericardial effusion anterior to the right ventricle.  5. 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 Trivial mitral valve regurgitation.  6. The aortic valve is tricuspid. Aortic valve regurgitation is not visualized.  7. 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  8. Agitated saline contrast bubble study was negative, with no evidence of any interatrial shunt. Conclusion(s)/Recommendation(s): Results discussed with Dr. Laural Benes. Patient is in sinus rhythm at this time, but the presence of left atrial appendage thrombus suggests significantly increased likelihood of paroxysmal atrial fibrillation. Anticoagulation indicated. FINDINGS  Left Ventricle: Left ventricular ejection fraction, by estimation, is 60 to 65%. The left ventricle has normal function. The left ventricle has no regional wall motion abnormalities. The left ventricular internal cavity size was normal in size. There is  severe left ventricular hypertrophy. Right Ventricle: The right ventricular size is normal. No increase in right ventricular wall thickness. Right ventricular systolic function is normal. Left Atrium: Left atrial size was mildly dilated. A left atrial/left atrial appendage thrombus was detected. The LAA emptying velocity was 60 cm/s. Right Atrium: Right atrial size was normal in size. Pericardium: Trivial pericardial effusion is present. The pericardial effusion is anterior to the right ventricle. Mitral Valve: The mitral valve is myxomatous. Trivial mitral valve regurgitation. Tricuspid Valve: The tricuspid valve is grossly normal. Tricuspid valve regurgitation is trivial. Aortic Valve:  The aortic valve is tricuspid. Aortic valve regurgitation is not visualized. Pulmonic Valve: The pulmonic valve was grossly normal. Pulmonic valve regurgitation is trivial. Aorta: The aortic root is normal in size and structure. There is severe (Grade IV) atheroma plaque involving the descending aorta. IAS/Shunts: No atrial level shunt detected by color flow Doppler. Agitated saline contrast was given intravenously to evaluate for intracardiac shunting. Agitated saline contrast bubble study was negative, with no evidence of any interatrial shunt. [name] MD Electronically signed by [name] MD Signature Date/Time: 04/07/2021/4:05:51 PM    Final      Subjective: Pt is awake alert, no distress, eating and drinking well. Agreeable to  home health services.    Discharge Exam: Vitals:   04/07/21 2202 04/08/21 0445  BP: (!) 147/85 (!) 152/80  Pulse: 61 (!) 58  Resp:  18  Temp:  98.8 F (37.1 C)  SpO2:  100%   Vitals:   04/07/21 1549 04/07/21 2142 04/07/21 2202 04/08/21 0445  BP: (!) 163/85 (!) 172/65 (!) 147/85 (!) 152/80  Pulse: (!) 53 (!) 57 61 (!) 58  Resp: Temp: 97.6 F (36.4 C) 98.1 F (36.7 C)  98.8 F (37.1 C)  TempSrc: Oral Oral    SpO2: 100% 100%  100%  Weight:      Height:       General: Pt is alert, awake, not in acute distress Cardiovascular: normal S1/S2 +, no rubs, no gallops Respiratory: CTA bilaterally, no wheezing, no rhonchi Abdominal: Soft, NT, ND, bowel sounds + Extremities: no edema, no cyanosis   The results of significant diagnostics from this hospitalization (including imaging, microbiology, ancillary and laboratory) are listed below for reference.     Microbiology: Recent Results (from the past 240 hour(s))  Resp Panel by RT-PCR (Flu A&B, Covid) Nasopharyngeal Swab     Status: None   Collection Time: 04/05/21 10:29 AM   Specimen: Nasopharyngeal Swab; Nasopharyngeal(NP) swabs in vial transport medium  Result Value Ref Range Status    SARS Coronavirus 2 by RT PCR NEGATIVE NEGATIVE Final    Comment: (NOTE) SARS-CoV-2 target nucleic acids are NOT DETECTED.  The SARS-CoV-2 RNA is generally detectable in upper respiratory specimens during the acute phase of infection. The lowest concentration of SARS-CoV-2 viral copies this assay can detect is 138 copies/mL. A negative result does not preclude SARS-Cov-2 infection and should not be used as the sole basis for treatment or other patient management decisions. A negative result may occur with  improper specimen collection/handling, submission of specimen other than nasopharyngeal swab, presence of viral mutation(s) within the areas targeted by this assay, and inadequate number of viral copies(<138 copies/mL). A negative result must be combined with clinical observations, patient history, and epidemiological information. The expected result is Negative.  Fact Sheet for Patients:  BloggerCourse.com  Fact Sheet for Healthcare Providers:  SeriousBroker.it  This test is no t yet approved or cleared by the [place] FDA and  has been authorized for detection and/or diagnosis of SARS-CoV-2 by FDA under an Emergency Use Authorization (EUA). This EUA will remain  in effect (meaning this test can be used) for the duration of the COVID-19 declaration under Section 564(b)(1) of the Act, 21 U.S.C.section 360bbb-3(b)(1), unless the authorization is terminated  or revoked sooner.       Influenza A by PCR NEGATIVE NEGATIVE Final   Influenza B by PCR NEGATIVE NEGATIVE Final    Comment: (NOTE) The Xpert Xpress SARS-CoV-2/FLU/RSV plus assay is intended as an aid in the diagnosis of influenza from Nasopharyngeal swab specimens and should not be used as a sole basis for treatment. Nasal washings and aspirates are unacceptable for Xpert Xpress SARS-CoV-2/FLU/RSV testing.  Fact Sheet for  Patients: BloggerCourse.com  Fact Sheet for Healthcare Providers: SeriousBroker.it  This test is not yet approved or cleared by the [place] FDA and has been authorized for detection and/or diagnosis of SARS-CoV-2 by FDA under an Emergency Use Authorization (EUA). This EUA will remain in effect (meaning this test can be used) for the duration of the COVID-19 declaration under Section 564(b)(1) of the Act, 21 U.S.C. section 360bbb-3(b)(1), unless the authorization is terminated or revoked.  Performed at [facility], [address], [place], [place] [address]      Labs: BNP (last 3 results) No results for input(s): BNP in the last 8760 hours. Basic Metabolic Panel: Recent Labs  Lab 04/05/21 0938 04/06/21 0634 04/08/21 0620  NA 133* 140  140 134*  K 4.6 3.5  3.5 4.1  CL 94* 108  109 106  CO2 GLUCOSE 584* 143*  144* 202*  BUN 31* 25*  25* 26*  CREATININE 2.19* 1.81*  1.80* 1.84*  CALCIUM 9.7 9.3  9.3 8.5*  MG  --  1.8 1.8  PHOS  --  3.3  --    Liver Function Tests: Recent Labs  Lab 04/05/21 0938 04/06/21 0634  AST 17  --   ALT 23  --   ALKPHOS 67  --   BILITOT 1.2  --   PROT 7.9  --   ALBUMIN 4.4 3.8   No results for input(s): LIPASE, AMYLASE in the last 168 hours. No results for input(s): AMMONIA in the last 168 hours. CBC: Recent Labs  Lab 04/05/21 0938 04/06/21 0634 04/08/21 0620  WBC 10.3 8.9 7.5  HGB 12.0* 10.7* 9.1*  HCT 34.5* 32.7* 28.6*  MCV 96.4 100.0 101.8*  PLT 221 122* 147*   Cardiac Enzymes: No results for input(s): CKTOTAL, CKMB, CKMBINDEX, TROPONINI in the last 168 hours. BNP: Invalid input(s): POCBNP CBG: Recent Labs  Lab 04/07/21 1418 04/07/21 1616 04/07/21 2139 04/08/21 0349 04/08/21 0718  GLUCAP 226* 195* 310* 166* 201*   D-Dimer No results for input(s): DDIMER in the last 72 hours. Hgb A1c No results for input(s): HGBA1C in the last 72  hours. Lipid Profile Recent Labs    04/07/21 0451  CHOL 167  HDL 39*  LDLCALC 115*  TRIG 66  CHOLHDL 4.3   Thyroid function studies Recent Labs    04/06/21 0634  TSH 0.877   Anemia work up Recent Labs    04/06/21 0634  VITAMINB12 302  FOLATE 6.2   Urinalysis    Component Value Date/Time   COLORURINE YELLOW 04/05/2021 1559   APPEARANCEUR CLOUDY (A) 04/05/2021 1559   LABSPEC 1.013 04/05/2021 1559   PHURINE 5.0 04/05/2021 1559   GLUCOSEU >=500 (A) 04/05/2021 1559   HGBUR MODERATE (A) 04/05/2021 1559   BILIRUBINUR NEGATIVE 04/05/2021 1559   KETONESUR NEGATIVE 04/05/2021 1559   PROTEINUR >=300 (A) 04/05/2021 1559   NITRITE NEGATIVE 04/05/2021 1559   LEUKOCYTESUR NEGATIVE 04/05/2021 1559   Sepsis Labs Invalid input(s): PROCALCITONIN,  WBC,  LACTICIDVEN Microbiology Recent Results (from the past 240 hour(s))  Resp Panel by RT-PCR (Flu A&B, Covid) Nasopharyngeal Swab     Status: None   Collection Time: 04/05/21 10:29 AM   Specimen: Nasopharyngeal Swab; Nasopharyngeal(NP) swabs in vial transport medium  Result Value Ref Range Status   SARS Coronavirus 2 by RT PCR NEGATIVE NEGATIVE Final    Comment: (NOTE) SARS-CoV-2 target nucleic acids are NOT DETECTED.  The SARS-CoV-2 RNA is generally detectable in upper respiratory specimens during the acute phase of infection. The lowest concentration of SARS-CoV-2 viral copies this assay can detect is 138 copies/mL. A negative result does not preclude SARS-Cov-2 infection and should not be used as the sole basis for treatment or other patient management decisions. A negative result may occur with  improper specimen collection/handling, submission of specimen other than nasopharyngeal swab, presence of viral mutation(s) within the areas targeted by this assay, and inadequate number of viral copies(<138 copies/mL). A  negative result must be combined with clinical observations, patient history, and epidemiological information.  The expected result is Negative.  Fact Sheet for Patients:  BloggerCourse.com  Fact Sheet for Healthcare Providers:  SeriousBroker.it  This test is no t yet approved or cleared by the [place] FDA and  has been authorized for detection and/or diagnosis of SARS-CoV-2 by FDA under an Emergency Use Authorization (EUA). This EUA will remain  in effect (meaning this test can be used) for the duration of the COVID-19 declaration under Section 564(b)(1) of the Act, 21 U.S.C.section 360bbb-3(b)(1), unless the authorization is terminated  or revoked sooner.       Influenza A by PCR NEGATIVE NEGATIVE Final   Influenza B by PCR NEGATIVE NEGATIVE Final    Comment: (NOTE) The Xpert Xpress SARS-CoV-2/FLU/RSV plus assay is intended as an aid in the diagnosis of influenza from Nasopharyngeal swab specimens and should not be used as a sole basis for treatment. Nasal washings and aspirates are unacceptable for Xpert Xpress SARS-CoV-2/FLU/RSV testing.  Fact Sheet for Patients: BloggerCourse.com  Fact Sheet for Healthcare Providers: SeriousBroker.it  This test is not yet approved or cleared by the [place] FDA and has been authorized for detection and/or diagnosis of SARS-CoV-2 by FDA under an Emergency Use Authorization (EUA). This EUA will remain in effect (meaning this test can be used) for the duration of the COVID-19 declaration under Section 564(b)(1) of the Act, 21 U.S.C. section 360bbb-3(b)(1), unless the authorization is terminated or revoked.  Performed at [facility], [address], [place], [place] [address]     Time coordinating discharge: 35 minutes   SIGNED:  Standley Dakins, MD  [facility] 04/08/2021, 10:01 AM How to contact the [facility] Attending or Consulting provider 7A - 7P or covering provider during after hours 7P -7A, for this patient?  Check the care  team in [facility] and look for a) attending/consulting [facility] provider listed and b) the [facility] team listed Log into [email or web address] and use [place]'s universal password to access. If you do not have the password, please contact the hospital operator. Locate the [facility] provider you are looking for under [facility] and page to a number that you can be directly reached. If you still have difficulty reaching the provider, please page the [facility] (Director on Call) for the Hospitalists listed on amion for assistance.

## 2021-04-09 ENCOUNTER — Encounter (HOSPITAL_COMMUNITY): Payer: Self-pay | Admitting: Cardiology

## 2021-05-05 ENCOUNTER — Encounter (HOSPITAL_COMMUNITY): Payer: Self-pay | Admitting: Emergency Medicine

## 2021-05-05 ENCOUNTER — Other Ambulatory Visit: Payer: Self-pay

## 2021-05-05 ENCOUNTER — Inpatient Hospital Stay (HOSPITAL_COMMUNITY)
Admission: EM | Admit: 2021-05-05 | Discharge: 2021-05-09 | DRG: 372 | Disposition: A | Discharge status: Home / Self Care | Payer: No Typology Code available for payment source | Attending: Internal Medicine | Admitting: Internal Medicine

## 2021-05-05 DIAGNOSIS — Z681 Body mass index (BMI) 19 or less, adult: Secondary | ICD-10-CM

## 2021-05-05 DIAGNOSIS — E440 Moderate protein-calorie malnutrition: Secondary | ICD-10-CM | POA: Diagnosis present

## 2021-05-05 DIAGNOSIS — Z7901 Long term (current) use of anticoagulants: Secondary | ICD-10-CM

## 2021-05-05 DIAGNOSIS — E860 Dehydration: Secondary | ICD-10-CM | POA: Diagnosis present

## 2021-05-05 DIAGNOSIS — A0472 Enterocolitis due to Clostridium difficile, not specified as recurrent: Secondary | ICD-10-CM | POA: Diagnosis present

## 2021-05-05 DIAGNOSIS — R112 Nausea with vomiting, unspecified: Secondary | ICD-10-CM

## 2021-05-05 DIAGNOSIS — F0390 Unspecified dementia without behavioral disturbance: Secondary | ICD-10-CM | POA: Diagnosis present

## 2021-05-05 DIAGNOSIS — Z720 Tobacco use: Secondary | ICD-10-CM | POA: Diagnosis present

## 2021-05-05 DIAGNOSIS — I513 Intracardiac thrombosis, not elsewhere classified: Secondary | ICD-10-CM | POA: Diagnosis present

## 2021-05-05 DIAGNOSIS — Z808 Family history of malignant neoplasm of other organs or systems: Secondary | ICD-10-CM

## 2021-05-05 DIAGNOSIS — A041 Enterotoxigenic Escherichia coli infection: Secondary | ICD-10-CM | POA: Diagnosis not present

## 2021-05-05 DIAGNOSIS — N183 Chronic kidney disease, stage 3 unspecified: Secondary | ICD-10-CM | POA: Diagnosis present

## 2021-05-05 DIAGNOSIS — E1122 Type 2 diabetes mellitus with diabetic chronic kidney disease: Secondary | ICD-10-CM | POA: Diagnosis present

## 2021-05-05 DIAGNOSIS — N1832 Chronic kidney disease, stage 3b: Secondary | ICD-10-CM | POA: Diagnosis present

## 2021-05-05 DIAGNOSIS — I10 Essential (primary) hypertension: Secondary | ICD-10-CM | POA: Diagnosis present

## 2021-05-05 DIAGNOSIS — D539 Nutritional anemia, unspecified: Secondary | ICD-10-CM | POA: Diagnosis present

## 2021-05-05 DIAGNOSIS — Z833 Family history of diabetes mellitus: Secondary | ICD-10-CM

## 2021-05-05 DIAGNOSIS — IMO0002 Reserved for concepts with insufficient information to code with codable children: Secondary | ICD-10-CM | POA: Diagnosis present

## 2021-05-05 DIAGNOSIS — E785 Hyperlipidemia, unspecified: Secondary | ICD-10-CM | POA: Diagnosis present

## 2021-05-05 DIAGNOSIS — I129 Hypertensive chronic kidney disease with stage 1 through stage 4 chronic kidney disease, or unspecified chronic kidney disease: Secondary | ICD-10-CM | POA: Diagnosis present

## 2021-05-05 DIAGNOSIS — Z8249 Family history of ischemic heart disease and other diseases of the circulatory system: Secondary | ICD-10-CM

## 2021-05-05 DIAGNOSIS — Z79899 Other long term (current) drug therapy: Secondary | ICD-10-CM

## 2021-05-05 DIAGNOSIS — F17210 Nicotine dependence, cigarettes, uncomplicated: Secondary | ICD-10-CM | POA: Diagnosis present

## 2021-05-05 DIAGNOSIS — N179 Acute kidney failure, unspecified: Secondary | ICD-10-CM | POA: Diagnosis present

## 2021-05-05 DIAGNOSIS — F1027 Alcohol dependence with alcohol-induced persisting dementia: Secondary | ICD-10-CM | POA: Diagnosis present

## 2021-05-05 DIAGNOSIS — Z20822 Contact with and (suspected) exposure to covid-19: Secondary | ICD-10-CM | POA: Diagnosis present

## 2021-05-05 DIAGNOSIS — Z77098 Contact with and (suspected) exposure to other hazardous, chiefly nonmedicinal, chemicals: Secondary | ICD-10-CM | POA: Diagnosis present

## 2021-05-05 DIAGNOSIS — Z794 Long term (current) use of insulin: Secondary | ICD-10-CM

## 2021-05-05 DIAGNOSIS — E1165 Type 2 diabetes mellitus with hyperglycemia: Secondary | ICD-10-CM | POA: Diagnosis present

## 2021-05-05 HISTORY — DX: Chronic kidney disease, stage 3 unspecified: N18.30

## 2021-05-05 HISTORY — DX: Nutritional anemia, unspecified: D53.9

## 2021-05-05 HISTORY — DX: Intracardiac thrombosis, not elsewhere classified: I51.3

## 2021-05-05 LAB — RESP PANEL BY RT-PCR (FLU A&B, COVID) ARPGX2
Influenza A by PCR: NEGATIVE
Influenza B by PCR: NEGATIVE
SARS Coronavirus 2 by RT PCR: NEGATIVE

## 2021-05-05 LAB — URINALYSIS, ROUTINE W REFLEX MICROSCOPIC
Bilirubin Urine: NEGATIVE
Glucose, UA: >=500 mg/dL — AB
Ketones, ur: NEGATIVE mg/dL
Leukocytes,Ua: NEGATIVE
Nitrite: NEGATIVE
Protein, ur: 100 mg/dL — AB
Specific Gravity, Urine: 1.015 (ref 1.005–1.030)
pH: 6 (ref 5.0–8.0)

## 2021-05-05 LAB — COMPREHENSIVE METABOLIC PANEL
ALT: 41 U/L (ref 0–44)
AST: 32 U/L (ref 15–41)
Albumin: 4.1 g/dL (ref 3.5–5.0)
Alkaline Phosphatase: 63 U/L (ref 38–126)
Anion gap: 11 (ref 5–15)
BUN: 33 mg/dL — ABNORMAL HIGH (ref 8–23)
CO2: 25 mmol/L (ref 22–32)
Calcium: 9.1 mg/dL (ref 8.9–10.3)
Chloride: 98 mmol/L (ref 98–111)
Creatinine, Ser: 2.26 mg/dL — ABNORMAL HIGH (ref 0.61–1.24)
GFR, Estimated: 30 mL/min — ABNORMAL LOW (ref >60)
Glucose, Bld: 439 mg/dL — ABNORMAL HIGH (ref 70–99)
Potassium: 4.8 mmol/L (ref 3.5–5.1)
Sodium: 134 mmol/L — ABNORMAL LOW (ref 135–145)
Total Bilirubin: 0.7 mg/dL (ref 0.3–1.2)
Total Protein: 7.7 g/dL (ref 6.5–8.1)

## 2021-05-05 LAB — LIPASE, BLOOD: Lipase: 56 U/L — ABNORMAL HIGH (ref 11–51)

## 2021-05-05 LAB — CBC
HCT: 32 % — ABNORMAL LOW (ref 39.0–52.0)
Hemoglobin: 10.2 g/dL — ABNORMAL LOW (ref 13.0–17.0)
MCH: 33.4 pg (ref 26.0–34.0)
MCHC: 31.9 g/dL (ref 30.0–36.0)
MCV: 104.9 fL — ABNORMAL HIGH (ref 80.0–100.0)
Platelets: 233 10*3/uL (ref 150–400)
RBC: 3.05 MIL/uL — ABNORMAL LOW (ref 4.22–5.81)
RDW: 14.3 % (ref 11.5–15.5)
WBC: 9.4 10*3/uL (ref 4.0–10.5)
nRBC: 0 % (ref 0.0–0.2)

## 2021-05-05 LAB — CBG MONITORING, ED: Glucose-Capillary: 327 mg/dL — ABNORMAL HIGH (ref 70–99)

## 2021-05-05 MED ORDER — ACETAMINOPHEN 325 MG PO TABS: 650.0000 mg | ORAL_TABLET | Freq: Four times a day (QID) | ORAL | Status: DC | PRN

## 2021-05-05 MED ORDER — ONDANSETRON HCL 4 MG/2ML IJ SOLN: 4.0000 mg | Freq: Four times a day (QID) | INTRAMUSCULAR | Status: DC | PRN

## 2021-05-05 MED ORDER — ONDANSETRON HCL 4 MG/2ML IJ SOLN
4.0000 mg | Freq: Once | INTRAMUSCULAR | Status: AC
  Administered 2021-05-05: 4 mg via INTRAVENOUS
  Filled 2021-05-05: qty 2

## 2021-05-05 MED ORDER — ONDANSETRON HCL 4 MG/2ML IJ SOLN: 4.0000 mg | Freq: Once | INTRAMUSCULAR | Status: DC

## 2021-05-05 MED ORDER — SODIUM CHLORIDE 0.9 % IV BOLUS
1000.0000 mL | Freq: Once | INTRAVENOUS | Status: AC
  Administered 2021-05-05: 1000 mL via INTRAVENOUS

## 2021-05-05 MED ORDER — ONDANSETRON 4 MG PO TBDP
4.0000 mg | ORAL_TABLET | Freq: Once | ORAL | Status: AC | PRN
  Administered 2021-05-05: 4 mg via ORAL
  Filled 2021-05-05: qty 1

## 2021-05-05 MED ORDER — ONDANSETRON HCL 4 MG PO TABS: 4.0000 mg | ORAL_TABLET | Freq: Four times a day (QID) | ORAL | Status: DC | PRN

## 2021-05-05 MED ORDER — LACTATED RINGERS IV BOLUS
1000.0000 mL | Freq: Once | INTRAVENOUS | Status: AC
  Administered 2021-05-05: 1000 mL via INTRAVENOUS

## 2021-05-05 MED ORDER — ACETAMINOPHEN 650 MG RE SUPP: 650.0000 mg | Freq: Four times a day (QID) | RECTAL | Status: DC | PRN

## 2021-05-05 MED ORDER — LACTATED RINGERS IV SOLN: INTRAVENOUS | Status: DC

## 2021-05-05 NOTE — ED Triage Notes (Signed)
Vomiting and diarrhea that started yesterday.  Pt is weak now and is having trouble standing up.

## 2021-05-05 NOTE — ED Notes (Signed)
Attempted to ambulate patient. Patient weak and unsteady gait. Patient returned to bed and became nauseated.

## 2021-05-05 NOTE — ED Notes (Signed)
PO challenge completed. Drank x 2 cans of diet sprite et ate crackers.

## 2021-05-05 NOTE — ED Provider Notes (Signed)
[facility] EMERGENCY DEPARTMENT Provider Note   CSN: [id number] Arrival date & time: 05/05/21  1238     History No chief complaint on file.   Joshua Morales is a 75 y.o. male.  HPI Patient presents with nausea and vomiting.  Began yesterday.  Now little weak.  States difficulty getting up.  No chest pain.  No real abdominal pain.  Brought in by caregiver.  Has had strokes.  No blood in the emesis.  No blood in the diarrhea.Patient is on Eliquis.  Reportedly had clot in his heart and it is planned.    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  Social History   Tobacco Use   Smoking status: Every Day    Types: Cigarettes  Substance Use Topics   Alcohol use: Yes    Comment: occ   Drug use: Never    Home Medications Prior to Admission medications   Medication Sig Start Date End  Date Taking? Authorizing Provider  ACIDOPHILUS LACTOBACILLUS PO Take 2 capsules by mouth in the morning and at bedtime.   Yes [provider]  apixaban (ELIQUIS) 2.5 MG TABS tablet Take 1 tablet (2.5 mg total) by mouth 2 (two) times daily. 04/08/21  Yes Johnson, Clanford L, MD  atenolol (TENORMIN) 25 MG tablet Take 1 tablet (25 mg total) by mouth daily. 04/08/21  Yes Johnson, Clanford L, MD  atorvastatin (LIPITOR) 40 MG tablet Take 40 mg by mouth daily.   Yes [provider]  Cholecalciferol (D3-1000) 25 MCG (1000 UT) tablet Take 2,000 Units by mouth daily.   Yes [provider]  donepezil (ARICEPT) 5 MG tablet Take 1 tablet (5 mg total) by mouth at bedtime. 04/08/21  Yes Johnson, Clanford L, MD  folic acid (FOLVITE) 1 MG tablet Take 1 tablet (1 mg total) by mouth daily. 04/08/21  Yes Johnson, Clanford L, MD  hydrOXYzine (ATARAX/VISTARIL) 10 MG tablet Take 10 mg by mouth 2 (two) times daily as needed for itching.   Yes [provider]  insulin aspart (NOVOLOG) 100 UNIT/ML FlexPen Inject 2 Units into the skin 3 (three) times daily with meals. IF EATS 50% OR MORE OF MEAL. Patient taking differently: Inject 4 Units into the skin 3 (three) times daily with meals. IF EATS 50% OR MORE OF MEAL. 04/08/21  Yes Johnson, Clanford L, MD  insulin detemir (  LEVEMIR) 100 UNIT/ML FlexPen Inject 8 Units into the skin daily.   Yes [provider]  memantine (NAMENDA) 5 MG tablet Take 1 tablet (5 mg total) by mouth 2 (two) times daily. 04/08/21  Yes Johnson, Clanford L, MD  mirtazapine (REMERON) 15 MG tablet TAKE ONE TABLET BY MOUTH AT BEDTIME TO STIMULATE APPETITE 08/07/20  Yes [provider]  Multiple Vitamin (MULTIVITAMIN WITH MINERALS) TABS tablet Take 1 tablet by mouth daily. 04/08/21  Yes Johnson, Clanford L, MD  QUEtiapine (SEROQUEL) 25 MG tablet Take 1 tablet (25 mg total) by mouth at bedtime. 04/08/21  Yes Johnson, Clanford L, MD  thiamine 100 MG tablet Take 1 tablet (100  mg total) by mouth daily. 04/08/21  Yes Johnson, Clanford L, MD  insulin glargine (LANTUS) 100 unit/mL SOPN Inject 8 Units into the skin daily. Patient not taking: Reported on 05/05/2021 04/08/21   Cleora Fleet, MD  Insulin Pen Needle 31G X 5 MM MISC 1 Device by Does not apply route as directed. 04/08/21   Johnson, Clanford L, MD  polyethylene glycol (MIRALAX / GLYCOLAX) 17 g packet Take 17 g by mouth daily as needed for mild constipation. Patient not taking: No sig reported 04/08/21   Cleora Fleet, MD    Allergies    Patient has no known allergies.  Review of Systems   Review of Systems  Constitutional:  Positive for appetite change and fatigue.  HENT:  Negative for congestion.   Respiratory:  Negative for shortness of breath.   Cardiovascular:  Negative for chest pain.  Gastrointestinal:  Positive for diarrhea and nausea.  Genitourinary:  Negative for flank pain.  Musculoskeletal:  Negative for back pain.  Skin:  Negative for rash.  Neurological:  Positive for weakness.  Psychiatric/Behavioral:  Negative for behavioral problems.    Physical Exam Updated Vital Signs BP (!) 177/68   Pulse 81   Temp 98 F (36.7 C) (Temporal)   Resp 16   Ht 5\' 10"  (1.778 m)   Wt 56 kg   SpO2 98%   BMI 17.71 kg/m   Physical Exam Vitals and nursing note reviewed.  HENT:     Head: Atraumatic.  Eyes:     General: No scleral icterus. Cardiovascular:     Rate and Rhythm: Regular rhythm.  Pulmonary:     Breath sounds: No wheezing or rhonchi.  Abdominal:     Tenderness: There is no abdominal tenderness.  Musculoskeletal:        General: No tenderness.     Cervical back: Neck supple.  Skin:    General: Skin is warm.  Neurological:     Mental Status: He is alert. Mental status is at baseline.    ED Results / Procedures / Treatments   Labs (all labs ordered are listed, but only abnormal results are displayed) Labs Reviewed  LIPASE, BLOOD - Abnormal; Notable for the following  components:      Result Value   Lipase 56 (*)    All other components within normal limits  COMPREHENSIVE METABOLIC PANEL - Abnormal; Notable for the following components:   Sodium 134 (*)    Glucose, Bld 439 (*)    BUN 33 (*)    Creatinine, Ser 2.26 (*)    GFR, Estimated 30 (*)    All other components within normal limits  CBC - Abnormal; Notable for the following components:   RBC 3.05 (*)    Hemoglobin 10.2 (*)    HCT 32.0 (*)  MCV 104.9 (*)    All other components within normal limits  URINALYSIS, ROUTINE W REFLEX MICROSCOPIC - Abnormal; Notable for the following components:   Glucose, UA >=500 (*)    Hgb urine dipstick SMALL (*)    Protein, ur 100 (*)    Bacteria, UA RARE (*)    All other components within normal limits  CBG MONITORING, ED - Abnormal; Notable for the following components:   Glucose-Capillary 327 (*)    All other components within normal limits  RESP PANEL BY RT-PCR (FLU A&B, COVID) ARPGX2    EKG None  Radiology No results found.  Procedures Procedures   Medications Ordered in ED Medications  ondansetron (ZOFRAN) injection 4 mg (has no administration in time range)  ondansetron (ZOFRAN-ODT) disintegrating tablet 4 mg (4 mg Oral Given 05/05/21 1407)  lactated ringers bolus 1,000 mL (0 mLs Intravenous Stopped 05/05/21 1905)  sodium chloride 0.9 % bolus 1,000 mL (0 mLs Intravenous Stopped 05/05/21 2049)    ED Course  I have reviewed the triage vital signs and the nursing notes.  Pertinent labs & imaging results that were available during my care of the patient were reviewed by me and considered in my medical decision making (see chart for details).    MDM Rules/Calculators/A&P                           Patient presents with nausea vomiting diarrhea.  Began yesterday.  Continues to have nauseousness.  Initially had been doing well in the ER but with attempted ambulation became nauseous and vomited.  Has had a recent stroke.  Does not feel  vertiginous.  No blood in the emesis or diarrhea.  Creatinine increased now to 2.3.  Recently was 1.8.  Since cannot tolerate orals will admit to hospitalist Final Clinical Impression(s) / ED Diagnoses Final diagnoses:  Nausea vomiting and diarrhea  Dehydration    Rx / DC Orders ED Discharge Orders     None        Benjiman Core, MD 05/05/21 2155

## 2021-05-05 NOTE — ED Notes (Signed)
Patient eating hamburger at this time.

## 2021-05-06 ENCOUNTER — Encounter (HOSPITAL_COMMUNITY): Payer: Self-pay | Admitting: Internal Medicine

## 2021-05-06 DIAGNOSIS — F1027 Alcohol dependence with alcohol-induced persisting dementia: Secondary | ICD-10-CM

## 2021-05-06 DIAGNOSIS — I129 Hypertensive chronic kidney disease with stage 1 through stage 4 chronic kidney disease, or unspecified chronic kidney disease: Secondary | ICD-10-CM | POA: Diagnosis present

## 2021-05-06 DIAGNOSIS — E1122 Type 2 diabetes mellitus with diabetic chronic kidney disease: Secondary | ICD-10-CM | POA: Diagnosis present

## 2021-05-06 DIAGNOSIS — Z20822 Contact with and (suspected) exposure to covid-19: Secondary | ICD-10-CM | POA: Diagnosis present

## 2021-05-06 DIAGNOSIS — I513 Intracardiac thrombosis, not elsewhere classified: Secondary | ICD-10-CM | POA: Diagnosis present

## 2021-05-06 DIAGNOSIS — Z8249 Family history of ischemic heart disease and other diseases of the circulatory system: Secondary | ICD-10-CM | POA: Diagnosis not present

## 2021-05-06 DIAGNOSIS — N1832 Chronic kidney disease, stage 3b: Secondary | ICD-10-CM | POA: Diagnosis present

## 2021-05-06 DIAGNOSIS — E860 Dehydration: Secondary | ICD-10-CM

## 2021-05-06 DIAGNOSIS — Z79899 Other long term (current) drug therapy: Secondary | ICD-10-CM | POA: Diagnosis not present

## 2021-05-06 DIAGNOSIS — N179 Acute kidney failure, unspecified: Secondary | ICD-10-CM

## 2021-05-06 DIAGNOSIS — Z833 Family history of diabetes mellitus: Secondary | ICD-10-CM | POA: Diagnosis not present

## 2021-05-06 DIAGNOSIS — A0472 Enterocolitis due to Clostridium difficile, not specified as recurrent: Secondary | ICD-10-CM | POA: Diagnosis present

## 2021-05-06 DIAGNOSIS — Z794 Long term (current) use of insulin: Secondary | ICD-10-CM | POA: Diagnosis not present

## 2021-05-06 DIAGNOSIS — Z808 Family history of malignant neoplasm of other organs or systems: Secondary | ICD-10-CM | POA: Diagnosis not present

## 2021-05-06 DIAGNOSIS — Z77098 Contact with and (suspected) exposure to other hazardous, chiefly nonmedicinal, chemicals: Secondary | ICD-10-CM | POA: Diagnosis present

## 2021-05-06 DIAGNOSIS — D539 Nutritional anemia, unspecified: Secondary | ICD-10-CM | POA: Diagnosis present

## 2021-05-06 DIAGNOSIS — E785 Hyperlipidemia, unspecified: Secondary | ICD-10-CM | POA: Diagnosis present

## 2021-05-06 DIAGNOSIS — Z7901 Long term (current) use of anticoagulants: Secondary | ICD-10-CM | POA: Diagnosis not present

## 2021-05-06 DIAGNOSIS — Z681 Body mass index (BMI) 19 or less, adult: Secondary | ICD-10-CM | POA: Diagnosis not present

## 2021-05-06 DIAGNOSIS — F0390 Unspecified dementia without behavioral disturbance: Secondary | ICD-10-CM | POA: Diagnosis present

## 2021-05-06 DIAGNOSIS — E440 Moderate protein-calorie malnutrition: Secondary | ICD-10-CM | POA: Diagnosis present

## 2021-05-06 DIAGNOSIS — R112 Nausea with vomiting, unspecified: Secondary | ICD-10-CM | POA: Diagnosis present

## 2021-05-06 DIAGNOSIS — A041 Enterotoxigenic Escherichia coli infection: Secondary | ICD-10-CM | POA: Diagnosis present

## 2021-05-06 DIAGNOSIS — F17210 Nicotine dependence, cigarettes, uncomplicated: Secondary | ICD-10-CM | POA: Diagnosis present

## 2021-05-06 LAB — RENAL FUNCTION PANEL
Albumin: 3 g/dL — ABNORMAL LOW (ref 3.5–5.0)
Anion gap: 5 (ref 5–15)
BUN: 30 mg/dL — ABNORMAL HIGH (ref 8–23)
CO2: 28 mmol/L (ref 22–32)
Calcium: 8.3 mg/dL — ABNORMAL LOW (ref 8.9–10.3)
Chloride: 101 mmol/L (ref 98–111)
Creatinine, Ser: 2.11 mg/dL — ABNORMAL HIGH (ref 0.61–1.24)
GFR, Estimated: 32 mL/min — ABNORMAL LOW (ref >60)
Glucose, Bld: 338 mg/dL — ABNORMAL HIGH (ref 70–99)
Phosphorus: 3.9 mg/dL (ref 2.5–4.6)
Potassium: 4.5 mmol/L (ref 3.5–5.1)
Sodium: 134 mmol/L — ABNORMAL LOW (ref 135–145)

## 2021-05-06 LAB — ABO/RH: ABO/RH(D): O POS

## 2021-05-06 LAB — CBC
HCT: 24.3 % — ABNORMAL LOW (ref 39.0–52.0)
Hemoglobin: 7.9 g/dL — ABNORMAL LOW (ref 13.0–17.0)
MCH: 34.2 pg — ABNORMAL HIGH (ref 26.0–34.0)
MCHC: 32.5 g/dL (ref 30.0–36.0)
MCV: 105.2 fL — ABNORMAL HIGH (ref 80.0–100.0)
Platelets: 175 10*3/uL (ref 150–400)
RBC: 2.31 MIL/uL — ABNORMAL LOW (ref 4.22–5.81)
RDW: 14.3 % (ref 11.5–15.5)
WBC: 8.9 10*3/uL (ref 4.0–10.5)
nRBC: 0 % (ref 0.0–0.2)

## 2021-05-06 LAB — HEMOGLOBIN AND HEMATOCRIT, BLOOD
HCT: 27 % — ABNORMAL LOW (ref 39.0–52.0)
Hemoglobin: 8.7 g/dL — ABNORMAL LOW (ref 13.0–17.0)

## 2021-05-06 LAB — CBG MONITORING, ED
Glucose-Capillary: 354 mg/dL — ABNORMAL HIGH (ref 70–99)
Glucose-Capillary: 314 mg/dL — ABNORMAL HIGH (ref 70–99)
Glucose-Capillary: 90 mg/dL (ref 70–99)

## 2021-05-06 LAB — GLUCOSE, CAPILLARY
Glucose-Capillary: 108 mg/dL — ABNORMAL HIGH (ref 70–99)
Glucose-Capillary: 314 mg/dL — ABNORMAL HIGH (ref 70–99)

## 2021-05-06 LAB — TYPE AND SCREEN
ABO/RH(D): O POS
Antibody Screen: NEGATIVE

## 2021-05-06 MED ORDER — HYDRALAZINE HCL 25 MG PO TABS
25.0000 mg | ORAL_TABLET | Freq: Two times a day (BID) | ORAL | Status: DC
  Administered 2021-05-06 – 2021-05-08 (×5): 25 mg via ORAL
  Filled 2021-05-06 (×5): qty 1

## 2021-05-06 MED ORDER — INSULIN ASPART 100 UNIT/ML IJ SOLN
0.0000 [IU] | Freq: Three times a day (TID) | INTRAMUSCULAR | Status: DC
  Administered 2021-05-06: 7 [IU] via SUBCUTANEOUS
  Administered 2021-05-07: 1 [IU] via SUBCUTANEOUS
  Administered 2021-05-07: 5 [IU] via SUBCUTANEOUS
  Administered 2021-05-07 – 2021-05-08 (×2): 2 [IU] via SUBCUTANEOUS
  Administered 2021-05-08: 3 [IU] via SUBCUTANEOUS
  Administered 2021-05-08: 5 [IU] via SUBCUTANEOUS
  Administered 2021-05-09: 7 [IU] via SUBCUTANEOUS
  Administered 2021-05-09: 3 [IU] via SUBCUTANEOUS
  Filled 2021-05-06: qty 1

## 2021-05-06 MED ORDER — INSULIN DETEMIR 100 UNIT/ML ~~LOC~~ SOLN
12.0000 [IU] | Freq: Every day | SUBCUTANEOUS | Status: DC
  Administered 2021-05-06 – 2021-05-07 (×2): 12 [IU] via SUBCUTANEOUS
  Filled 2021-05-06 (×4): qty 0.12

## 2021-05-06 MED ORDER — QUETIAPINE FUMARATE 25 MG PO TABS
25.0000 mg | ORAL_TABLET | Freq: Every day | ORAL | Status: DC
  Administered 2021-05-06 – 2021-05-08 (×4): 25 mg via ORAL
  Filled 2021-05-06 (×4): qty 1

## 2021-05-06 MED ORDER — INSULIN DETEMIR 100 UNIT/ML ~~LOC~~ SOLN
8.0000 [IU] | Freq: Every day | SUBCUTANEOUS | Status: DC
  Administered 2021-05-06: 8 [IU] via SUBCUTANEOUS
  Filled 2021-05-06 (×2): qty 0.08

## 2021-05-06 MED ORDER — MEMANTINE HCL 10 MG PO TABS
5.0000 mg | ORAL_TABLET | Freq: Two times a day (BID) | ORAL | Status: DC
  Administered 2021-05-06 – 2021-05-09 (×8): 5 mg via ORAL
  Filled 2021-05-06 (×8): qty 1

## 2021-05-06 MED ORDER — HYDROXYZINE HCL 10 MG PO TABS: 10.0000 mg | ORAL_TABLET | Freq: Two times a day (BID) | ORAL | Status: DC | PRN

## 2021-05-06 MED ORDER — DONEPEZIL HCL 5 MG PO TABS
5.0000 mg | ORAL_TABLET | Freq: Every day | ORAL | Status: DC
  Administered 2021-05-06 – 2021-05-08 (×4): 5 mg via ORAL
  Filled 2021-05-06 (×4): qty 1

## 2021-05-06 MED ORDER — ATENOLOL 25 MG PO TABS
25.0000 mg | ORAL_TABLET | Freq: Every day | ORAL | Status: DC
  Administered 2021-05-06 – 2021-05-09 (×4): 25 mg via ORAL
  Filled 2021-05-06 (×4): qty 1

## 2021-05-06 MED ORDER — FOLIC ACID 1 MG PO TABS
1.0000 mg | ORAL_TABLET | Freq: Every day | ORAL | Status: DC
  Administered 2021-05-06 – 2021-05-09 (×4): 1 mg via ORAL
  Filled 2021-05-06 (×4): qty 1

## 2021-05-06 MED ORDER — THIAMINE HCL 100 MG PO TABS
100.0000 mg | ORAL_TABLET | Freq: Every day | ORAL | Status: DC
  Administered 2021-05-06 – 2021-05-09 (×4): 100 mg via ORAL
  Filled 2021-05-06 (×4): qty 1

## 2021-05-06 MED ORDER — ATORVASTATIN CALCIUM 40 MG PO TABS
40.0000 mg | ORAL_TABLET | Freq: Every day | ORAL | Status: DC
  Administered 2021-05-06 – 2021-05-09 (×4): 40 mg via ORAL
  Filled 2021-05-06 (×4): qty 1

## 2021-05-06 MED ORDER — ENSURE ENLIVE PO LIQD
237.0000 mL | Freq: Two times a day (BID) | ORAL | Status: DC
  Administered 2021-05-07 – 2021-05-09 (×6): 237 mL via ORAL

## 2021-05-06 MED ORDER — APIXABAN 2.5 MG PO TABS
2.5000 mg | ORAL_TABLET | Freq: Two times a day (BID) | ORAL | Status: DC
  Administered 2021-05-06 – 2021-05-09 (×8): 2.5 mg via ORAL
  Filled 2021-05-06 (×8): qty 1

## 2021-05-06 NOTE — TOC Progression Note (Signed)
Transition of Care [facility]) - Progression Note    Patient Details  Name: Joshua Morales MRN: [id number] Date of Birth: Apr 13, 1946  Transition of [facility]) CM/SW Contact  Karn Cassis, [place] Phone Number: 05/06/2021, 8:23 AM  Clinical Narrative:   [facility] notification completed. Notification ID: [id number].         Expected Discharge Plan and Services                                                 Social Determinants of Health (SDOH) Interventions    Readmission Risk Interventions No flowsheet data found.

## 2021-05-06 NOTE — H&P (Signed)
History and Physical    Joshua Morales:[id number] DOB: Jun 07, 1946 DOA: 05/05/2021  PCP: [facility]   Patient coming from: Home.  I have personally briefly reviewed patient's old medical records in [facility]  Chief Complaint: Vomiting and diarrhea.  HPI: Joshua Morales is a 75 y.o. male with medical history significant of agent orange exposure, atrial thrombus, stage IIIb CRD, alcoholic dementia, type 2 diabetes, hypertension, microcytic anemia who was brought to the emergency department due to having multiple episodes of emesis and diarrhea in the past 2 days.  History is taken from the patient's daughter as he is unable to elaborate and was only oriented x1.  He is able to answer simple questions and denied having headache, abdominal, back or chest pain.  He did not have any specific complaints at the time of interview and examination.  ED Course: Initial vital signs were temperature 98 F, pulse 68, respirations 17, BP 150/75 mmHg O2 sat 98% on room air.  The patient received 1000 mL LR bolus, 1000 mL NS bolus and Zofran disintegrating tablet.  Lab work: His urinalysis showed glucosuria of more than 500 and proteinuria of 100 mg/dL.  There was small hemoglobinuria and rare bacteria.  CBC showed a white count 9.4, hemoglobin 10.2 g/dL and platelets 756.  CMP showed a glucose of 439, BUN of 33 and creatinine of 2.26 mg/dL.  The rest of the CMP values were normal.  Baseline creatinine level is around 1.80 mg/dL.  Review of Systems: As per HPI otherwise all other systems reviewed and are negative.  Past Medical History:  Diagnosis Date   Agent orange exposure    Atrial thrombus 05/05/2021   CRD (chronic renal disease), stage 3b (HCC) 05/05/2021   Dementia (HCC)    Diabetes mellitus without complication (HCC)    Hypertension    Macrocytic anemia 05/05/2021    Past Surgical History:  Procedure Laterality Date   BUBBLE STUDY  04/07/2021   Procedure: BUBBLE STUDY;   Surgeon: Jonelle Sidle, MD;  Location: [facility];  Service: Cardiovascular;;  with TEE   LUNG REMOVAL, PARTIAL     TEE WITHOUT CARDIOVERSION N/A 04/07/2021   Procedure: TRANSESOPHAGEAL ECHOCARDIOGRAM (TEE);  Surgeon: Jonelle Sidle, MD;  Location: [facility];  Service: Cardiovascular;  Laterality: N/A;   TOE AMPUTATION      Social History  reports that he has been smoking cigarettes. He does not have any smokeless tobacco history on file. He reports current alcohol use. He reports that he does not use drugs.  No Known Allergies  Family History  Problem Relation Age of Onset   Congestive Heart Failure Mother    Diabetes Mellitus I Mother    Dementia Father    Throat cancer Brother    Prior to Admission medications   Medication Sig Start Date End Date Taking? Authorizing Provider  ACIDOPHILUS LACTOBACILLUS PO Take 2 capsules by mouth in the morning and at bedtime.   Yes [provider]  apixaban (ELIQUIS) 2.5 MG TABS tablet Take 1 tablet (2.5 mg total) by mouth 2 (two) times daily. 04/08/21  Yes Johnson, Clanford L, MD  atenolol (TENORMIN) 25 MG tablet Take 1 tablet (25 mg total) by mouth daily. 04/08/21  Yes Johnson, Clanford L, MD  atorvastatin (LIPITOR) 40 MG tablet Take 40 mg by mouth daily.   Yes [provider]  Cholecalciferol (D3-1000) 25 MCG (1000 UT) tablet Take 2,000 Units by mouth daily.   Yes [provider]  donepezil (ARICEPT) 5 MG tablet Take 1 tablet (5 mg total) by mouth at bedtime. 04/08/21  Yes Johnson, Clanford L, MD  folic acid (FOLVITE) 1 MG tablet Take 1 tablet (1 mg total) by mouth daily. 04/08/21  Yes Johnson, Clanford L, MD  hydrOXYzine (ATARAX/VISTARIL) 10 MG tablet Take 10 mg by mouth 2 (two) times daily as needed for itching.   Yes [provider]  insulin aspart (NOVOLOG) 100 UNIT/ML FlexPen Inject 2 Units into the skin 3 (three) times daily with meals. IF EATS 50% OR MORE OF MEAL. Patient taking differently: Inject 4 Units into  the skin 3 (three) times daily with meals. IF EATS 50% OR MORE OF MEAL. 04/08/21  Yes Johnson, Clanford L, MD  insulin detemir (LEVEMIR) 100 UNIT/ML FlexPen Inject 8 Units into the skin daily.   Yes [provider]  memantine (NAMENDA) 5 MG tablet Take 1 tablet (5 mg total) by mouth 2 (two) times daily. 04/08/21  Yes Johnson, Clanford L, MD  mirtazapine (REMERON) 15 MG tablet TAKE ONE TABLET BY MOUTH AT BEDTIME TO STIMULATE APPETITE 08/07/20  Yes [provider]  Multiple Vitamin (MULTIVITAMIN WITH MINERALS) TABS tablet Take 1 tablet by mouth daily. 04/08/21  Yes Johnson, Clanford L, MD  QUEtiapine (SEROQUEL) 25 MG tablet Take 1 tablet (25 mg total) by mouth at bedtime. 04/08/21  Yes Johnson, Clanford L, MD  thiamine 100 MG tablet Take 1 tablet (100 mg total) by mouth daily. 04/08/21  Yes Johnson, Clanford L, MD  insulin glargine (LANTUS) 100 unit/mL SOPN Inject 8 Units into the skin daily. Patient not taking: Reported on 05/05/2021 04/08/21   Cleora Fleet, MD  Insulin Pen Needle 31G X 5 MM MISC 1 Device by Does not apply route as directed. 04/08/21   Johnson, Clanford L, MD  polyethylene glycol (MIRALAX / GLYCOLAX) 17 g packet Take 17 g by mouth daily as needed for mild constipation. Patient not taking: No sig reported 04/08/21   Cleora Fleet, MD    Physical Exam: Vitals:   05/06/21 0200 05/06/21 0230 05/06/21 0300 05/06/21 0330  BP: 129/77 (!) 182/71 (!) 171/80 (!) 159/69  Pulse: (!) 58 (!) 58 (!) 53 60  Resp: (!) 8 (!) 9 12 (!) 9  Temp:      TempSrc:      SpO2: 97% 96% 98% 97%  Weight:      Height:        Constitutional: NAD, calm, comfortable Eyes: PERRL, lids and conjunctivae normal ENMT: Mucous membranes are moist. Posterior pharynx clear of any exudate or lesions. Neck: normal, supple, no masses, no thyromegaly Respiratory: Clear to auscultation bilaterally, no wheezing, no crackles. Normal respiratory effort. No accessory muscle use.  Cardiovascular: Regular rate  and rhythm, no murmurs / rubs / gallops. No extremity edema. 2+ pedal pulses. No carotid bruits.  Abdomen: No distention.  Bowel sounds positive.  Soft, no tenderness, no masses palpated. No hepatosplenomegaly.  Musculoskeletal: Mild generalized weakness.  No clubbing / cyanosis. Good ROM, no contractures. Normal muscle tone.  Skin: Hyperpigmented pretibial area.  No acute rashes, lesions, ulcers on limited dermatological examination. Neurologic: CN 2-12 grossly intact. Sensation intact, DTR normal. Strength 5/5 in all 4.  Psychiatric:  Alert and oriented x 1. Normal mood.   Labs on Admission: I have personally reviewed following labs and imaging studies  CBC: Recent Labs  Lab 05/05/21 1418  WBC 9.4  HGB 10.2*  HCT 32.0*  MCV 104.9*  PLT 233  Basic Metabolic Panel: Recent Labs  Lab 05/05/21 1418  NA 134*  K 4.8  CL 98  CO2 25  GLUCOSE 439*  BUN 33*  CREATININE 2.26*  CALCIUM 9.1    GFR: Estimated Creatinine Clearance: 23.1 mL/min (A) (by C-G formula based on SCr of 2.26 mg/dL (H)).  Liver Function Tests: Recent Labs  Lab 05/05/21 1418  AST 32  ALT 41  ALKPHOS 63  BILITOT 0.7  PROT 7.7  ALBUMIN 4.1    Urine analysis:    Component Value Date/Time   COLORURINE YELLOW 05/05/2021 2105   APPEARANCEUR CLEAR 05/05/2021 2105   LABSPEC 1.015 05/05/2021 2105   PHURINE 6.0 05/05/2021 2105   GLUCOSEU >=500 (A) 05/05/2021 2105   HGBUR SMALL (A) 05/05/2021 2105   BILIRUBINUR NEGATIVE 05/05/2021 2105   KETONESUR NEGATIVE 05/05/2021 2105   PROTEINUR 100 (A) 05/05/2021 2105   NITRITE NEGATIVE 05/05/2021 2105   LEUKOCYTESUR NEGATIVE 05/05/2021 2105    Radiological Exams on Admission: No results found.  EKG: Independently reviewed.  Vent. rate 68 BPM PR interval 146 ms QRS duration 87 ms QT/QTcB 438/466 ms P-R-T axes 77 -14 78 Sinus rhythm Anteroseptal infarct, age indeterminate  Assessment/Plan Principal Problem:   AKI (acute kidney injury) on stage 3b  CRD (HCC) In the setting of   Multiple episodes of vomiting and diarrhea. Observation/telemetry. Continue LR at 75 mL/h. Avoid nephrotoxic medications. Avoid hypotension. Monitor intake and output. Follow-up renal function electrolytes.  Active Problems:   Diabetes mellitus type 2, uncontrolled (HCC) Carbohydrate modified diet. Continue Levemir 8 units at bedtime. CBG monitoring with RI SS.    HTN (hypertension) Continue atenolol 25 mg p.o. daily. Monitor BP and heart rate.    Dementia associated with alcoholism (HCC) Continue donezepil 5 mg p.o. nightly. Continue memantine 5 mg p.o. twice daily.    Atrial thrombus Continue apixaban 2.5 mg p.o. twice daily.    Macrocytic anemia Continue vitamin supplementation. Monitor H&H.  Transfuse if needed.    DVT prophylaxis: On apixaban. Code Status:   Full code. Family Communication:  His daughter was by bedside. Disposition Plan:   Patient is from:  Home.  Anticipated DC to:  Home.  Anticipated DC date:  05/07/2021.  Anticipated DC barriers: Clinical status.  Consults called:   Admission status:  Observation/telemetry.   Severity of Illness: High severity in the setting of AKI secondary to dehydration due to nausea, vomiting and diarrhea.  Bobette Mo MD [facility]  How to contact the [facility] Attending or Consulting provider 7A - 7P or covering provider during after hours 7P -7A, for this patient?   Check the care team in [facility] and look for a) attending/consulting [facility] provider listed and b) the [facility] team listed Log into [email or web address] and use [place]'s universal password to access. If you do not have the password, please contact the hospital operator. Locate the [facility] provider you are looking for under [facility] and page to a number that you can be directly reached. If you still have difficulty reaching the provider, please page the [facility] (Director on Call) for the Hospitalists listed on amion for  assistance.  05/06/2021, 5:02 AM   This document was prepared using Dragon voice recognition software and may contain some unintended transcription errors.

## 2021-05-06 NOTE — ED Notes (Signed)
Attempted to call report-no answer 

## 2021-05-06 NOTE — ED Notes (Signed)
Cbg completed this morning at 0830. RN notified.

## 2021-05-06 NOTE — ED Notes (Signed)
Please do not give pt straws per daughter

## 2021-05-06 NOTE — Progress Notes (Addendum)
I have seen and assessed patient and agree with Dr. Robb Matar assessment and plan.  Patient is 75 year old gentleman medical history significant for agent orange exposure, atrial thrombus, stage IIIb CKD, alcoholic dementia, type 2 diabetes, hypertension presented to the ED with nausea vomiting and diarrhea.  Patient noted to be in acute kidney injury and dehydrated.  Patient placed on IV fluids.  Will check stool studies.  Continue anticoagulation for atrial thrombus.  Increase Levemir to 12 units daily.  Start hydralazine 25 mg twice daily for better blood pressure control.  Advance diet to a full liquid diet.  Supportive care.  No charge.

## 2021-05-06 NOTE — ED Notes (Signed)
Pt given a warm blanket and room tightied.

## 2021-05-06 NOTE — ED Notes (Signed)
Pts cbg was 90. RN notified.

## 2021-05-07 DIAGNOSIS — I10 Essential (primary) hypertension: Secondary | ICD-10-CM

## 2021-05-07 DIAGNOSIS — E1165 Type 2 diabetes mellitus with hyperglycemia: Secondary | ICD-10-CM

## 2021-05-07 DIAGNOSIS — Z720 Tobacco use: Secondary | ICD-10-CM

## 2021-05-07 DIAGNOSIS — E440 Moderate protein-calorie malnutrition: Secondary | ICD-10-CM | POA: Insufficient documentation

## 2021-05-07 DIAGNOSIS — D539 Nutritional anemia, unspecified: Secondary | ICD-10-CM

## 2021-05-07 DIAGNOSIS — N1832 Chronic kidney disease, stage 3b: Secondary | ICD-10-CM

## 2021-05-07 DIAGNOSIS — A0472 Enterocolitis due to Clostridium difficile, not specified as recurrent: Secondary | ICD-10-CM

## 2021-05-07 LAB — GLUCOSE, CAPILLARY
Glucose-Capillary: 112 mg/dL — ABNORMAL HIGH (ref 70–99)
Glucose-Capillary: 182 mg/dL — ABNORMAL HIGH (ref 70–99)
Glucose-Capillary: 263 mg/dL — ABNORMAL HIGH (ref 70–99)
Glucose-Capillary: 219 mg/dL — ABNORMAL HIGH (ref 70–99)

## 2021-05-07 LAB — CBC WITH DIFFERENTIAL/PLATELET
Abs Immature Granulocytes: 0.02 10*3/uL (ref 0.00–0.07)
Basophils Absolute: 0 10*3/uL (ref 0.0–0.1)
Basophils Relative: 0 %
Eosinophils Absolute: 0.2 10*3/uL (ref 0.0–0.5)
Eosinophils Relative: 3 %
HCT: 27.3 % — ABNORMAL LOW (ref 39.0–52.0)
Hemoglobin: 8.8 g/dL — ABNORMAL LOW (ref 13.0–17.0)
Immature Granulocytes: 0 %
Lymphocytes Relative: 29 %
Lymphs Abs: 2.1 10*3/uL (ref 0.7–4.0)
MCH: 33.7 pg (ref 26.0–34.0)
MCHC: 32.2 g/dL (ref 30.0–36.0)
MCV: 104.6 fL — ABNORMAL HIGH (ref 80.0–100.0)
Monocytes Absolute: 0.8 10*3/uL (ref 0.1–1.0)
Monocytes Relative: 11 %
Neutro Abs: 4.2 10*3/uL (ref 1.7–7.7)
Neutrophils Relative %: 57 %
Platelets: 177 10*3/uL (ref 150–400)
RBC: 2.61 MIL/uL — ABNORMAL LOW (ref 4.22–5.81)
RDW: 13.9 % (ref 11.5–15.5)
WBC: 7.3 10*3/uL (ref 4.0–10.5)
nRBC: 0 % (ref 0.0–0.2)

## 2021-05-07 LAB — RENAL FUNCTION PANEL
Albumin: 3.2 g/dL — ABNORMAL LOW (ref 3.5–5.0)
Anion gap: 7 (ref 5–15)
BUN: 27 mg/dL — ABNORMAL HIGH (ref 8–23)
CO2: 30 mmol/L (ref 22–32)
Calcium: 9.2 mg/dL (ref 8.9–10.3)
Chloride: 101 mmol/L (ref 98–111)
Creatinine, Ser: 1.91 mg/dL — ABNORMAL HIGH (ref 0.61–1.24)
GFR, Estimated: 37 mL/min — ABNORMAL LOW (ref >60)
Glucose, Bld: 173 mg/dL — ABNORMAL HIGH (ref 70–99)
Phosphorus: 3.7 mg/dL (ref 2.5–4.6)
Potassium: 4.4 mmol/L (ref 3.5–5.1)
Sodium: 138 mmol/L (ref 135–145)

## 2021-05-07 LAB — C DIFFICILE QUICK SCREEN W PCR REFLEX
C Diff antigen: POSITIVE — AB
C Diff toxin: NEGATIVE

## 2021-05-07 LAB — CLOSTRIDIUM DIFFICILE BY PCR, REFLEXED: Toxigenic C. Difficile by PCR: NEGATIVE

## 2021-05-07 LAB — MAGNESIUM: Magnesium: 1.8 mg/dL (ref 1.7–2.4)

## 2021-05-07 MED ORDER — SODIUM CHLORIDE 0.9 % IV SOLN: INTRAVENOUS | Status: DC

## 2021-05-07 MED ORDER — VANCOMYCIN HCL 125 MG PO CAPS
125.0000 mg | ORAL_CAPSULE | Freq: Four times a day (QID) | ORAL | Status: DC
  Administered 2021-05-07 – 2021-05-09 (×10): 125 mg via ORAL
  Filled 2021-05-07 (×13): qty 1

## 2021-05-07 MED ORDER — MAGNESIUM SULFATE 2 GM/50ML IV SOLN
2.0000 g | Freq: Once | INTRAVENOUS | Status: AC
  Administered 2021-05-07: 2 g via INTRAVENOUS
  Filled 2021-05-07: qty 50

## 2021-05-07 NOTE — Progress Notes (Signed)
Patient has reported feeling better today than in recent days. Has enjoyed another strawberry ensure, third of the day. He also tolerated some graham crackers. Good appetite with no nausea and vomiting. No pain to the abd. He is continent of urine, when able to get to his urinal. He is however incontinent of stool at this time, as he dribbles a bit at a time and cannot tell until it's too late. Is tolerating oral meds. Very pleasant and cooperative, no confusion noted. Somewhat forgetful in regards to medical history.

## 2021-05-07 NOTE — Progress Notes (Signed)
Initial Nutrition Assessment  DOCUMENTATION CODES:   Non-severe (moderate) malnutrition in context of chronic illness  INTERVENTION:  Ensure Enlive po BID, each supplement provides 350 kcal and 20 grams of protein   Multivitamin daily  NUTRITION DIAGNOSIS:   Moderate Malnutrition related to chronic illness (dementia) as evidenced by moderate fat depletion, moderate muscle depletion.   GOAL:  Patient will meet greater than or equal to 90% of their needs   MONITOR:  PO intake, Supplement acceptance, Labs, Weight trends  REASON FOR ASSESSMENT:   Malnutrition Screening Tool    ASSESSMENT: Patient is a underweight 75 yo male with hx of alcoholic dementia, CKD3, DM2, HTN who presents with nausea, vomiting and diarrhea.   Patient drank 600 ml fluid at breakfast but no solid food?? Dysphagia 3 diet. Patient unable to provide nutrition history. He doesn't remember what he ate today. He endorses being hungry and asked for something to eat. Will increase his portions of protein. He likes strawberry Ensure. Provided one for him since it will be several hours until dinner arrives.   Limited available weight history and pt unable to recall usual weight.   Medications reviewed and include: Aricept, folvite, insulin, levemir, namenda  IVF-NS@ 50 ml/hr  Labs: BMP Latest Ref Rng & Units 05/07/2021 05/06/2021 05/05/2021  Glucose 70 - 99 mg/dL 301(S) 010(X) 323(F)  BUN 8 - 23 mg/dL 57(D) 22(G) 25(K)  Creatinine 0.61 - 1.24 mg/dL 2.70(W) 2.37(S) 2.83(T)  Sodium 135 - 145 mmol/L 138 134(L) 134(L)  Potassium 3.5 - 5.1 mmol/L 4.4 4.5 4.8  Chloride 98 - 111 mmol/L 101 101 98  CO2 22 - 32 mmol/L 30 28 25   Calcium 8.9 - 10.3 mg/dL 9.2 ) 9.1      NUTRITION - FOCUSED PHYSICAL EXAM: Nutrition-Focused physical exam completed. Findings are moderate buccal, orbital fat depletion, moderate clavicle, scapular, temporal muscle depletion, and no edema.      Diet Order:   Diet Order              DIET DYS 3 Room service appropriate? Yes; Fluid consistency: Thin  Diet effective now                   EDUCATION NEEDS:  Not appropriate for education at this time  Skin:  Skin Assessment: Reviewed RN Assessment  Last BM:  9/1 type 7  Height:   Ht Readings from Last 1 Encounters:  05/05/21 5\' 10"  (1.778 m)    Weight:   Wt Readings from Last 1 Encounters:  05/05/21 56 kg    Ideal Body Weight:   75 kg  BMI:  Body mass index is 17.71 kg/m.  Estimated Nutritional Needs:   Kcal:   Protein:  73-78 gr  Fluid:  2 liters daily  05/07/21 MS,RD,CSG,LDN Contact: [phone]

## 2021-05-07 NOTE — Progress Notes (Signed)
Patient daughter given an update on condition.

## 2021-05-07 NOTE — Progress Notes (Signed)
PROGRESS NOTE    Joshua Morales  XLK:[id number] DOB: 11-21-1945 DOA: 05/05/2021 [facility]: [facility]    No chief complaint on file.   Brief Narrative:  Patient 75 year old gentleman prior history of agent orange exposure, atrial thrombus, stage IIIb chronic kidney disease, alcoholic dementia, type 2 diabetes, hypertension, microcytic anemia presented to the ED with multiple episodes of emesis and diarrhea.  Patient also noted to be in acute kidney injury.  Patient placed on IV fluids.  C. difficile PCR obtained was positive for C. difficile antigen, negative for toxin, PCR pending.  Patient noted to have ongoing watery stools and subsequently placed empirically on oral vancomycin.   Assessment & Plan:   Principal Problem:   AKI (acute kidney injury) (HCC) Active Problems:   Diabetes mellitus type 2, uncontrolled (HCC)   HTN (hypertension)   Tobacco abuse   Dementia associated with alcoholism (HCC)   Atrial thrombus   Macrocytic anemia   CRD (chronic renal disease), stage 3b (HCC)   Malnutrition of moderate degree   C. difficile diarrhea   #1 acute kidney injury on chronic kidney disease stage IIIb -Secondary to prerenal azotemia in the setting of GI losses of emesis and diarrhea -Renal function improving and trending down with hydration. -Decrease IV fluids to 50 cc an hour. -Supportive care.  2.  C. difficile diarrhea -Patient presented multiple episodes of emesis, watery stools.  C. difficile positive for antigen, negative for toxin.  C. difficile PCR pending. -Patient with ongoing watery loose stools per RN and as such we will treat empirically with a 10-day course of oral vancomycin. -IV fluids. -Supportive care.  3.  Uncontrolled diabetes mellitus type 2 -Hemoglobin A1c 12.1 (04/05/2021) -CBG 112 this morning. -Diet being advanced. -Continue Levemir 12 units daily, SSI.  4.  Hypertension -Continue home regimen atenolol. -Continue hydralazine 25 mg p.o.  twice daily.  5.  Dementia associated with alcoholism -Stable. -Continue Aricept, Namenda, Seroquel.  6.  Hyperlipidemia -Continue statin.  7.  History of atrial thrombus -Continue apixaban  8.  Macrocytic anemia -Patient with no overt bleeding. -H&H stable. -Follow.  9.  Moderate protein calorie malnutrition -Continue attritional supplementation.    DVT prophylaxis: Eliquis Code Status: Full Family Communication: Updated patient.  No family at bedside. Disposition:   Status is: Inpatient  Remains inpatient appropriate because:Inpatient level of care appropriate due to severity of illness  Dispo: The patient is from: Home              Anticipated d/c is to: Home              Patient currently is not medically stable to d/c.   Difficult to place patient No       Consultants:  None  Procedures:  None  Antimicrobials:  Oral vancomycin 05/07/2021   Subjective: Per RN patient still with watery loose stool had 1 large watery stool early on today and a small stool which is leaking.  Patient denies any chest pain.  No shortness of breath.  No abdominal pain.  Tolerating oral intake.  Objective: Vitals:   05/06/21 2100 05/07/21 0600 05/07/21 0741 05/07/21 1606  BP: (!) 164/73 (!) 180/81 128/62 (!) 167/66  Pulse: 89 (!) 54 (!) 52 (!) 59  Resp: 14 16 15 17   Temp:  98.9 F (37.2 C) 98.1 F (36.7 C) 97.7 F (36.5 C)  TempSrc:  Oral Oral Oral  SpO2: 100% 100% 100% 100%  Weight:      Height:  Intake/Output Summary (Last 24 hours) at 05/07/2021 1941 Last data filed at 05/07/2021 1700 Gross per 24 hour  Intake 3644.28 ml  Output 1000 ml  Net 2644.28 ml   Filed Weights   05/05/21 1401  Weight: 56 kg    Examination:  General exam: : NAD Respiratory system: CTA B, no wheezing, no crackles, no rhonchi.  Normal respiratory effort.  Speaking in full sentences. Cardiovascular system: Regular rate and rhythm no murmurs rubs or gallops.  No JVD.  No lower  extremity edema.  Gastrointestinal system: Abdomen soft, nontender, nondistended, positive bowel sounds.  No rebound.  No guarding. Central nervous system: Alert and oriented. No focal neurological deficits. Extremities: Symmetric 5 x 5 power. Skin: No rashes, lesions or ulcers Psychiatry: Judgement and insight appear normal. Mood & affect appropriate.  Data Reviewed: I have personally reviewed following labs and imaging studies  CBC: Recent Labs  Lab 05/05/21 1418 05/06/21 0414 05/06/21 1048 05/07/21 0553  WBC 9.4 8.9  --  7.3  NEUTROABS  --   --   --  4.2  HGB 10.2* 7.9* 8.7* 8.8*  HCT 32.0* 24.3* 27.0* 27.3*  MCV 104.9* 105.2*  --  104.6*  PLT 233 175  --  177    Basic Metabolic Panel: Recent Labs  Lab 05/05/21 1418 05/06/21 0414 05/07/21 0553  NA 134* 134* 138  K 4.8 4.5 4.4  CL 98 101 101  CO2 25 28 30   GLUCOSE 439* 338* 173*  BUN 33* 30* 27*  CREATININE 2.26* 2.11* 1.91*  CALCIUM 9.1 8.3* 9.2  MG  --   --  1.8  PHOS  --  3.9 3.7    GFR: Estimated Creatinine Clearance: 27.3 mL/min (A) (by C-G formula based on SCr of 1.91 mg/dL (H)).  Liver Function Tests: Recent Labs  Lab 05/05/21 1418 05/06/21 0414 05/07/21 0553  AST 32  --   --   ALT 41  --   --   ALKPHOS 63  --   --   BILITOT 0.7  --   --   PROT 7.7  --   --   ALBUMIN 4.1 3.0* 3.2*    CBG: Recent Labs  Lab 05/06/21 1833 05/06/21 2222 05/07/21 0749 05/07/21 1026 05/07/21 1628  GLUCAP 108* 314* 112* 182* 263*     Recent Results (from the past 240 hour(s))  Resp Panel by RT-PCR (Flu A&B, Covid) Nasopharyngeal Swab     Status: None   Collection Time: 05/05/21  4:02 PM   Specimen: Nasopharyngeal Swab; Nasopharyngeal(NP) swabs in vial transport medium  Result Value Ref Range Status   SARS Coronavirus 2 by RT PCR NEGATIVE NEGATIVE Final    Comment: (NOTE) SARS-CoV-2 target nucleic acids are NOT DETECTED.  The SARS-CoV-2 RNA is generally detectable in upper respiratory specimens during  the acute phase of infection. The lowest concentration of SARS-CoV-2 viral copies this assay can detect is 138 copies/mL. A negative result does not preclude SARS-Cov-2 infection and should not be used as the sole basis for treatment or other patient management decisions. A negative result may occur with  improper specimen collection/handling, submission of specimen other than nasopharyngeal swab, presence of viral mutation(s) within the areas targeted by this assay, and inadequate number of viral copies(<138 copies/mL). A negative result must be combined with clinical observations, patient history, and epidemiological information. The expected result is Negative.  Fact Sheet for Patients:  05/07/21  Fact Sheet for Healthcare Providers:  BloggerCourse.com  This test is no t yet  approved or cleared by the [place] and  has been authorized for detection and/or diagnosis of SARS-CoV-2 by FDA under an Emergency Use Authorization (EUA). This EUA will remain  in effect (meaning this test can be used) for the duration of the COVID-19 declaration under Section 564(b)(1) of the Act, 21 U.S.C.section 360bbb-3(b)(1), unless the authorization is terminated  or revoked sooner.       Influenza A by PCR NEGATIVE NEGATIVE Final   Influenza B by PCR NEGATIVE NEGATIVE Final    Comment: (NOTE) The Xpert Xpress SARS-CoV-2/FLU/RSV plus assay is intended as an aid in the diagnosis of influenza from Nasopharyngeal swab specimens and should not be used as a sole basis for treatment. Nasal washings and aspirates are unacceptable for Xpert Xpress SARS-CoV-2/FLU/RSV testing.  Fact Sheet for Patients: BloggerCourse.com  Fact Sheet for Healthcare Providers: SeriousBroker.it  This test is not yet approved or cleared by the [place] FDA and has been authorized for detection and/or  diagnosis of SARS-CoV-2 by FDA under an Emergency Use Authorization (EUA). This EUA will remain in effect (meaning this test can be used) for the duration of the COVID-19 declaration under Section 564(b)(1) of the Act, 21 U.S.C. section 360bbb-3(b)(1), unless the authorization is terminated or revoked.  Performed at [facility], [address] [place], [place] [address]   C Difficile Quick Screen w PCR reflex     Status: Abnormal   Collection Time: 05/07/21 10:57 AM   Specimen: Stool  Result Value Ref Range Status   C Diff antigen POSITIVE (A) NEGATIVE Final   C Diff toxin NEGATIVE NEGATIVE Final   C Diff interpretation Results are indeterminate. See PCR results.  Final    Comment: Performed at [facility], [address] [place], [place] [address]         Radiology Studies: No results found.      Scheduled Meds:  apixaban  2.5 mg Oral BID   atenolol  25 mg Oral Daily   atorvastatin  40 mg Oral Daily   donepezil  5 mg Oral QHS   feeding supplement  237 mL Oral BID BM   folic acid  1 mg Oral Daily   hydrALAZINE  25 mg Oral BID   insulin aspart  0-9 Units Subcutaneous TID WC   insulin detemir  12 Units Subcutaneous QHS   memantine  5 mg Oral BID   QUEtiapine  25 mg Oral QHS   thiamine  100 mg Oral Daily   vancomycin  125 mg Oral QID   Continuous Infusions:  sodium chloride 50 mL/hr at 05/07/21 0957     LOS: 1 day    Time spent: 40 minutes    Ramiro Harvest, MD [facility]   To contact the attending provider between 7A-7P or the covering provider during after hours 7P-7A, please log into the web site [email or web address] and access using universal  password for that web site. If you do not have the password, please call the hospital operator.  05/07/2021, 7:41 PM

## 2021-05-08 DIAGNOSIS — A041 Enterotoxigenic Escherichia coli infection: Principal | ICD-10-CM | POA: Diagnosis present

## 2021-05-08 LAB — CBC
HCT: 26.5 % — ABNORMAL LOW (ref 39.0–52.0)
Hemoglobin: 8.7 g/dL — ABNORMAL LOW (ref 13.0–17.0)
MCH: 34.4 pg — ABNORMAL HIGH (ref 26.0–34.0)
MCHC: 32.8 g/dL (ref 30.0–36.0)
MCV: 104.7 fL — ABNORMAL HIGH (ref 80.0–100.0)
Platelets: 157 10*3/uL (ref 150–400)
RBC: 2.53 MIL/uL — ABNORMAL LOW (ref 4.22–5.81)
RDW: 14 % (ref 11.5–15.5)
WBC: 7.8 10*3/uL (ref 4.0–10.5)
nRBC: 0 % (ref 0.0–0.2)

## 2021-05-08 LAB — GASTROINTESTINAL PANEL BY PCR, STOOL (REPLACES STOOL CULTURE)

## 2021-05-08 LAB — RENAL FUNCTION PANEL
Albumin: 3.1 g/dL — ABNORMAL LOW (ref 3.5–5.0)
Anion gap: 8 (ref 5–15)
BUN: 34 mg/dL — ABNORMAL HIGH (ref 8–23)
CO2: 28 mmol/L (ref 22–32)
Calcium: 8.9 mg/dL (ref 8.9–10.3)
Chloride: 98 mmol/L (ref 98–111)
Creatinine, Ser: 1.89 mg/dL — ABNORMAL HIGH (ref 0.61–1.24)
GFR, Estimated: 37 mL/min — ABNORMAL LOW (ref >60)
Glucose, Bld: 331 mg/dL — ABNORMAL HIGH (ref 70–99)
Phosphorus: 3.7 mg/dL (ref 2.5–4.6)
Potassium: 4.9 mmol/L (ref 3.5–5.1)
Sodium: 134 mmol/L — ABNORMAL LOW (ref 135–145)

## 2021-05-08 LAB — MAGNESIUM: Magnesium: 2.1 mg/dL (ref 1.7–2.4)

## 2021-05-08 LAB — GLUCOSE, CAPILLARY
Glucose-Capillary: 249 mg/dL — ABNORMAL HIGH (ref 70–99)
Glucose-Capillary: 159 mg/dL — ABNORMAL HIGH (ref 70–99)
Glucose-Capillary: 281 mg/dL — ABNORMAL HIGH (ref 70–99)
Glucose-Capillary: 231 mg/dL — ABNORMAL HIGH (ref 70–99)

## 2021-05-08 MED ORDER — INSULIN DETEMIR 100 UNIT/ML ~~LOC~~ SOLN
14.0000 [IU] | Freq: Every day | SUBCUTANEOUS | Status: DC
  Administered 2021-05-08: 14 [IU] via SUBCUTANEOUS
  Filled 2021-05-08 (×2): qty 0.14

## 2021-05-08 MED ORDER — HYDRALAZINE HCL 25 MG PO TABS
25.0000 mg | ORAL_TABLET | Freq: Three times a day (TID) | ORAL | Status: DC
Start: 2021-05-08 — End: 2021-05-09
  Administered 2021-05-08 – 2021-05-09 (×3): 25 mg via ORAL
  Filled 2021-05-08 (×3): qty 1

## 2021-05-08 MED ORDER — INSULIN ASPART 100 UNIT/ML IJ SOLN
2.0000 [IU] | Freq: Three times a day (TID) | INTRAMUSCULAR | Status: DC
  Administered 2021-05-08: 2 [IU] via SUBCUTANEOUS

## 2021-05-08 MED ORDER — INSULIN ASPART 100 UNIT/ML IJ SOLN
4.0000 [IU] | Freq: Three times a day (TID) | INTRAMUSCULAR | Status: DC
  Administered 2021-05-08: 4 [IU] via SUBCUTANEOUS

## 2021-05-08 NOTE — Progress Notes (Signed)
Date and time results received: 05/08/21 1632 (use smartphrase ".now" to insert current time)  Test: stool sample  Critical Value: ETEC   Name of Provider Notified: Ramiro Harvest MD   Orders Received? Or Actions Taken?: None at this time. MD notified

## 2021-05-08 NOTE — Progress Notes (Addendum)
PROGRESS NOTE    Joshua Morales  SFK:[id number] DOB: Jan 25, 1946 DOA: 05/05/2021 PCP: [facility]    No chief complaint on file.   Brief Narrative:  Patient 75 year old gentleman prior history of agent orange exposure, atrial thrombus, stage IIIb chronic kidney disease, alcoholic dementia, type 2 diabetes, hypertension, microcytic anemia presented to the ED with multiple episodes of emesis and diarrhea.  Patient also noted to be in acute kidney injury.  Patient placed on IV fluids.  C. difficile PCR obtained was positive for C. difficile antigen, negative for toxin, PCR pending.  Patient noted to have ongoing watery stools and subsequently placed empirically on oral vancomycin.   Assessment & Plan:   Principal Problem:   AKI (acute kidney injury) (HCC) Active Problems:   Enteritis, enterotoxigenic E. coli   Diabetes mellitus type 2, uncontrolled (HCC)   HTN (hypertension)   Tobacco abuse   Dementia associated with alcoholism (HCC)   Atrial thrombus   Macrocytic anemia   CRD (chronic renal disease), stage 3b (HCC)   Malnutrition of moderate degree   1 acute kidney injury on chronic kidney disease stage IIIb -Secondary to prerenal azotemia in the setting of GI losses from emesis and diarrhea -Improving and seems to be plateauing currently at 1.89.   -Saline lock IV fluids.   -Supportive care.   2.  Enterotoxigenic E. coli diarrhea illness -Patient presented multiple episodes of emesis, watery stools.  C. difficile positive for antigen, negative for toxin.  C. difficile PCR negative and patient likely colonized with C. difficile however unlikely to cause diarrheal illness that patient presented with.  -GI pathogen panel positive for enterotoxigenic E. coli.  -Patient was started on oral vancomycin with clinical improvement.  -We will continue vancomycin to complete a 3-day course.  -Saline lock IV fluids.  -Supportive care.  3.  Uncontrolled diabetes mellitus  type 2 -Hemoglobin A1c 12.1 (04/05/2021) -CBG 249 this morning.  -Increase Levemir to 14 units daily.  Start meal coverage NovoLog 4 units 3 times daily with meals.  SSI.    4.  Hypertension -Continue atenolol and hydralazine.   -Outpatient follow-up.    5.  Dementia associated with alcoholism -Continue Aricept, Namenda, Seroquel.   6.  Hyperlipidemia -Statin.  7.  History of atrial thrombus -Continue Eliquis.  8.  Macrocytic anemia -Hemoglobin stable at 8.7.   -Follow H&H.   9.  Moderate protein calorie malnutrition -Nutritional supplementation.     DVT prophylaxis: Eliquis Code Status: Full Family Communication: Updated patient.  Updated daughter via telephone.   Disposition:   Status is: Inpatient  Remains inpatient appropriate because:Inpatient level of care appropriate due to severity of illness  Dispo: The patient is from: Home              Anticipated d/c is to: Home              Patient currently is not medically stable to d/c.   Difficult to place patient No       Consultants:  None  Procedures:  None  Antimicrobials:  Oral vancomycin 05/07/2021   Subjective: Per RN IV accidentally pulled out by patient overnight.  Patient denies any chest pain.  No shortness of breath.  No abdominal pain.  Consistency of stool improving with minimal stool output today per patient and RN.  Tolerating current diet.  Overall feeling much better.    Objective: Vitals:   05/07/21 0741 05/07/21 1606 05/07/21 2053 05/08/21 0410  BP: 128/62 (!) 167/66 Marland Kitchen)  160/74 (!) 165/73  Pulse: (!) 52 (!) 59 64 60  Resp: 15 17 18 18   Temp: 98.1 F (36.7 C) 97.7 F (36.5 C) 98.1 F (36.7 C) 98.2 F (36.8 C)  TempSrc: Oral Oral    SpO2: 100% 100% 100% 100%  Weight:      Height:        Intake/Output Summary (Last 24 hours) at 05/08/2021 1650 Last data filed at 05/08/2021 0900 Gross per 24 hour  Intake 840 ml  Output 800 ml  Net 40 ml   Filed Weights   05/05/21 1401   Weight: 56 kg    Examination:  General exam: : NAD Respiratory system: CTA B.  No wheezes, no rhonchi.  Speaking in full sentences.  Normal respiratory effort. Cardiovascular system: Regular rate and rhythm no murmurs rubs or gallops.  No JVD.  No lower extremity edema.  Gastrointestinal system: Abdomen soft, nontender, nondistended, positive bowel sounds.  No rebound.  No guarding. Central nervous system: Alert and oriented. No focal neurological deficits. Extremities: Symmetric 5 x 5 power. Skin: No rashes, lesions or ulcers Psychiatry: Judgement and insight appear normal. Mood & affect appropriate.  Data Reviewed: I have personally reviewed following labs and imaging studies  CBC: Recent Labs  Lab 05/05/21 1418 05/06/21 0414 05/06/21 1048 05/07/21 0553 05/08/21 0512  WBC 9.4 8.9  --  7.3 7.8  NEUTROABS  --   --   --  4.2  --   HGB 10.2* 7.9* 8.7* 8.8* 8.7*  HCT 32.0* 24.3* 27.0* 27.3* 26.5*  MCV 104.9* 105.2*  --  104.6* 104.7*  PLT 233 175  --  177 157    Basic Metabolic Panel: Recent Labs  Lab 05/05/21 1418 05/06/21 0414 05/07/21 0553 05/08/21 0512  NA 134* 134* 138 134*  K 4.8 4.5 4.4 4.9  CL 98 101 101 98  CO2 25 28 30 28   GLUCOSE 439* 338* 173* 331*  BUN 33* 30* 27* 34*  CREATININE 2.26* 2.11* 1.91* 1.89*  CALCIUM 9.1 8.3* 9.2 8.9  MG  --   --  1.8 2.1  PHOS  --  3.9 3.7 3.7    GFR: Estimated Creatinine Clearance: 27.6 mL/min (A) (by C-G formula based on SCr of 1.89 mg/dL (H)).  Liver Function Tests: Recent Labs  Lab 05/05/21 1418 05/06/21 0414 05/07/21 0553 05/08/21 0512  AST 32  --   --   --   ALT 41  --   --   --   ALKPHOS 63  --   --   --   BILITOT 0.7  --   --   --   PROT 7.7  --   --   --   ALBUMIN 4.1 3.0* 3.2* 3.1*    CBG: Recent Labs  Lab 05/07/21 1628 05/07/21 2054 05/08/21 0803 05/08/21 1120 05/08/21 1629  GLUCAP 263* 219* 249* 159* 281*     Recent Results (from the past 240 hour(s))  Resp Panel by RT-PCR (Flu A&B,  Covid) Nasopharyngeal Swab     Status: None   Collection Time: 05/05/21  4:02 PM   Specimen: Nasopharyngeal Swab; Nasopharyngeal(NP) swabs in vial transport medium  Result Value Ref Range Status   SARS Coronavirus 2 by RT PCR NEGATIVE NEGATIVE Final    Comment: (NOTE) SARS-CoV-2 target nucleic acids are NOT DETECTED.  The SARS-CoV-2 RNA is generally detectable in upper respiratory specimens during the acute phase of infection. The lowest concentration of SARS-CoV-2 viral copies this assay can detect is 138  copies/mL. A negative result does not preclude SARS-Cov-2 infection and should not be used as the sole basis for treatment or other patient management decisions. A negative result may occur with  improper specimen collection/handling, submission of specimen other than nasopharyngeal swab, presence of viral mutation(s) within the areas targeted by this assay, and inadequate number of viral copies(<138 copies/mL). A negative result must be combined with clinical observations, patient history, and epidemiological information. The expected result is Negative.  Fact Sheet for Patients:  BloggerCourse.comhttps://www.fda.gov/media/152166/download  Fact Sheet for Healthcare Providers:  SeriousBroker.ithttps://www.fda.gov/media/152162/download  This test is no t yet approved or cleared by the [place] FDA and  has been authorized for detection and/or diagnosis of SARS-CoV-2 by FDA under an Emergency Use Authorization (EUA). This EUA will remain  in effect (meaning this test can be used) for the duration of the COVID-19 declaration under Section 564(b)(1) of the Act, 21 U.S.C.section 360bbb-3(b)(1), unless the authorization is terminated  or revoked sooner.       Influenza A by PCR NEGATIVE NEGATIVE Final   Influenza B by PCR NEGATIVE NEGATIVE Final    Comment: (NOTE) The Xpert Xpress SARS-CoV-2/FLU/RSV plus assay is intended as an aid in the diagnosis of influenza from Nasopharyngeal swab specimens and should  not be used as a sole basis for treatment. Nasal washings and aspirates are unacceptable for Xpert Xpress SARS-CoV-2/FLU/RSV testing.  Fact Sheet for Patients: BloggerCourse.comhttps://www.fda.gov/media/152166/download  Fact Sheet for Healthcare Providers: SeriousBroker.ithttps://www.fda.gov/media/152162/download  This test is not yet approved or cleared by the [place] FDA and has been authorized for detection and/or diagnosis of SARS-CoV-2 by FDA under an Emergency Use Authorization (EUA). This EUA will remain in effect (meaning this test can be used) for the duration of the COVID-19 declaration under Section 564(b)(1) of the Act, 21 U.S.C. section 360bbb-3(b)(1), unless the authorization is terminated or revoked.  Performed at [facility], [address] [place], [place] [address]   Gastrointestinal Panel by PCR , Stool     Status: Abnormal   Collection Time: 05/07/21 10:57 AM   Specimen: Stool  Result Value Ref Range Status   Campylobacter species NOT DETECTED NOT DETECTED Final   Plesimonas shigelloides NOT DETECTED NOT DETECTED Final   Salmonella species NOT DETECTED NOT DETECTED Final   Yersinia enterocolitica NOT DETECTED NOT DETECTED Final   Vibrio species NOT DETECTED NOT DETECTED Final   Vibrio cholerae NOT DETECTED NOT DETECTED Final   Enteroaggregative E coli (EAEC) NOT DETECTED NOT DETECTED Final   Enteropathogenic E coli (EPEC) NOT DETECTED NOT DETECTED Final   Enterotoxigenic E coli (ETEC) DETECTED (A) NOT DETECTED Final    Comment: RESULT CALLED TO, READ BACK BY AND VERIFIED WITH: ANGEL RAY 05/08/21 1633 AMK    Shiga like toxin producing E coli (STEC) NOT DETECTED NOT DETECTED Final   Shigella/Enteroinvasive E coli (EIEC) NOT DETECTED NOT DETECTED Final   Cryptosporidium NOT DETECTED NOT DETECTED Final   Cyclospora cayetanensis NOT DETECTED NOT DETECTED Final   Entamoeba histolytica NOT DETECTED NOT DETECTED Final   Giardia lamblia NOT DETECTED NOT DETECTED Final   Adenovirus F40/41 NOT  DETECTED NOT DETECTED Final   Astrovirus NOT DETECTED NOT DETECTED Final   Norovirus GI/GII NOT DETECTED NOT DETECTED Final   Rotavirus A NOT DETECTED NOT DETECTED Final   Sapovirus (I, II, IV, and V) NOT DETECTED NOT DETECTED Final    Comment: Performed at [facility], [address] [place], [place] [address]  C Difficile Quick Screen w PCR reflex  Status: Abnormal   Collection Time: 05/07/21 10:57 AM   Specimen: Stool  Result Value Ref Range Status   C Diff antigen POSITIVE (A) NEGATIVE Final   C Diff toxin NEGATIVE NEGATIVE Final   C Diff interpretation Results are indeterminate. See PCR results.  Final    Comment: Performed at [facility], [address] [place], [place] [address]  C. Diff by PCR, Reflexed     Status: None   Collection Time: 05/07/21 10:57 AM  Result Value Ref Range Status   Toxigenic C. Difficile by PCR NEGATIVE NEGATIVE Final    Comment: Patient is colonized with non toxigenic C. difficile. May not need treatment unless significant symptoms are present. Performed at [facility], [address] [place], [place] [address]          Radiology Studies: No results found.      Scheduled Meds:  apixaban  2.5 mg Oral BID   atenolol  25 mg Oral Daily   atorvastatin  40 mg Oral Daily   donepezil  5 mg Oral QHS   feeding supplement  237 mL Oral BID BM   folic acid  1 mg Oral Daily   hydrALAZINE  25 mg Oral Q8H   insulin aspart  0-9 Units Subcutaneous TID WC   insulin aspart  2 Units Subcutaneous TID WC   insulin detemir  14 Units Subcutaneous QHS   memantine  5 mg Oral BID   QUEtiapine  25 mg Oral QHS   thiamine  100 mg Oral Daily   vancomycin  125 mg Oral QID   Continuous Infusions:     LOS: 2 days    Time spent: 35 minutes    Ramiro Harvest, MD [facility]   To contact the attending provider between 7A-7P or the covering provider during after hours 7P-7A, please log into the web site [email or web address] and  access using universal [place] password for that web site. If you do not have the password, please call the hospital operator.  05/08/2021, 4:50 PM

## 2021-05-09 DIAGNOSIS — E440 Moderate protein-calorie malnutrition: Secondary | ICD-10-CM

## 2021-05-09 LAB — CBC
HCT: 30.6 % — ABNORMAL LOW (ref 39.0–52.0)
Hemoglobin: 9.8 g/dL — ABNORMAL LOW (ref 13.0–17.0)
MCH: 33.3 pg (ref 26.0–34.0)
MCHC: 32 g/dL (ref 30.0–36.0)
MCV: 104.1 fL — ABNORMAL HIGH (ref 80.0–100.0)
Platelets: 183 10*3/uL (ref 150–400)
RBC: 2.94 MIL/uL — ABNORMAL LOW (ref 4.22–5.81)
RDW: 14 % (ref 11.5–15.5)
WBC: 10.9 10*3/uL — ABNORMAL HIGH (ref 4.0–10.5)
nRBC: 0 % (ref 0.0–0.2)

## 2021-05-09 LAB — RENAL FUNCTION PANEL
Albumin: 3.4 g/dL — ABNORMAL LOW (ref 3.5–5.0)
Anion gap: 6 (ref 5–15)
BUN: 43 mg/dL — ABNORMAL HIGH (ref 8–23)
CO2: 26 mmol/L (ref 22–32)
Calcium: 8.7 mg/dL — ABNORMAL LOW (ref 8.9–10.3)
Chloride: 105 mmol/L (ref 98–111)
Creatinine, Ser: 1.96 mg/dL — ABNORMAL HIGH (ref 0.61–1.24)
GFR, Estimated: 35 mL/min — ABNORMAL LOW (ref >60)
Glucose, Bld: 56 mg/dL — ABNORMAL LOW (ref 70–99)
Phosphorus: 3.9 mg/dL (ref 2.5–4.6)
Potassium: 3.9 mmol/L (ref 3.5–5.1)
Sodium: 137 mmol/L (ref 135–145)

## 2021-05-09 LAB — GLUCOSE, CAPILLARY
Glucose-Capillary: 48 mg/dL — ABNORMAL LOW (ref 70–99)
Glucose-Capillary: 88 mg/dL (ref 70–99)
Glucose-Capillary: 305 mg/dL — ABNORMAL HIGH (ref 70–99)
Glucose-Capillary: 216 mg/dL — ABNORMAL HIGH (ref 70–99)

## 2021-05-09 LAB — MAGNESIUM: Magnesium: 2.2 mg/dL (ref 1.7–2.4)

## 2021-05-09 MED ORDER — INSULIN DETEMIR 100 UNIT/ML ~~LOC~~ SOLN
10.0000 [IU] | Freq: Every day | SUBCUTANEOUS | Status: DC
  Filled 2021-05-09: qty 0.1

## 2021-05-09 MED ORDER — HYDRALAZINE HCL 50 MG PO TABS: 50.0000 mg | ORAL_TABLET | Freq: Three times a day (TID) | ORAL | 1 refills | Status: DC

## 2021-05-09 MED ORDER — HYDRALAZINE HCL 25 MG PO TABS
50.0000 mg | ORAL_TABLET | Freq: Three times a day (TID) | ORAL | Status: DC
  Administered 2021-05-09: 50 mg via ORAL
  Filled 2021-05-09: qty 2

## 2021-05-09 MED ORDER — INSULIN DETEMIR 100 UNIT/ML ~~LOC~~ SOLN
12.0000 [IU] | Freq: Every day | SUBCUTANEOUS | Status: DC
  Filled 2021-05-09: qty 0.12

## 2021-05-09 NOTE — TOC Transition Note (Signed)
Transition of Care [facility]) - CM/SW Discharge Note   Patient Details  Name: Joshua Morales MRN: [id number] Date of Birth: 1946/03/09  [facility] CM/SW Contact:  Barry Brunner, LCSW Phone Number: 05/09/2021, 1:46 PM   Clinical Narrative:    CSW notified of patient's readiness for discharge. CSW notified Kenzie with [facility] agreeable to resume services. TOC signing off.    Final next level of care: Home w Home Health Services Barriers to Discharge: Barriers Resolved   Patient Goals and CMS Choice Patient states their goals for this hospitalization and ongoing recovery are:: Return home with [facility] CMS Medicare.gov Compare Post Acute Care list provided to:: Patient    Discharge Placement                    Patient and family notified of of transfer: 05/09/21  Discharge Plan and Services                            [facility] Agency: [facility] Date [facility] Agency Contacted: 05/09/21 Time HH Agency Contacted: 1345 Representative spoke with at [facility] Agency: [facility]  Social Determinants of Health (SDOH) Interventions     Readmission Risk Interventions No flowsheet data found.

## 2021-05-09 NOTE — Plan of Care (Signed)
  Problem: Acute Rehab PT Goals(only PT should resolve) Goal: Pt Will Go Supine/Side To Sit Flowsheets (Taken 05/09/2021 1055) Pt will go Supine/Side to Sit: Independently Goal: Pt Will Go Sit To Supine/Side Flowsheets (Taken 05/09/2021 1055) Pt will go Sit to Supine/Side: Independently Goal: Patient Will Transfer Sit To/From Stand Flowsheets (Taken 05/09/2021 1056) Patient will transfer sit to/from stand: Independently

## 2021-05-09 NOTE — Evaluation (Signed)
Physical Therapy Evaluation Patient Details Name: Joshua Morales MRN: [id number] DOB: 20-Dec-1945 Today's Date: 05/09/2021   History of Present Illness  Patient 75 year old gentleman prior history of agent orange exposure, atrial thrombus, stage IIIb chronic kidney disease, alcoholic dementia, type 2 diabetes, hypertension, microcytic anemia presented to the ED with multiple episodes of emesis and diarrhea.  Patient also noted to be in acute kidney injury.  Patient placed on IV fluids.  C. difficile PCR obtained was positive for C. difficile antigen, negative for toxin, PCR pending.  Patient noted to have ongoing watery stools and subsequently placed empirically on oral vancomycin with improvement.  Clinical Impression  Pt having slight difficulty with supine to sit and sit to stand but able to complete with increased time.   Noted improved ambulation the longer pt went.     Follow Up Recommendations Home health PT    Equipment Recommendations  None recommended by PT    Recommendations for Other Services       Precautions / Restrictions Precautions Precautions: None Restrictions Weight Bearing Restrictions: No      Mobility  Bed Mobility Overal bed mobility: Independent                  Transfers Overall transfer level: Independent Equipment used: Straight cane                Ambulation/Gait Ambulation/Gait assistance: Modified independent (Device/Increase time) Gait Distance (Feet): 125 Feet Assistive device: Straight cane Gait Pattern/deviations: Step-through pattern;Decreased step length - right              Pertinent Vitals/Pain Pain Assessment: No/denies pain    Home Living Family/patient expects to be discharged to:: Private residence Living Arrangements: Children;Spouse/significant other Available Help at Discharge: Family;Available 24 hours/day Type of Home: House Home Access: Stairs to enter Entrance Stairs-Rails: Right;Left;Can reach  both Entrance Stairs-Number of Steps: 5 Home Layout: One level Home Equipment: Cane - single point      Prior Function Level of Independence: Independent with assistive device(s)         Comments: Tourist information centre manager,  independent in ADLs     Hand Dominance   Dominant Hand: Right    Extremity/Trunk Assessment        Lower Extremity Assessment Lower Extremity Assessment: Overall WFL for tasks assessed       Communication   Communication: No difficulties        Exercises General Exercises - Lower Extremity Long Arc Quad: Both;10 reps;Seated Hip ABduction/ADduction: Both;10 reps;Seated Hip Flexion/Marching: Both;10 reps;Seated Toe Raises: Both;10 reps;Seated Heel Raises: Both;Seated;10 reps Mini-Sqauts: Both;10 reps (sit to stand and sitting tall)   Assessment/Plan    PT Assessment Patient needs continued PT services (Pt was recieving HH prior to admission)  PT Problem List Decreased strength;Decreased balance       PT Treatment Interventions Therapeutic exercise;Gait training    PT Goals (Current goals can be found in the Care Plan section)       Frequency Min 2X/week   Barriers to discharge        Co-evaluation               AM-PAC PT "6 Clicks" Mobility  Outcome Measure Help needed turning from your back to your side while in a flat bed without using bedrails?: None Help needed moving from lying on your back to sitting on the side of a flat bed without using bedrails?: None Help needed moving to and from a bed to a chair (  including a wheelchair)?: None Help needed standing up from a chair using your arms (e.g., wheelchair or bedside chair)?: None Help needed to walk in hospital room?: A Little Help needed climbing 3-5 steps with a railing? : A Lot 6 Click Score: 21    End of Session Equipment Utilized During Treatment: Gait belt Activity Tolerance: Patient tolerated treatment well Patient left: in chair;with call bell/phone within  reach;with chair alarm set Nurse Communication: Mobility status PT Visit Diagnosis: Unsteadiness on feet (R26.81);Muscle weakness (generalized) (M62.81)    Time: 2841-3244 PT Time Calculation (min) (ACUTE ONLY): 38 min   Charges:   PT Evaluation $PT Eval Low Complexity: 1 Low PT Treatments $Therapeutic Exercise: 8-22 mins      Virgina Organ, PT CLT [phone]  05/09/2021, 10:57 AM

## 2021-05-09 NOTE — Progress Notes (Signed)
Pt has discharge orders. Discharge teaching given to pt and daughter who is bedside. No further questions at this time.

## 2021-05-09 NOTE — Discharge Summary (Signed)
Physician Discharge Summary  Joshua Morales MVE:[id number] DOB: [birth year] DOA: 05/05/2021  PCP: [facility]  Admit date: 05/05/2021 Discharge date: 05/09/2021  Time spent: 50 minutes  Recommendations for Outpatient Follow-up:  Follow-up with [facility] in 2 weeks.  On follow-up patient will need a basic metabolic profile done to follow-up on electrolytes and renal function.  Patient's diarrhea and this will need to be followed up upon.  Patient blood pressure need to be reassessed.   Discharge Diagnoses:  Principal Problem:   AKI (acute kidney injury) (HCC) Active Problems:   Enteritis, enterotoxigenic E. coli   Diabetes mellitus type 2, uncontrolled (HCC)   HTN (hypertension)   Tobacco abuse   Dementia associated with alcoholism (HCC)   Atrial thrombus   Macrocytic anemia   CRD (chronic renal disease), stage 3b (HCC)   Malnutrition of moderate degree   Discharge Condition: Stable and improved.  Diet recommendation: Heart healthy/dysphagia 3 diet with thin liquids.  Filed Weights   05/05/21 1401  Weight: 56 kg    History of present illness:  HPI per Dr. Hurman Horn is a 75 y.o. male with medical history significant of agent orange exposure, atrial thrombus, stage IIIb CRD, alcoholic dementia, type 2 diabetes, hypertension, microcytic anemia who was brought to the emergency department due to having multiple episodes of emesis and diarrhea in the past 2 days.  History is taken from the patient's daughter as he is unable to elaborate and was only oriented x1.  He is able to answer simple questions and denied having headache, abdominal, back or chest pain.  He did not have any specific complaints at the time of interview and examination.   ED Course: Initial vital signs were temperature 98 F, pulse 68, respirations 17, BP 150/75 mmHg O2 sat 98% on room air.  The patient received 1000 mL LR bolus, 1000 mL NS bolus and Zofran disintegrating  tablet.   Lab work: His urinalysis showed glucosuria of more than 500 and proteinuria of 100 mg/dL.  There was small hemoglobinuria and rare bacteria.  CBC showed a white count 9.4, hemoglobin 10.2 g/dL and platelets 283.  CMP showed a glucose of 439, BUN of 33 and creatinine of 2.26 mg/dL.  The rest of the CMP values were normal.  Baseline creatinine level is around 1.80 mg/dL  Hospital Course:  1 acute kidney injury on chronic kidney disease stage IIIb -Secondary to prerenal azotemia in the setting of GI losses from emesis and diarrhea -Patient hydrated IV fluids with resolution of acute kidney injury by day of discharge. -Creatinine was down to 1.96 by day of discharge. -Outpatient follow-up with PCP.    2.  Enterotoxigenic E. coli diarrhea illness -Patient presented multiple episodes of emesis, watery stools.  C. difficile positive for antigen, negative for toxin.  C. difficile PCR negative and patient likely colonized with C. difficile however unlikely to cause diarrheal illness that patient presented with.  -GI pathogen panel positive for enterotoxigenic E. coli.  -Patient was started on oral vancomycin with clinical improvement.  -Patient also placed on IV fluids and supportive care.   -Patient improved clinically with resolution of diarrhea by day of discharge.   -Patient was discharged home in stable and improved condition.   -No further antibiotics necessary on discharge.   3.  Uncontrolled diabetes mellitus type 2 -Hemoglobin A1c 12.1 (04/05/2021) -Patient initially placed on home regimen Levemir and dose uptitrated for better blood glucose control.   -Patient noted  to have a low blood glucose level of 48 during the hospitalization however remained asymptomatic.  Patient was given orange juice and was awaiting his breakfast tray at that time.  Repeat blood glucose levels had improved.   -Patient will be discharged home on home regimen of Levemir 8 units daily, new coverage NovoLog.    -Outpatient follow-up with PCP.   4.  Hypertension -Patient initially placed on home regimen atenolol.   -Hydralazine was added to patient's regimen for better blood pressure control.   -Outpatient follow-up with PCP.   5.  Dementia associated with alcoholism -Patient maintained on home regimen Aricept, Namenda, Seroquel.   6.  Hyperlipidemia -Patient maintained on statin.  7.  History of atrial thrombus -Patient maintained on home regimen Eliquis.  8.  Macrocytic anemia -Hemoglobin stabilized at 9.8 by day of discharge.   -Outpatient follow-up.    9.  Moderate protein calorie malnutrition -Patient maintained on nutritional supplementation.       Procedures: None  Consultations: None  Discharge Exam: Vitals:   05/09/21 0626 05/09/21 1317  BP: (!) 184/80 (!) 151/79  Pulse: 70 75  Resp: 18 19  Temp: 98.1 F (36.7 C) 98.3 F (36.8 C)  SpO2: 100% 100%    General: NAD Cardiovascular: Regular rate rhythm no murmurs rubs or gallops.  No JVD.  No lower extremity edema. Respiratory: Lungs clear to auscultation bilaterally.  No wheezes no crackles no rhonchi.  Normal respiratory effort.  Discharge Instructions   Discharge Instructions     Diet - low sodium heart healthy   Complete by: As directed    Dysphagia 3 diet with thin liquids   Increase activity slowly   Complete by: As directed       Allergies as of 05/09/2021   No Known Allergies      Medication List     STOP taking these medications    insulin glargine 100 unit/mL Sopn Commonly known as: LANTUS       TAKE these medications    ACIDOPHILUS LACTOBACILLUS PO Take 2 capsules by mouth in the morning and at bedtime.   apixaban 2.5 MG Tabs tablet Commonly known as: ELIQUIS Take 1 tablet (2.5 mg total) by mouth 2 (two) times daily.   atenolol 25 MG tablet Commonly known as: TENORMIN Take 1 tablet (25 mg total) by mouth daily.   atorvastatin 40 MG tablet Commonly known as: LIPITOR Take  40 mg by mouth daily.   D3-1000 25 MCG (1000 UT) tablet Generic drug: Cholecalciferol Take 2,000 Units by mouth daily.   donepezil 5 MG tablet Commonly known as: ARICEPT Take 1 tablet (5 mg total) by mouth at bedtime.   folic acid 1 MG tablet Commonly known as: FOLVITE Take 1 tablet (1 mg total) by mouth daily.   hydrALAZINE 50 MG tablet Commonly known as: APRESOLINE Take 1 tablet (50 mg total) by mouth every 8 (eight) hours.   hydrOXYzine 10 MG tablet Commonly known as: ATARAX/VISTARIL Take 10 mg by mouth 2 (two) times daily as needed for itching.   insulin aspart 100 UNIT/ML FlexPen Commonly known as: NOVOLOG Inject 2 Units into the skin 3 (three) times daily with meals. IF EATS 50% OR MORE OF MEAL. What changed: how much to take   insulin detemir 100 UNIT/ML FlexPen Commonly known as: LEVEMIR Inject 8 Units into the skin daily.   Insulin Pen Needle 31G X 5 MM Misc 1 Device by Does not apply route as directed.   memantine 5 MG  tablet Commonly known as: NAMENDA Take 1 tablet (5 mg total) by mouth 2 (two) times daily.   mirtazapine 15 MG tablet Commonly known as: REMERON TAKE ONE TABLET BY MOUTH AT BEDTIME TO STIMULATE APPETITE   multivitamin with minerals Tabs tablet Take 1 tablet by mouth daily.   polyethylene glycol 17 g packet Commonly known as: MIRALAX / GLYCOLAX Take 17 g by mouth daily as needed for mild constipation.   QUEtiapine 25 MG tablet Commonly known as: SEROQUEL Take 1 tablet (25 mg total) by mouth at bedtime.   thiamine 100 MG tablet Take 1 tablet (100 mg total) by mouth daily.       No Known Allergies  Follow-up Information     [facility]. Schedule an appointment as soon as possible for a visit in 2 week(s).   Specialty: General Practice Contact information: [address] [place] [address] [phone]                  The results of significant diagnostics from this hospitalization (including imaging,  microbiology, ancillary and laboratory) are listed below for reference.    Significant Diagnostic Studies: No results found.  Microbiology: Recent Results (from the past 240 hour(s))  Resp Panel by RT-PCR (Flu A&B, Covid) Nasopharyngeal Swab     Status: None   Collection Time: 05/05/21  4:02 PM   Specimen: Nasopharyngeal Swab; Nasopharyngeal(NP) swabs in vial transport medium  Result Value Ref Range Status   SARS Coronavirus 2 by RT PCR NEGATIVE NEGATIVE Final    Comment: (NOTE) SARS-CoV-2 target nucleic acids are NOT DETECTED.  The SARS-CoV-2 RNA is generally detectable in upper respiratory specimens during the acute phase of infection. The lowest concentration of SARS-CoV-2 viral copies this assay can detect is 138 copies/mL. A negative result does not preclude SARS-Cov-2 infection and should not be used as the sole basis for treatment or other patient management decisions. A negative result may occur with  improper specimen collection/handling, submission of specimen other than nasopharyngeal swab, presence of viral mutation(s) within the areas targeted by this assay, and inadequate number of viral copies(<138 copies/mL). A negative result must be combined with clinical observations, patient history, and epidemiological information. The expected result is Negative.  Fact Sheet for Patients:  BloggerCourse.com  Fact Sheet for Healthcare Providers:  SeriousBroker.it  This test is no t yet approved or cleared by the [place] FDA and  has been authorized for detection and/or diagnosis of SARS-CoV-2 by FDA under an Emergency Use Authorization (EUA). This EUA will remain  in effect (meaning this test can be used) for the duration of the COVID-19 declaration under Section 564(b)(1) of the Act, 21 U.S.C.section 360bbb-3(b)(1), unless the authorization is terminated  or revoked sooner.       Influenza A by PCR NEGATIVE  NEGATIVE Final   Influenza B by PCR NEGATIVE NEGATIVE Final    Comment: (NOTE) The Xpert Xpress SARS-CoV-2/FLU/RSV plus assay is intended as an aid in the diagnosis of influenza from Nasopharyngeal swab specimens and should not be used as a sole basis for treatment. Nasal washings and aspirates are unacceptable for Xpert Xpress SARS-CoV-2/FLU/RSV testing.  Fact Sheet for Patients: BloggerCourse.com  Fact Sheet for Healthcare Providers: SeriousBroker.it  This test is not yet approved or cleared by the [place] FDA and has been authorized for detection and/or diagnosis of SARS-CoV-2 by FDA under an Emergency Use Authorization (EUA). This EUA will remain in effect (meaning this test can be used) for the  duration of the COVID-19 declaration under Section 564(b)(1) of the Act, 21 U.S.C. section 360bbb-3(b)(1), unless the authorization is terminated or revoked.  Performed at [facility], [address] [place], [place] [address]   Gastrointestinal Panel by PCR , Stool     Status: Abnormal   Collection Time: 05/07/21 10:57 AM   Specimen: Stool  Result Value Ref Range Status   Campylobacter species NOT DETECTED NOT DETECTED Final   Plesimonas shigelloides NOT DETECTED NOT DETECTED Final   Salmonella species NOT DETECTED NOT DETECTED Final   Yersinia enterocolitica NOT DETECTED NOT DETECTED Final   Vibrio species NOT DETECTED NOT DETECTED Final   Vibrio cholerae NOT DETECTED NOT DETECTED Final   Enteroaggregative E coli (EAEC) NOT DETECTED NOT DETECTED Final   Enteropathogenic E coli (EPEC) NOT DETECTED NOT DETECTED Final   Enterotoxigenic E coli (ETEC) DETECTED (A) NOT DETECTED Final    Comment: RESULT CALLED TO, READ BACK BY AND VERIFIED WITH: ANGEL RAY 05/08/21 1633 AMK    Shiga like toxin producing E coli (STEC) NOT DETECTED NOT DETECTED Final   Shigella/Enteroinvasive E coli (EIEC) NOT DETECTED NOT DETECTED Final    Cryptosporidium NOT DETECTED NOT DETECTED Final   Cyclospora cayetanensis NOT DETECTED NOT DETECTED Final   Entamoeba histolytica NOT DETECTED NOT DETECTED Final   Giardia lamblia NOT DETECTED NOT DETECTED Final   Adenovirus F40/41 NOT DETECTED NOT DETECTED Final   Astrovirus NOT DETECTED NOT DETECTED Final   Norovirus GI/GII NOT DETECTED NOT DETECTED Final   Rotavirus A NOT DETECTED NOT DETECTED Final   Sapovirus (I, II, IV, and V) NOT DETECTED NOT DETECTED Final    Comment: Performed at [facility], [address] [place], [place] [address]  C Difficile Quick Screen w PCR reflex     Status: Abnormal   Collection Time: 05/07/21 10:57 AM   Specimen: Stool  Result Value Ref Range Status   C Diff antigen POSITIVE (A) NEGATIVE Final   C Diff toxin NEGATIVE NEGATIVE Final   C Diff interpretation Results are indeterminate. See PCR results.  Final    Comment: Performed at [facility], [address] [place], [place] [address]  C. Diff by PCR, Reflexed     Status: None   Collection Time: 05/07/21 10:57 AM  Result Value Ref Range Status   Toxigenic C. Difficile by PCR NEGATIVE NEGATIVE Final    Comment: Patient is colonized with non toxigenic C. difficile. May not need treatment unless significant symptoms are present. Performed at [facility], [address] [place], [place] [address]      Labs: Basic Metabolic Panel: Recent Labs  Lab 05/05/21 1418 05/06/21 0414 05/07/21 0553 05/08/21 0512 05/09/21 0649  NA 134* 134* 138 134* 137  K 4.8 4.5 4.4 4.9 3.9  CL 98 101 101 98 105  CO2 25 28 30 28 26   GLUCOSE 439* 338* 173* 331* 56*  BUN 33* 30* 27* 34* 43*  CREATININE 2.26* 2.11* 1.91* 1.89* 1.96*  CALCIUM 9.1 8.3* 9.2 8.9 8.7*  MG  --   --  1.8 2.1 2.2  PHOS  --  3.9 3.7 3.7 3.9   Liver Function Tests: Recent Labs  Lab 05/05/21 1418 05/06/21 0414 05/07/21 0553 05/08/21 0512 05/09/21 0649  AST 32  --   --   --   --   ALT 41  --   --   --   --   ALKPHOS  63  --   --   --   --  BILITOT 0.7  --   --   --   --   PROT 7.7  --   --   --   --   ALBUMIN 4.1 3.0* 3.2* 3.1* 3.4*   Recent Labs  Lab 05/05/21 1418  LIPASE 56*   No results for input(s): AMMONIA in the last 168 hours. CBC: Recent Labs  Lab 05/05/21 1418 05/06/21 0414 05/06/21 1048 05/07/21 0553 05/08/21 0512 05/09/21 0649  WBC 9.4 8.9  --  7.3 7.8 10.9*  NEUTROABS  --   --   --  4.2  --   --   HGB 10.2* 7.9* 8.7* 8.8* 8.7* 9.8*  HCT 32.0* 24.3* 27.0* 27.3* 26.5* 30.6*  MCV 104.9* 105.2*  --  104.6* 104.7* 104.1*  PLT 233 175  --  177 157 183   Cardiac Enzymes: No results for input(s): CKTOTAL, CKMB, CKMBINDEX, TROPONINI in the last 168 hours. BNP: BNP (last 3 results) No results for input(s): BNP in the last 8760 hours.  ProBNP (last 3 results) No results for input(s): PROBNP in the last 8760 hours.  CBG: Recent Labs  Lab 05/08/21 2029 05/09/21 0715 05/09/21 0747 05/09/21 1059 05/09/21 1558  GLUCAP 231* 48* 88 305* 216*       Signed:  Ramiro Harvest MD.  [facility] 05/09/2021, 4:49 PM

## 2021-05-24 ENCOUNTER — Inpatient Hospital Stay (HOSPITAL_COMMUNITY)
Admission: EM | Admit: 2021-05-24 | Discharge: 2021-05-27 | DRG: 065 | Disposition: A | Discharge status: Home Health | Payer: No Typology Code available for payment source | Attending: Family Medicine | Admitting: Family Medicine

## 2021-05-24 ENCOUNTER — Other Ambulatory Visit: Payer: Self-pay

## 2021-05-24 ENCOUNTER — Emergency Department (HOSPITAL_COMMUNITY): Payer: No Typology Code available for payment source

## 2021-05-24 ENCOUNTER — Encounter (HOSPITAL_COMMUNITY): Payer: Self-pay

## 2021-05-24 DIAGNOSIS — D539 Nutritional anemia, unspecified: Secondary | ICD-10-CM | POA: Diagnosis present

## 2021-05-24 DIAGNOSIS — I6340 Cerebral infarction due to embolism of unspecified cerebral artery: Secondary | ICD-10-CM | POA: Diagnosis not present

## 2021-05-24 DIAGNOSIS — I513 Intracardiac thrombosis, not elsewhere classified: Secondary | ICD-10-CM | POA: Diagnosis present

## 2021-05-24 DIAGNOSIS — Z902 Acquired absence of lung [part of]: Secondary | ICD-10-CM

## 2021-05-24 DIAGNOSIS — N179 Acute kidney failure, unspecified: Secondary | ICD-10-CM | POA: Diagnosis present

## 2021-05-24 DIAGNOSIS — N1832 Chronic kidney disease, stage 3b: Secondary | ICD-10-CM | POA: Diagnosis not present

## 2021-05-24 DIAGNOSIS — E1165 Type 2 diabetes mellitus with hyperglycemia: Secondary | ICD-10-CM | POA: Diagnosis present

## 2021-05-24 DIAGNOSIS — G8321 Monoplegia of upper limb affecting right dominant side: Secondary | ICD-10-CM | POA: Diagnosis present

## 2021-05-24 DIAGNOSIS — E1122 Type 2 diabetes mellitus with diabetic chronic kidney disease: Secondary | ICD-10-CM | POA: Diagnosis present

## 2021-05-24 DIAGNOSIS — F1027 Alcohol dependence with alcohol-induced persisting dementia: Secondary | ICD-10-CM | POA: Diagnosis present

## 2021-05-24 DIAGNOSIS — I129 Hypertensive chronic kidney disease with stage 1 through stage 4 chronic kidney disease, or unspecified chronic kidney disease: Secondary | ICD-10-CM | POA: Diagnosis present

## 2021-05-24 DIAGNOSIS — Z7901 Long term (current) use of anticoagulants: Secondary | ICD-10-CM

## 2021-05-24 DIAGNOSIS — F17210 Nicotine dependence, cigarettes, uncomplicated: Secondary | ICD-10-CM | POA: Diagnosis present

## 2021-05-24 DIAGNOSIS — R1310 Dysphagia, unspecified: Secondary | ICD-10-CM | POA: Diagnosis present

## 2021-05-24 DIAGNOSIS — Z681 Body mass index (BMI) 19 or less, adult: Secondary | ICD-10-CM

## 2021-05-24 DIAGNOSIS — Z79899 Other long term (current) drug therapy: Secondary | ICD-10-CM

## 2021-05-24 DIAGNOSIS — R531 Weakness: Secondary | ICD-10-CM | POA: Diagnosis not present

## 2021-05-24 DIAGNOSIS — N183 Chronic kidney disease, stage 3 unspecified: Secondary | ICD-10-CM | POA: Diagnosis present

## 2021-05-24 DIAGNOSIS — Z8673 Personal history of transient ischemic attack (TIA), and cerebral infarction without residual deficits: Secondary | ICD-10-CM

## 2021-05-24 DIAGNOSIS — E440 Moderate protein-calorie malnutrition: Secondary | ICD-10-CM | POA: Diagnosis present

## 2021-05-24 DIAGNOSIS — I639 Cerebral infarction, unspecified: Secondary | ICD-10-CM | POA: Diagnosis present

## 2021-05-24 DIAGNOSIS — F0150 Vascular dementia without behavioral disturbance: Secondary | ICD-10-CM | POA: Diagnosis present

## 2021-05-24 DIAGNOSIS — E785 Hyperlipidemia, unspecified: Secondary | ICD-10-CM | POA: Diagnosis present

## 2021-05-24 DIAGNOSIS — IMO0002 Reserved for concepts with insufficient information to code with codable children: Secondary | ICD-10-CM | POA: Diagnosis present

## 2021-05-24 DIAGNOSIS — Z794 Long term (current) use of insulin: Secondary | ICD-10-CM

## 2021-05-24 DIAGNOSIS — E11649 Type 2 diabetes mellitus with hypoglycemia without coma: Secondary | ICD-10-CM

## 2021-05-24 LAB — COMPREHENSIVE METABOLIC PANEL
ALT: 31 U/L (ref 0–44)
AST: 26 U/L (ref 15–41)
Albumin: 3.9 g/dL (ref 3.5–5.0)
Alkaline Phosphatase: 53 U/L (ref 38–126)
Anion gap: 12 (ref 5–15)
BUN: 36 mg/dL — ABNORMAL HIGH (ref 8–23)
CO2: 19 mmol/L — ABNORMAL LOW (ref 22–32)
Calcium: 9.6 mg/dL (ref 8.9–10.3)
Chloride: 109 mmol/L (ref 98–111)
Creatinine, Ser: 2.41 mg/dL — ABNORMAL HIGH (ref 0.61–1.24)
GFR, Estimated: 28 mL/min — ABNORMAL LOW (ref >60)
Glucose, Bld: 53 mg/dL — ABNORMAL LOW (ref 70–99)
Potassium: 4 mmol/L (ref 3.5–5.1)
Sodium: 140 mmol/L (ref 135–145)
Total Bilirubin: 0.8 mg/dL (ref 0.3–1.2)
Total Protein: 7.5 g/dL (ref 6.5–8.1)

## 2021-05-24 LAB — RAPID URINE DRUG SCREEN, HOSP PERFORMED
Amphetamines: NOT DETECTED
Barbiturates: NOT DETECTED
Benzodiazepines: NOT DETECTED
Cocaine: NOT DETECTED
Opiates: NOT DETECTED
Tetrahydrocannabinol: NOT DETECTED

## 2021-05-24 LAB — URINALYSIS, ROUTINE W REFLEX MICROSCOPIC
Bacteria, UA: NONE SEEN
Bilirubin Urine: NEGATIVE
Glucose, UA: 50 mg/dL — AB
Ketones, ur: NEGATIVE mg/dL
Leukocytes,Ua: NEGATIVE
Nitrite: NEGATIVE
Protein, ur: 100 mg/dL — AB
Specific Gravity, Urine: 1.013 (ref 1.005–1.030)
pH: 5 (ref 5.0–8.0)

## 2021-05-24 LAB — CBG MONITORING, ED
Glucose-Capillary: 62 mg/dL — ABNORMAL LOW (ref 70–99)
Glucose-Capillary: 108 mg/dL — ABNORMAL HIGH (ref 70–99)

## 2021-05-24 LAB — CBC WITH DIFFERENTIAL/PLATELET
Abs Immature Granulocytes: 0.04 10*3/uL (ref 0.00–0.07)
Basophils Absolute: 0 10*3/uL (ref 0.0–0.1)
Basophils Relative: 0 %
Eosinophils Absolute: 0.2 10*3/uL (ref 0.0–0.5)
Eosinophils Relative: 2 %
HCT: 32.4 % — ABNORMAL LOW (ref 39.0–52.0)
Hemoglobin: 10.4 g/dL — ABNORMAL LOW (ref 13.0–17.0)
Immature Granulocytes: 0 %
Lymphocytes Relative: 37 %
Lymphs Abs: 3.9 10*3/uL (ref 0.7–4.0)
MCH: 33.2 pg (ref 26.0–34.0)
MCHC: 32.1 g/dL (ref 30.0–36.0)
MCV: 103.5 fL — ABNORMAL HIGH (ref 80.0–100.0)
Monocytes Absolute: 0.9 10*3/uL (ref 0.1–1.0)
Monocytes Relative: 8 %
Neutro Abs: 5.6 10*3/uL (ref 1.7–7.7)
Neutrophils Relative %: 53 %
Platelets: 260 10*3/uL (ref 150–400)
RBC: 3.13 MIL/uL — ABNORMAL LOW (ref 4.22–5.81)
RDW: 14.3 % (ref 11.5–15.5)
WBC: 10.6 10*3/uL — ABNORMAL HIGH (ref 4.0–10.5)
nRBC: 0 % (ref 0.0–0.2)

## 2021-05-24 LAB — GLUCOSE, CAPILLARY
Glucose-Capillary: 131 mg/dL — ABNORMAL HIGH (ref 70–99)
Glucose-Capillary: 86 mg/dL (ref 70–99)

## 2021-05-24 LAB — ETHANOL: Alcohol, Ethyl (B): <10 mg/dL (ref <10)

## 2021-05-24 MED ORDER — POLYETHYLENE GLYCOL 3350 17 G PO PACK
17.0000 g | PACK | Freq: Every day | ORAL | Status: DC | PRN
Start: 2021-05-24 — End: 2021-05-27

## 2021-05-24 MED ORDER — HYDRALAZINE HCL 25 MG PO TABS
50.0000 mg | ORAL_TABLET | Freq: Three times a day (TID) | ORAL | Status: DC
  Administered 2021-05-24 – 2021-05-25 (×2): 50 mg via ORAL
  Filled 2021-05-24 (×2): qty 2

## 2021-05-24 MED ORDER — HYDRALAZINE HCL 20 MG/ML IJ SOLN
10.0000 mg | INTRAMUSCULAR | Status: DC | PRN
Start: 2021-05-24 — End: 2021-05-27

## 2021-05-24 MED ORDER — SODIUM CHLORIDE 0.9 % IV SOLN: INTRAVENOUS | Status: AC

## 2021-05-24 MED ORDER — ONDANSETRON HCL 4 MG/2ML IJ SOLN: 4.0000 mg | Freq: Four times a day (QID) | INTRAMUSCULAR | Status: DC | PRN

## 2021-05-24 MED ORDER — DEXTROSE 50 % IV SOLN
25.0000 mL | Freq: Once | INTRAVENOUS | Status: AC
  Administered 2021-05-24: 25 mL via INTRAVENOUS
  Filled 2021-05-24: qty 50

## 2021-05-24 MED ORDER — INSULIN ASPART 100 UNIT/ML IJ SOLN
0.0000 [IU] | INTRAMUSCULAR | Status: DC
  Administered 2021-05-25: 5 [IU] via SUBCUTANEOUS
  Administered 2021-05-25: 2 [IU] via SUBCUTANEOUS

## 2021-05-24 MED ORDER — ACETAMINOPHEN 325 MG PO TABS: 650.0000 mg | ORAL_TABLET | Freq: Four times a day (QID) | ORAL | Status: DC | PRN

## 2021-05-24 MED ORDER — APIXABAN 2.5 MG PO TABS
2.5000 mg | ORAL_TABLET | Freq: Two times a day (BID) | ORAL | Status: DC
  Administered 2021-05-24 – 2021-05-25 (×2): 2.5 mg via ORAL
  Filled 2021-05-24 (×2): qty 1

## 2021-05-24 MED ORDER — ONDANSETRON HCL 4 MG PO TABS: 4.0000 mg | ORAL_TABLET | Freq: Four times a day (QID) | ORAL | Status: DC | PRN

## 2021-05-24 MED ORDER — LORAZEPAM 2 MG/ML IJ SOLN
0.5000 mg | Freq: Once | INTRAMUSCULAR | Status: AC
  Administered 2021-05-25: 0.5 mg via INTRAVENOUS
  Filled 2021-05-24: qty 1

## 2021-05-24 MED ORDER — ATENOLOL 25 MG PO TABS
25.0000 mg | ORAL_TABLET | Freq: Every day | ORAL | Status: DC
  Administered 2021-05-24 – 2021-05-27 (×4): 25 mg via ORAL
  Filled 2021-05-24 (×4): qty 1

## 2021-05-24 MED ORDER — ACETAMINOPHEN 650 MG RE SUPP: 650.0000 mg | Freq: Four times a day (QID) | RECTAL | Status: DC | PRN

## 2021-05-24 NOTE — ED Triage Notes (Signed)
Patient brought in by [facility] for patient acting sluggish.  Family states that the patient is slow to move and respond.  EMS states that the patient responds appropriately, also takes Aricept and Seroquel.  Negative on stroke scale for EMS.  Vitals: CBG: 162 98.3 F 150/80 BP 63 HR   EMS also states patient was just here 1 week ago for AKI and E.Coli.

## 2021-05-24 NOTE — Plan of Care (Signed)

## 2021-05-24 NOTE — ED Provider Notes (Signed)
[facility] EMERGENCY DEPARTMENT Provider Note  CSN: [id number] Arrival date & time: 05/24/21 1158    History Chief Complaint  Patient presents with   Weakness    Joshua Morales is a 75 y.o. male with history of dementia, CKD, atrial thrombus and recent stroke on Eliquis, brought by daughter for evaluation of R sided weakness. She noticed he was having trouble swallowing his pills last night but he was able to get up and eat something around 4am this morning. Around 6am he was sitting in the kitchen and having trouble controlling his R hand, spilling a drink he had in his hand and leaning over to the right in his chair. No falls or injuries. She put him back to bed but a few hours later he was still having trouble using that hand. He denies any complaints but his history is limited by dementia. Level 5 caveat applies.    Past Medical History:  Diagnosis Date   Agent orange exposure    Atrial thrombus 05/05/2021   CRD (chronic renal disease), stage 3b (HCC) 05/05/2021   Dementia (HCC)    Diabetes mellitus without complication (HCC)    Hypertension    Macrocytic anemia 05/05/2021    Past Surgical History:  Procedure Laterality Date   BUBBLE STUDY  04/07/2021   Procedure: BUBBLE STUDY;  Surgeon: Jonelle Sidle, MD;  Location: [facility];  Service: Cardiovascular;;  with TEE   LUNG REMOVAL, PARTIAL     TEE WITHOUT CARDIOVERSION N/A 04/07/2021   Procedure: TRANSESOPHAGEAL ECHOCARDIOGRAM (TEE);  Surgeon: Jonelle Sidle, MD;  Location: [facility];  Service: Cardiovascular;  Laterality: N/A;   TOE AMPUTATION      Family History  Problem Relation Age of Onset   Congestive Heart Failure Mother    Diabetes Mellitus I Mother    Dementia Father    Throat cancer Brother     Social History   Tobacco Use   Smoking status: Every Day    Types: Cigarettes  Substance Use Topics   Alcohol use: Yes    Comment: occ   Drug use: Never     Home Medications Prior to Admission medications    Medication Sig Start Date End Date Taking? Authorizing Provider  ACIDOPHILUS LACTOBACILLUS PO Take 2 capsules by mouth in the morning and at bedtime.    [provider]  apixaban (ELIQUIS) 2.5 MG TABS tablet Take 1 tablet (2.5 mg total) by mouth 2 (two) times daily. 04/08/21   Johnson, Clanford L, MD  atenolol (TENORMIN) 25 MG tablet Take 1 tablet (25 mg total) by mouth daily. 04/08/21   Johnson, Clanford L, MD  atorvastatin (LIPITOR) 40 MG tablet Take 40 mg by mouth daily.    [provider]  Cholecalciferol (D3-1000) 25 MCG (1000 UT) tablet Take 2,000 Units by mouth daily.    [provider]  donepezil (ARICEPT) 5 MG tablet Take 1 tablet (5 mg total) by mouth at bedtime. 04/08/21   Johnson, Clanford L, MD  folic acid (FOLVITE) 1 MG tablet Take 1 tablet (1 mg total) by mouth daily. 04/08/21   Johnson, Clanford L, MD  hydrALAZINE (APRESOLINE) 50 MG tablet Take 1 tablet (50 mg total) by mouth every 8 (eight) hours. 05/09/21   Rodolph Bong, MD  hydrOXYzine (ATARAX/VISTARIL) 10 MG tablet Take 10 mg by mouth 2 (two) times daily as needed for itching.    [provider]  insulin aspart (NOVOLOG) 100 UNIT/ML FlexPen Inject 2 Units into the skin  3 (three) times daily with meals. IF EATS 50% OR MORE OF MEAL. Patient taking differently: Inject 4 Units into the skin 3 (three) times daily with meals. IF EATS 50% OR MORE OF MEAL. 04/08/21   Johnson, Clanford L, MD  insulin detemir (LEVEMIR) 100 UNIT/ML FlexPen Inject 8 Units into the skin daily.    [provider]  Insulin Pen Needle 31G X 5 MM MISC 1 Device by Does not apply route as directed. 04/08/21   Johnson, Clanford L, MD  memantine (NAMENDA) 5 MG tablet Take 1 tablet (5 mg total) by mouth 2 (two) times daily. 04/08/21   Johnson, Clanford L, MD  mirtazapine (REMERON) 15 MG tablet TAKE ONE TABLET BY MOUTH AT BEDTIME TO STIMULATE APPETITE 08/07/20   [provider]  Multiple Vitamin (MULTIVITAMIN WITH MINERALS)  TABS tablet Take 1 tablet by mouth daily. 04/08/21   Johnson, Clanford L, MD  polyethylene glycol (MIRALAX / GLYCOLAX) 17 g packet Take 17 g by mouth daily as needed for mild constipation. Patient not taking: No sig reported 04/08/21   Standley Dakins L, MD  QUEtiapine (SEROQUEL) 25 MG tablet Take 1 tablet (25 mg total) by mouth at bedtime. 04/08/21   Johnson, Clanford L, MD  thiamine 100 MG tablet Take 1 tablet (100 mg total) by mouth daily. 04/08/21   Cleora Fleet, MD     Allergies    Patient has no known allergies.   Review of Systems   Review of Systems Unable to assess due to mental status.    Physical Exam BP (!) 189/91   Pulse (!) 57   Temp 98.2 F (36.8 C) (Oral)   Resp 13   Ht 6\' 1"  (1.854 m)   Wt 55.8 kg   SpO2 100%   BMI 16.23 kg/m   Physical Exam Vitals and nursing note reviewed.  Constitutional:      Appearance: Normal appearance.  HENT:     Head: Normocephalic and atraumatic.     Nose: Nose normal.     Mouth/Throat:     Mouth: Mucous membranes are moist.  Eyes:     Extraocular Movements: Extraocular movements intact.     Conjunctiva/sclera: Conjunctivae normal.  Cardiovascular:     Rate and Rhythm: Normal rate.  Pulmonary:     Effort: Pulmonary effort is normal.     Breath sounds: Normal breath sounds.  Abdominal:     General: Abdomen is flat.     Palpations: Abdomen is soft.     Tenderness: There is no abdominal tenderness.  Musculoskeletal:        General: No swelling. Normal range of motion.     Cervical back: Neck supple.  Skin:    General: Skin is warm and dry.  Neurological:     Mental Status: He is alert. He is disoriented.     Cranial Nerves: No cranial nerve deficit.     Motor: Weakness (mild decrease in grip strength of R hand compared to L. No other focal deficits) present.     Coordination: Coordination abnormal (finger-to-nose is slow on the R but no past pointing).  Psychiatric:        Mood and Affect: Mood normal.     ED  Results / Procedures / Treatments   Labs (all labs ordered are listed, but only abnormal results are displayed) Labs Reviewed  COMPREHENSIVE METABOLIC PANEL - Abnormal; Notable for the following components:      Result Value   CO2 19 (*)  Glucose, Bld 53 (*)    BUN 36 (*)    Creatinine, Ser 2.41 (*)    GFR, Estimated 28 (*)    All other components within normal limits  CBC WITH DIFFERENTIAL/PLATELET - Abnormal; Notable for the following components:   WBC 10.6 (*)    RBC 3.13 (*)    Hemoglobin 10.4 (*)    HCT 32.4 (*)    MCV 103.5 (*)    All other components within normal limits  URINALYSIS, ROUTINE W REFLEX MICROSCOPIC    EKG EKG Interpretation  Date/Time:  Monday May 24 2021 14:19:39 EDT Ventricular Rate:  58 PR Interval:  152 QRS Duration: 101 QT Interval:  459 QTC Calculation: 451 R Axis:   -9 Text Interpretation: Sinus rhythm Left atrial enlargement Consider anterior infarct Baseline wander in lead(s) V3 No significant change since last tracing Confirmed by Susy Frizzle [phone]) on 05/24/2021 3:13:44 PM  Radiology CT Head Wo Contrast  Result Date: 05/24/2021 CLINICAL DATA:  Altered mental status. EXAM: CT HEAD WITHOUT CONTRAST TECHNIQUE: Contiguous axial images were obtained from the base of the skull through the vertex without intravenous contrast. COMPARISON:  April 05, 2021 FINDINGS: Brain: There is mild cerebral atrophy with widening of the extra-axial spaces and ventricular dilatation. There are areas of decreased attenuation within the white matter tracts of the supratentorial brain, consistent with microvascular disease changes. Chronic bilateral basal ganglia lacunar infarcts are seen. Vascular: No hyperdense vessel or unexpected calcification. Skull: Normal. Negative for fracture or focal lesion. Sinuses/Orbits: No acute finding. Other: None. IMPRESSION: 1. Generalized cerebral atrophy. 2. Chronic bilateral basal ganglia lacunar infarcts. 3. No acute  intracranial abnormality. Electronically Signed   By: Aram Candela M.D.   On: 05/24/2021 15:13    Procedures Procedures  Medications Ordered in the ED Medications - No data to display   MDM Rules/Calculators/A&P MDM Patient with weakness, ?dysphagia and R hand discoordination. Will check CT head, basic labs.   ED Course  I have reviewed the triage vital signs and the nursing notes.  Pertinent labs & imaging results that were available during my care of the patient were reviewed by me and considered in my medical decision making (see chart for details).  Clinical Course as of 05/24/21 1530  Mon May 24, 2021  1506 CBC with mild leukocytosis and anemia.  [CS]  1513 Patient with possible stroke like symptoms is not a candidate for tPA given duration since symptom onset and acute stroke less than 2 months ago.  [CS]  1530 Care of the patient signed out at the change of shift.  [CS]    Clinical Course User Index [CS] Pollyann Savoy, MD    Final Clinical Impression(s) / ED Diagnoses Final diagnoses:  None    Rx / DC Orders ED Discharge Orders     None        Pollyann Savoy, MD 05/24/21 1530

## 2021-05-24 NOTE — ED Provider Notes (Signed)
Patient signed out to me is pending consultation with hospitalist for admission.  Reportedly has a baseline history of dementia, lives with daughter, family noticed increased generalized weakness perhaps right upper remedy weakness and difficulty swallowing for the past 2 to 3 days.  Labs are otherwise unremarkable, blood pressure mildly elevated.  CT imaging shows evidence of prior strokes but no acute findings.  Will be brought into the hospitalist team.    Cheryll Cockayne, MD 05/24/21 1718

## 2021-05-24 NOTE — ED Notes (Signed)
Attempted report x1. 

## 2021-05-24 NOTE — H&P (Addendum)
History and Physical    Joshua Morales GGE:[id number] DOB: [birth year] DOA: 05/24/2021  PCP: [facility]   Patient coming from: Home  I have personally briefly reviewed patient's old medical records in [facility]  Chief Complaint: Right side weakness  HPI: Joshua Morales is a 75 y.o. male with medical history significant for dementia, diabetes mellitus, hypertension, stroke. Patient was brought to the ED via EMS reports of being slow, and weak.  History is limited from patient due to baseline dementia.  Daughter [name] who patient lives with, is present at bedside, and helps with the history. Per reports symptoms started on Saturday,  2 days ago.  It was noted that he was unable to use his right hand to put on his lighter.  She also reports drooling from the right side of patient's mouth the next day Sunday, and patient was finding it hard to use his right lower extremity.  He was also leaning to the right when he tried to walk.  At baseline he uses a cane or walker most times to ambulate. Daughter reports patient has not been able to swallow his pills.  But he ate yesterday and today.  She reports occasional coughing when patient is eating.  On talking to patient, he reports difficulty swallowing his pills only, but no difficulty swallowing.  Recent hospitalizations 9/1- 9/4 for AKI, secondary to diarrhea, also diagnosed with enterotoxigenic E. coli as etiology for diarrhea.  Also though to be Colonized with C. Difficile.  8/1- 8/4 for acute CVA with multiple punctate acute/subacute infarcts in the bilateral anterior circulation suggesting embolic event, TEE showed atrial thrombus so he was started on full anticoagulation with apixaban.  Daughter reports his stools are becoming more formed, he had about 4 bowel movements yesterday, none today so far.  ED Course: Blood pressure systolic 160s to 465K otherwise stable vitals.  Blood glucose 53.  Creatinine elevated 2.41.   WBC 10.6.  UA not suggestive of infection.  Head CT shows no acute abnormality, shows chronic infarcts. Hospitalist to admit for altered mental status, possibility of stroke.  Symptoms have been ongoing for about 2 days so code stroke was not called.  Review of Systems: As per HPI all other systems reviewed and negative.  Past Medical History:  Diagnosis Date   Agent orange exposure    Atrial thrombus 05/05/2021   CRD (chronic renal disease), stage 3b (HCC) 05/05/2021   Dementia (HCC)    Diabetes mellitus without complication (HCC)    Hypertension    Macrocytic anemia 05/05/2021    Past Surgical History:  Procedure Laterality Date   BUBBLE STUDY  04/07/2021   Procedure: BUBBLE STUDY;  Surgeon: Jonelle Sidle, MD;  Location: [facility];  Service: Cardiovascular;;  with TEE   LUNG REMOVAL, PARTIAL     TEE WITHOUT CARDIOVERSION N/A 04/07/2021   Procedure: TRANSESOPHAGEAL ECHOCARDIOGRAM (TEE);  Surgeon: Jonelle Sidle, MD;  Location: [facility];  Service: Cardiovascular;  Laterality: N/A;   TOE AMPUTATION       reports that he has been smoking cigarettes. He does not have any smokeless tobacco history on file. He reports current alcohol use. He reports that he does not use drugs.  No Known Allergies  Family History  Problem Relation Age of Onset   Congestive Heart Failure Mother    Diabetes Mellitus I Mother    Dementia Father    Throat cancer Brother     Prior to Admission medications  Medication Sig Start Date End Date Taking? Authorizing Provider  ACIDOPHILUS LACTOBACILLUS PO Take 2 capsules by mouth in the morning and at bedtime.    [provider]  apixaban (ELIQUIS) 2.5 MG TABS tablet Take 1 tablet (2.5 mg total) by mouth 2 (two) times daily. 04/08/21   Johnson, Clanford L, MD  atenolol (TENORMIN) 25 MG tablet Take 1 tablet (25 mg total) by mouth daily. 04/08/21   Johnson, Clanford L, MD  atorvastatin (LIPITOR) 40 MG tablet Take 40 mg by mouth daily.    [provider]  Cholecalciferol (D3-1000) 25 MCG (1000 UT) tablet Take 2,000 Units by mouth daily.    [provider]  donepezil (ARICEPT) 5 MG tablet Take 1 tablet (5 mg total) by mouth at bedtime. 04/08/21   Johnson, Clanford L, MD  folic acid (FOLVITE) 1 MG tablet Take 1 tablet (1 mg total) by mouth daily. 04/08/21   Johnson, Clanford L, MD  hydrALAZINE (APRESOLINE) 50 MG tablet Take 1 tablet (50 mg total) by mouth every 8 (eight) hours. 05/09/21   Rodolph Bong, MD  hydrOXYzine (ATARAX/VISTARIL) 10 MG tablet Take 10 mg by mouth 2 (two) times daily as needed for itching.    [provider]  insulin aspart (NOVOLOG) 100 UNIT/ML FlexPen Inject 2 Units into the skin 3 (three) times daily with meals. IF EATS 50% OR MORE OF MEAL. Patient taking differently: Inject 4 Units into the skin 3 (three) times daily with meals. IF EATS 50% OR MORE OF MEAL. 04/08/21   Johnson, Clanford L, MD  insulin detemir (LEVEMIR) 100 UNIT/ML FlexPen Inject 8 Units into the skin daily.    [provider]  Insulin Pen Needle 31G X 5 MM MISC 1 Device by Does not apply route as directed. 04/08/21   Johnson, Clanford L, MD  memantine (NAMENDA) 5 MG tablet Take 1 tablet (5 mg total) by mouth 2 (two) times daily. 04/08/21   Johnson, Clanford L, MD  mirtazapine (REMERON) 15 MG tablet TAKE ONE TABLET BY MOUTH AT BEDTIME TO STIMULATE APPETITE 08/07/20   [provider]  Multiple Vitamin (MULTIVITAMIN WITH MINERALS) TABS tablet Take 1 tablet by mouth daily. 04/08/21   Johnson, Clanford L, MD  polyethylene glycol (MIRALAX / GLYCOLAX) 17 g packet Take 17 g by mouth daily as needed for mild constipation. Patient not taking: No sig reported 04/08/21   Standley Dakins L, MD  QUEtiapine (SEROQUEL) 25 MG tablet Take 1 tablet (25 mg total) by mouth at bedtime. 04/08/21   Johnson, Clanford L, MD  thiamine 100 MG tablet Take 1 tablet (100 mg total) by mouth daily. 04/08/21   Cleora Fleet, MD    Physical  Exam: Vitals:   05/24/21 1400 05/24/21 1428 05/24/21 1538 05/24/21 1630  BP: (!) 189/91  (!) 170/87 (!) 189/80  Pulse: (!) 57  (!) 57 62  Resp: 13  18 12   Temp:      TempSrc:      SpO2: 100%  100% 100%  Weight:  55.8 kg    Height:  6\' 1"  (1.854 m)      Constitutional: NAD, calm, comfortable Vitals:   05/24/21 1400 05/24/21 1428 05/24/21 1538 05/24/21 1630  BP: (!) 189/91  (!) 170/87 (!) 189/80  Pulse: (!) 57  (!) 57 62  Resp: 13  18 12   Temp:      TempSrc:      SpO2: 100%  100% 100%  Weight:  55.8 kg    Height:  6\' 1"  (1.854 m)     Eyes: PERRL, lids and conjunctivae normal ENMT: Mucous membranes are moist.   Neck: normal, supple, no masses, no thyromegaly Respiratory: clear to auscultation bilaterally, no wheezing, no crackles. Normal respiratory effort. No accessory muscle use.  Cardiovascular: Bradycardia, Regular rate and rhythm, no murmurs / rubs / gallops. No extremity edema. 2+ pedal pulses.  Abdomen: no tenderness, no masses palpated. No hepatosplenomegaly. Bowel sounds positive.  Musculoskeletal: no clubbing / cyanosis. No joint deformity upper and lower extremities. Good ROM, no contractures. Normal muscle tone.  Skin: no rashes, lesions, ulcers. No induration Neurologic:   Neurological:     Mental Status: he is alert, able to answer simple questions appropriately    GCS: GCS eye subscore is 4. GCS verbal subscore is 5. GCS motor subscore is 6.     Comments: Mental Status:  Alert, oriented to person, thought content appropriate, low-volume speech, reports speech is fluent without evidence of aphasia.   Cranial Nerves:  II:  pupils equal, round, reactive to light III,IV, VI: ptosis not present, extra-ocular motions intact bilaterally  V,VII: smile symmetric, eyebrows raise symmetric, facial light touch sensation equal VIII: hearing grossly normal to voice  XII: midline tongue extension without fassiculations Motor:  Normal tone.  5/5 strength in all extremities.    Sensory: Sensation intact to light touch in all extremities.  Psychiatric: Normal judgment and insight. Alert and oriented x person. . Normal mood.   Labs on Admission: I have personally reviewed following labs and imaging studies  CBC: Recent Labs  Lab 05/24/21 1425  WBC 10.6*  NEUTROABS 5.6  HGB 10.4*  HCT 32.4*  MCV 103.5*  PLT 260   Basic Metabolic Panel: Recent Labs  Lab 05/24/21 1425  NA 140  K 4.0  CL 109  CO2 19*  GLUCOSE 53*  BUN 36*  CREATININE 2.41*  CALCIUM 9.6   Liver Function Tests: Recent Labs  Lab 05/24/21 1425  AST 26  ALT 31  ALKPHOS 53  BILITOT 0.8  PROT 7.5  ALBUMIN 3.9   Urine analysis:    Component Value Date/Time   COLORURINE YELLOW 05/24/2021 1631   APPEARANCEUR CLEAR 05/24/2021 1631   LABSPEC 1.013 05/24/2021 1631   PHURINE 5.0 05/24/2021 1631   GLUCOSEU 50 (A) 05/24/2021 1631   HGBUR MODERATE (A) 05/24/2021 1631   BILIRUBINUR NEGATIVE 05/24/2021 1631   KETONESUR NEGATIVE 05/24/2021 1631   PROTEINUR 100 (A) 05/24/2021 1631   NITRITE NEGATIVE 05/24/2021 1631   LEUKOCYTESUR NEGATIVE 05/24/2021 1631    Radiological Exams on Admission: CT Head Wo Contrast  Result Date: 05/24/2021 CLINICAL DATA:  Altered mental status. EXAM: CT HEAD WITHOUT CONTRAST TECHNIQUE: Contiguous axial images were obtained from the base of the skull through the vertex without intravenous contrast. COMPARISON:  April 05, 2021 FINDINGS: Brain: There is mild cerebral atrophy with widening of the extra-axial spaces and ventricular dilatation. There are areas of decreased attenuation within the white matter tracts of the supratentorial brain, consistent with microvascular disease changes. Chronic bilateral basal ganglia lacunar infarcts are seen. Vascular: No hyperdense vessel or unexpected calcification. Skull: Normal. Negative for fracture or focal lesion. Sinuses/Orbits: No acute finding. Other: None. IMPRESSION: 1. Generalized cerebral atrophy. 2. Chronic  bilateral basal ganglia lacunar infarcts. 3. No acute intracranial abnormality. Electronically Signed   By: April 07, 2021 M.D.   On: 05/24/2021 15:13    EKG: Independently reviewed.   Assessment/Plan Principal Problem:   Right sided weakness Active Problems:   AKI (acute  kidney injury) (HCC)   Diabetes mellitus type 2, uncontrolled (HCC)   Dementia associated with alcoholism (HCC)   Stroke (cerebrum) (HCC)   Atrial thrombus   CRD (chronic renal disease), stage 3b (HCC)   Mild Acute kidney injury CKD 3B- creatinine 2.41, recent discharge creatinine 1.92 weeks ago. - Ns 100cc/hr x 20hrs  Right-sided weakness, drooling right side-symptoms appear to have resolved.  ?  Dysphagia.  No objective neurologic deficits on exam.  Unsure if these symptoms were from generalized weakness, versus actual strokelike symptoms.  Head CT shows chronic infarcts without acute abnormality.  He is on Eliquis. -Continue Eliquis, crushed -Stroke swallow evaluation -PT/ Speech therapy evaluation - MRI brain  Uncontrolled diabetes mellitus with hypoglycemia-glucose 53 > 108, after 25 minutes of D50 was given.  Patient is on both short acting and long-acting insulin, Levemir 8 units nightly.   - SSI- S -Hold home insulins for now  Dementia, CVA hx- stable. CVA 04/2021- multiple punctate acute/subacute infarcts in the bilateral anterior circulation suggesting embolic event, TEE showed atrial thrombus so he was started on full anticoagulation with apixaban. -Hold nonessential meds, mirtazapine, Seroquel, memantine, hydroxyzine donepezil for now pending speech therapy evaluation. -Continue Eliquis  Atrial thrombus -Continue Eliquis  HTN-elevated. Hydralazine started on after recent hospitalization, but patient has not started taking it.  -Resume hydralazine, atenolol - As needed IV Hydralazine for systolic > 190.   DVT prophylaxis:  Eliquis Code Status: Full code Family Communication: Daughter- Chartered loss adjuster  at bedside Disposition Plan: ~ 1- 2 days Consults called: None Admission status: Obs tele   Onnie Boer MD [facility]  05/24/2021, 7:49 PM

## 2021-05-25 ENCOUNTER — Observation Stay (HOSPITAL_COMMUNITY): Payer: No Typology Code available for payment source

## 2021-05-25 ENCOUNTER — Encounter (HOSPITAL_COMMUNITY): Payer: Self-pay | Admitting: Internal Medicine

## 2021-05-25 DIAGNOSIS — N1832 Chronic kidney disease, stage 3b: Secondary | ICD-10-CM | POA: Diagnosis present

## 2021-05-25 DIAGNOSIS — E1122 Type 2 diabetes mellitus with diabetic chronic kidney disease: Secondary | ICD-10-CM | POA: Diagnosis present

## 2021-05-25 DIAGNOSIS — Z681 Body mass index (BMI) 19 or less, adult: Secondary | ICD-10-CM | POA: Diagnosis not present

## 2021-05-25 DIAGNOSIS — D539 Nutritional anemia, unspecified: Secondary | ICD-10-CM | POA: Diagnosis present

## 2021-05-25 DIAGNOSIS — F17210 Nicotine dependence, cigarettes, uncomplicated: Secondary | ICD-10-CM | POA: Diagnosis present

## 2021-05-25 DIAGNOSIS — N179 Acute kidney failure, unspecified: Secondary | ICD-10-CM | POA: Diagnosis present

## 2021-05-25 DIAGNOSIS — I6340 Cerebral infarction due to embolism of unspecified cerebral artery: Secondary | ICD-10-CM | POA: Diagnosis present

## 2021-05-25 DIAGNOSIS — Z794 Long term (current) use of insulin: Secondary | ICD-10-CM | POA: Diagnosis not present

## 2021-05-25 DIAGNOSIS — I639 Cerebral infarction, unspecified: Secondary | ICD-10-CM | POA: Diagnosis not present

## 2021-05-25 DIAGNOSIS — I129 Hypertensive chronic kidney disease with stage 1 through stage 4 chronic kidney disease, or unspecified chronic kidney disease: Secondary | ICD-10-CM | POA: Diagnosis present

## 2021-05-25 DIAGNOSIS — Z79899 Other long term (current) drug therapy: Secondary | ICD-10-CM | POA: Diagnosis not present

## 2021-05-25 DIAGNOSIS — F0150 Vascular dementia without behavioral disturbance: Secondary | ICD-10-CM | POA: Diagnosis present

## 2021-05-25 DIAGNOSIS — R531 Weakness: Secondary | ICD-10-CM | POA: Diagnosis present

## 2021-05-25 DIAGNOSIS — E1165 Type 2 diabetes mellitus with hyperglycemia: Secondary | ICD-10-CM | POA: Diagnosis present

## 2021-05-25 DIAGNOSIS — E440 Moderate protein-calorie malnutrition: Secondary | ICD-10-CM | POA: Diagnosis present

## 2021-05-25 DIAGNOSIS — E11649 Type 2 diabetes mellitus with hypoglycemia without coma: Secondary | ICD-10-CM | POA: Diagnosis not present

## 2021-05-25 DIAGNOSIS — Z902 Acquired absence of lung [part of]: Secondary | ICD-10-CM | POA: Diagnosis not present

## 2021-05-25 DIAGNOSIS — E785 Hyperlipidemia, unspecified: Secondary | ICD-10-CM | POA: Diagnosis present

## 2021-05-25 DIAGNOSIS — G8321 Monoplegia of upper limb affecting right dominant side: Secondary | ICD-10-CM | POA: Diagnosis present

## 2021-05-25 DIAGNOSIS — R1310 Dysphagia, unspecified: Secondary | ICD-10-CM | POA: Diagnosis present

## 2021-05-25 DIAGNOSIS — Z7901 Long term (current) use of anticoagulants: Secondary | ICD-10-CM | POA: Diagnosis not present

## 2021-05-25 DIAGNOSIS — F1027 Alcohol dependence with alcohol-induced persisting dementia: Secondary | ICD-10-CM

## 2021-05-25 DIAGNOSIS — I513 Intracardiac thrombosis, not elsewhere classified: Secondary | ICD-10-CM | POA: Diagnosis present

## 2021-05-25 DIAGNOSIS — Z8673 Personal history of transient ischemic attack (TIA), and cerebral infarction without residual deficits: Secondary | ICD-10-CM | POA: Diagnosis not present

## 2021-05-25 LAB — BASIC METABOLIC PANEL
Anion gap: 4 — ABNORMAL LOW (ref 5–15)
BUN: 32 mg/dL — ABNORMAL HIGH (ref 8–23)
CO2: 23 mmol/L (ref 22–32)
Calcium: 8.7 mg/dL — ABNORMAL LOW (ref 8.9–10.3)
Chloride: 109 mmol/L (ref 98–111)
Creatinine, Ser: 2.1 mg/dL — ABNORMAL HIGH (ref 0.61–1.24)
GFR, Estimated: 33 mL/min — ABNORMAL LOW (ref >60)
Glucose, Bld: 308 mg/dL — ABNORMAL HIGH (ref 70–99)
Potassium: 4.4 mmol/L (ref 3.5–5.1)
Sodium: 136 mmol/L (ref 135–145)

## 2021-05-25 LAB — CBC
HCT: 26.1 % — ABNORMAL LOW (ref 39.0–52.0)
Hemoglobin: 8.6 g/dL — ABNORMAL LOW (ref 13.0–17.0)
MCH: 34 pg (ref 26.0–34.0)
MCHC: 33 g/dL (ref 30.0–36.0)
MCV: 103.2 fL — ABNORMAL HIGH (ref 80.0–100.0)
Platelets: 204 10*3/uL (ref 150–400)
RBC: 2.53 MIL/uL — ABNORMAL LOW (ref 4.22–5.81)
RDW: 14.1 % (ref 11.5–15.5)
WBC: 8 10*3/uL (ref 4.0–10.5)
nRBC: 0 % (ref 0.0–0.2)

## 2021-05-25 LAB — GLUCOSE, CAPILLARY
Glucose-Capillary: 284 mg/dL — ABNORMAL HIGH (ref 70–99)
Glucose-Capillary: 191 mg/dL — ABNORMAL HIGH (ref 70–99)
Glucose-Capillary: 114 mg/dL — ABNORMAL HIGH (ref 70–99)
Glucose-Capillary: 48 mg/dL — ABNORMAL LOW (ref 70–99)
Glucose-Capillary: 51 mg/dL — ABNORMAL LOW (ref 70–99)
Glucose-Capillary: 287 mg/dL — ABNORMAL HIGH (ref 70–99)
Glucose-Capillary: 189 mg/dL — ABNORMAL HIGH (ref 70–99)

## 2021-05-25 MED ORDER — INSULIN ASPART 100 UNIT/ML IJ SOLN
0.0000 [IU] | INTRAMUSCULAR | Status: DC
  Administered 2021-05-25: 3 [IU] via SUBCUTANEOUS
  Administered 2021-05-25 – 2021-05-26 (×2): 1 [IU] via SUBCUTANEOUS

## 2021-05-25 MED ORDER — ATORVASTATIN CALCIUM 40 MG PO TABS
40.0000 mg | ORAL_TABLET | Freq: Every day | ORAL | Status: DC
  Administered 2021-05-25 – 2021-05-26 (×2): 40 mg via ORAL
  Filled 2021-05-25 (×2): qty 1

## 2021-05-25 MED ORDER — DONEPEZIL HCL 5 MG PO TABS
5.0000 mg | ORAL_TABLET | Freq: Every day | ORAL | Status: DC
  Administered 2021-05-25 – 2021-05-26 (×2): 5 mg via ORAL
  Filled 2021-05-25 (×2): qty 1

## 2021-05-25 MED ORDER — FOLIC ACID 1 MG PO TABS
1.0000 mg | ORAL_TABLET | Freq: Every day | ORAL | Status: DC
  Administered 2021-05-25 – 2021-05-27 (×3): 1 mg via ORAL
  Filled 2021-05-25 (×3): qty 1

## 2021-05-25 MED ORDER — RIVAROXABAN 15 MG PO TABS: 15.0000 mg | ORAL_TABLET | Freq: Every day | ORAL | Status: DC

## 2021-05-25 MED ORDER — QUETIAPINE FUMARATE 25 MG PO TABS
25.0000 mg | ORAL_TABLET | Freq: Every day | ORAL | Status: DC
  Administered 2021-05-25 – 2021-05-26 (×2): 25 mg via ORAL
  Filled 2021-05-25 (×2): qty 1

## 2021-05-25 MED ORDER — RIVAROXABAN 15 MG PO TABS
15.0000 mg | ORAL_TABLET | Freq: Every day | ORAL | Status: DC
  Administered 2021-05-26: 15 mg via ORAL
  Filled 2021-05-25: qty 1

## 2021-05-25 MED ORDER — MEMANTINE HCL 10 MG PO TABS
5.0000 mg | ORAL_TABLET | Freq: Two times a day (BID) | ORAL | Status: DC
  Administered 2021-05-25 – 2021-05-27 (×4): 5 mg via ORAL
  Filled 2021-05-25 (×4): qty 1

## 2021-05-25 NOTE — Evaluation (Signed)
Clinical/Bedside Swallow Evaluation Patient Details  Name: Joshua Morales MRN: [id number] Date of Birth: 1946/05/08  Today's Date: 05/25/2021 Time: SLP Start Time (ACUTE ONLY): 0930 SLP Stop Time (ACUTE ONLY): 0949 SLP Time Calculation (min) (ACUTE ONLY): 19 min  Past Medical History:  Past Medical History:  Diagnosis Date   Agent orange exposure    Atrial thrombus 05/05/2021   CRD (chronic renal disease), stage 3b (HCC) 05/05/2021   Dementia (HCC)    Diabetes mellitus without complication (HCC)    Hypertension    Macrocytic anemia 05/05/2021   Past Surgical History:  Past Surgical History:  Procedure Laterality Date   BUBBLE STUDY  04/07/2021   Procedure: BUBBLE STUDY;  Surgeon: Jonelle Sidle, MD;  Location: [facility];  Service: Cardiovascular;;  with TEE   LUNG REMOVAL, PARTIAL     TEE WITHOUT CARDIOVERSION N/A 04/07/2021   Procedure: TRANSESOPHAGEAL ECHOCARDIOGRAM (TEE);  Surgeon: Jonelle Sidle, MD;  Location: [facility];  Service: Cardiovascular;  Laterality: N/A;   TOE AMPUTATION     HPI:  Joshua Morales is a 75 y.o. male with medical history significant for dementia, diabetes mellitus, hypertension, stroke.  Patient was brought to the ED via EMS reports of being slow, and weak.  History is limited from patient due to baseline dementia.  Daughter Joshua Morales lives with the Pt. Reports of R sided weakness. Recent hospitalizations 9/1-9/4 for AKI & 8/1-8/4 for acute CVA with multiple punctate acute/subacute infarcts in the bilateral anterior circulation suggestiong embolic event. Reports of difficulty swallowing pills per H&P. CT reveals no acute intracranial abnormality, however, MRI is pending. Bedside swallowing evaluation requested.    Assessment / Plan / Recommendation  Clinical Impression  Clinical swallowing evaluation completed while Pt was sitting upright in bed; Pt is edentulous and without facial asymmetry. Pt consumed thin liquids, puree textures and regular textures  without overt s/sx of aspiration. Pt did require additional time to thoroughly masticate regular textures and benefits from liquids wash to facilitate oral clearance of bolus/residue. Recommend initiate D3/mech soft diet (secondary to edentulous status and Pt request) and thin liquids. Meds are ok whole in liquid, however, if Pt has difficulty as reported try pills whole with applesauce, pudding or yogurt or crush with puree if needed. There are no further ST needs noted at this time, ST will sign off. Thank you for this referral. SLP Visit Diagnosis: Dysphagia, unspecified (R13.10)    Aspiration Risk  Mild aspiration risk    Diet Recommendation Dysphagia 3 (Mech soft);Thin liquid   Liquid Administration via: Cup;Straw Medication Administration: Whole meds with liquid Supervision: Patient able to self feed;Intermittent supervision to cue for compensatory strategies Compensations: Slow rate;Small sips/bites;Follow solids with liquid Postural Changes: Seated upright at 90 degrees    Other  Recommendations Oral Care Recommendations: Oral care BID    Recommendations for follow up therapy are one component of a multi-disciplinary discharge planning process, led by the attending physician.  Recommendations may be updated based on patient status, additional functional criteria and insurance authorization.  Follow up Recommendations None        Swallow Study   General Date of Onset: 05/24/21 HPI: Joshua Morales is a 75 y.o. male with medical history significant for dementia, diabetes mellitus, hypertension, stroke.  Patient was brought to the ED via EMS reports of being slow, and weak.  History is limited from patient due to baseline dementia.  Daughter Joshua Morales lives with the Pt. Reports of R sided weakness. Recent hospitalizations 9/1-9/4 for  AKI & 8/1-8/4 for acute CVA with multiple punctate acute/subacute infarcts in the bilateral anterior circulation suggestiong embolic event. Reports of  difficulty swallowing pills per H&P. CT reveals no acute intracranial abnormality, however, MRI is pending. Bedside swallowing evaluation requested. Type of Study: Bedside Swallow Evaluation Previous Swallow Assessment: BSE on 04/08/21 with reg/thin recommended and no f/u Diet Prior to this Study: NPO Temperature Spikes Noted: No Respiratory Status: Room air History of Recent Intubation: No Behavior/Cognition: Alert;Cooperative;Pleasant mood Oral Cavity Assessment: Within Functional Limits Oral Care Completed by SLP: Recent completion by staff Oral Cavity - Dentition: Edentulous Vision: Functional for self-feeding Self-Feeding Abilities: Able to feed self Patient Positioning: Upright in bed Baseline Vocal Quality: Low vocal intensity Volitional Cough: Strong Volitional Swallow: Able to elicit    Oral/Motor/Sensory Function Overall Oral Motor/Sensory Function: Within functional limits   Ice Chips Ice chips: Within functional limits   Thin Liquid Thin Liquid: Within functional limits    Nectar Thick Nectar Thick Liquid: Not tested   Honey Thick Honey Thick Liquid: Not tested   Puree Puree: Within functional limits   Solid     Solid: Within functional limits     Joshua Morales H. Romie Levee, CCC-SLP Speech Language Pathologist  Georgetta Haber 05/25/2021,10:36 AM

## 2021-05-25 NOTE — TOC Progression Note (Signed)
Transition of Care [facility]) - Progression Note    Patient Details  Name: Joshua Morales MRN: [id number] Date of Birth: [birth year]  [facility] CM/SW Contact  Karn Cassis, [place] Phone Number: 05/25/2021, 9:10 AM  Clinical Narrative:   [facility] notification completed. Notification ID: [id number].         Expected Discharge Plan and Services                                                 Social Determinants of Health (SDOH) Interventions    Readmission Risk Interventions No flowsheet data found.

## 2021-05-25 NOTE — Evaluation (Signed)
Physical Therapy Evaluation Patient Details Name: Joshua Morales MRN: [id number] DOB: 12/02/1945 Today's Date: 05/25/2021  History of Present Illness  Joshua Morales is a 75 y.o. male with medical history significant for dementia, diabetes mellitus, hypertension, stroke.  Patient was brought to the ED via EMS reports of being slow, and weak.  History is limited from patient due to baseline dementia.  Daughter Joshua Morales who patient lives with, is present at bedside, and helps with the history.  Per reports symptoms started on Saturday,  2 days ago.  It was noted that he was unable to use his right hand to put on his lighter.  She also reports drooling from the right side of patient's mouth the next day Sunday, and patient was finding it hard to use his right lower extremity.  He was also leaning to the right when he tried to walk.  At baseline he uses a cane or walker most times to ambulate.  Daughter reports patient has not been able to swallow his pills.  But he ate yesterday and today.  She reports occasional coughing when patient is eating.  On talking to patient, he reports difficulty swallowing his pills only, but no difficulty swallowing.   Clinical Impression  Patient demonstrates slow labored movement for sitting up at bedside with [facility] bed flat, unable to complete sit to stands using SPC due to weakness, required use of RW for safety demonstrating slow labored cadence with narrow base of support and decreased right ankle dorsiflexion, most difficulty making turns due to scissoring of legs and limited due to c/o fatigue.  Patient able to sit up in wheelchair to be taken for a MRI with nursing staff after therapy.  Patient will benefit from continued physical therapy in hospital and recommended venue below to increase strength, balance, endurance for safe ADLs and gait.        Recommendations for follow up therapy are one component of a multi-disciplinary discharge planning process, led by the  attending physician.  Recommendations may be updated based on patient status, additional functional criteria and insurance authorization.  Follow Up Recommendations SNF;Supervision for mobility/OOB;Supervision - Intermittent    Equipment Recommendations  None recommended by PT    Recommendations for Other Services       Precautions / Restrictions Precautions Precautions: Fall Restrictions Weight Bearing Restrictions: No      Mobility  Bed Mobility Overal bed mobility: Needs Assistance Bed Mobility: Supine to Sit     Supine to sit: Supervision     General bed mobility comments: increased time, labored movement    Transfers Overall transfer level: Needs assistance Equipment used: Rolling walker (2 wheeled);Straight cane Transfers: Sit to/from [employer] Sit to Stand: Min assist Stand pivot transfers: Min assist       General transfer comment: unable to complete sit to stands using SPC due to BLE weakness, required use of RW  Ambulation/Gait Ambulation/Gait assistance: Min assist;Mod assist Gait Distance (Feet): 25 Feet Assistive device: Rolling walker (2 wheeled) Gait Pattern/deviations: Decreased step length - right;Decreased step length - left;Decreased stride length;Decreased dorsiflexion - right;Narrow base of support;Scissoring Gait velocity: decreased   General Gait Details: slow labored cadence with decreased right ankle dorsflexion and scissoring of legs when making turns due to narrow base of support, limited mostly due to c/o fatigue  Stairs            Wheelchair Mobility    Modified Rankin (Stroke Patients Only)       Balance Overall  balance assessment: Needs assistance Sitting-balance support: Feet supported;No upper extremity supported Sitting balance-Leahy Scale: Good Sitting balance - Comments: seated at EOB   Standing balance support: During functional activity;Single extremity supported Standing balance-Leahy  Scale: Poor Standing balance comment: fair/poor using RW                             Pertinent Vitals/Pain Pain Assessment: No/denies pain    Home Living Family/patient expects to be discharged to:: Private residence Living Arrangements: Children;Spouse/significant other Available Help at Discharge: Family;Available 24 hours/day Type of Home: House Home Access: Stairs to enter Entrance Stairs-Rails: Right;Left;Can reach both Entrance Stairs-Number of Steps: 5 Home Layout: One level;Two level;Able to live on main level with bedroom/bathroom;Full bath on main level Home Equipment: Cane - single point;Walker - 2 wheels;Shower seat;Bedside commode;Grab bars - tub/shower Additional Comments: information "per patient"    Prior Function Level of Independence: Needs assistance   Gait / Transfers Assistance Needed: household ambulator using SPC  ADL's / Homemaking Assistance Needed: assisted by family        Hand Dominance   Dominant Hand: Right    Extremity/Trunk Assessment   Upper Extremity Assessment Upper Extremity Assessment: Generalized weakness;RUE deficits/detail;LUE deficits/detail RUE Deficits / Details: grossly -4/5 RUE Sensation: WNL RUE Coordination: WNL LUE Deficits / Details: grossly 4+/5 LUE Sensation: WNL LUE Coordination: WNL    Lower Extremity Assessment Lower Extremity Assessment: Generalized weakness;RLE deficits/detail RLE Deficits / Details: grossly -4/5 except ankle dorsiflexion 3+/5 RLE Sensation: WNL RLE Coordination: decreased gross motor    Cervical / Trunk Assessment Cervical / Trunk Assessment: Kyphotic  Communication   Communication: No difficulties  Cognition Arousal/Alertness: Awake/alert Behavior During Therapy: WFL for tasks assessed/performed Overall Cognitive Status: History of cognitive impairments - at baseline                                        General Comments      Exercises      Assessment/Plan    PT Assessment Patient needs continued PT services  PT Problem List Decreased strength;Decreased balance;Decreased activity tolerance;Decreased mobility       PT Treatment Interventions DME instruction;Gait training;Stair training;Functional mobility training;Therapeutic activities;Therapeutic exercise;Patient/family education;Balance training;Neuromuscular re-education    PT Goals (Current goals can be found in the Care Plan section)  Acute Rehab PT Goals Patient Stated Goal: return home with family to assist PT Goal Formulation: With patient Time For Goal Achievement: 06/08/21 Potential to Achieve Goals: Good    Frequency Min 3X/week   Barriers to discharge        Co-evaluation               AM-PAC PT "6 Clicks" Mobility  Outcome Measure Help needed turning from your back to your side while in a flat bed without using bedrails?: None Help needed moving from lying on your back to sitting on the side of a flat bed without using bedrails?: None Help needed moving to and from a bed to a chair (including a wheelchair)?: A Lot Help needed standing up from a chair using your arms (e.g., wheelchair or bedside chair)?: A Lot Help needed to walk in hospital room?: A Lot Help needed climbing 3-5 steps with a railing? : A Lot 6 Click Score: 16    End of Session Equipment Utilized During Treatment: Gait belt Activity Tolerance: Patient tolerated  treatment well;Patient limited by fatigue Patient left: in chair;with nursing/sitter in room;Other (comment) (left with nurisng staff to be taken for a MRI) Nurse Communication: Mobility status PT Visit Diagnosis: Unsteadiness on feet (R26.81);Muscle weakness (generalized) (M62.81)    Time: 3202-3343 PT Time Calculation (min) (ACUTE ONLY): 30 min   Charges:   PT Evaluation $PT Eval Moderate Complexity: 1 Mod PT Treatments $Therapeutic Activity: 23-37 mins        12:27 PM, 05/25/21 [name],  MPT Physical Therapist with [facility] [phone] office [phone] mobile phone

## 2021-05-25 NOTE — Consult Note (Signed)
[facility] Joshua Liska A. Gerilyn Pilgrim, MD     [email or web address]          Joshua Morales is an 75 y.o. male.   ASSESSMENT/PLAN: ALTERED MENTAL STATUS DUE TO MULTIPLE EMBOLIC INFARCTS:  This represents therapeutic failure of Eliquis. The patient will therefore be switched to Xarelto.  Vascular dementia: Continue with medications for this.     The patient presents with acute changes in mentation. He was previously seen in the hospital couple months ago for similar presentation her that time he had multiple small infarcts in the posterior of frontal areas suspicious for embolic stroke. This was confirmed with tee. He was placed on Eliquis but at the lowered dose given his renal clearance and weight. Unfortunately presented with recurrent symptoms showing multiple ischemic strokes. The history is obtained from the chart as the patient is confused.  He does not report any complaints however at this time.    GENERAL:  This is a thin man who is resting well in bed.  HEENT:  Normal  ABDOMEN: soft  EXTREMITIES: No edema   BACK:  Normal  SKIN: Normal by inspection.    MENTAL STATUS:  he is awake and alert although sometimes he dozes off. He does follow commands well. Thinks that he is at home. He is not oriented to time. He states his age as 62.  CRANIAL NERVES: Pupils are equal, round and reactive to light and accomodation; extra ocular movements are full, there is no significant nystagmus; visual fields are full; upper and lower facial muscles are normal in strength and symmetric, there is no flattening of the nasolabial folds; tongue is midline; uvula is midline; shoulder elevation is normal. VFF  MOTOR: Normal tone, bulk and strength; no pronator drift.  There is no drift of the upper lower extremities.  COORDINATION: Left finger to nose is normal, right finger to nose is normal, No rest tremor; no intention tremor; no postural tremor; no bradykinesia.  REFLEXES: Deep tendon  reflexes are symmetrical and normal. Plantar reflexes are flexor bilaterally.   SENSATION: Normal to light touch and pain.  He does not extinguish to double simultaneous stimulation.      NEURO CONSULT 04-2021 Acute encephalopathy on baseline dementia with imaging showing multiple bilateral small infarcts suggestive of cardioembolic phenomena. Additional workup with TEE is recommended.  A 30 day event monitor is also recommended. In meantime, dual antiplatelet agents are recommended. Long-term anticoagulation may be warranted depending on further workup. 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 The patient presented however with the acute changes in mentation /altered mental status and significant difficulties getting around.  The daughter reports that he had episode of significant weakness involving the right upper extremity but this resolved after a few weeks.  The other reports no clear focal weakness but global weakness and difficulty getting around recently. There are no reports of headache,, dizziness, chest pain or shortness of breath. The patient is disoriented and cannot provide a history.       Blood pressure (!) 157/73, pulse 60, temperature 97.7 F (36.5 C), temperature source Oral, resp. rate 18, height 6\' 1"  (1.854 m), weight 55.8 kg, SpO2 100 %.  Past Medical History:  Diagnosis Date   Agent orange exposure    Atrial thrombus 05/05/2021   CRD (chronic renal disease), stage 3b (HCC) 05/05/2021  Dementia (HCC)    Diabetes mellitus without complication (HCC)    Hypertension    Macrocytic anemia 05/05/2021    Past Surgical History:  Procedure Laterality Date   BUBBLE STUDY  04/07/2021   Procedure: BUBBLE STUDY;  Surgeon: Jonelle Sidle, MD;  Location: [facility];  Service: Cardiovascular;;  with TEE   LUNG  REMOVAL, PARTIAL     TEE WITHOUT CARDIOVERSION N/A 04/07/2021   Procedure: TRANSESOPHAGEAL ECHOCARDIOGRAM (TEE);  Surgeon: Jonelle Sidle, MD;  Location: [facility];  Service: Cardiovascular;  Laterality: N/A;   TOE AMPUTATION      Family History  Problem Relation Age of Onset   Congestive Heart Failure Mother    Diabetes Mellitus I Mother    Dementia Father    Throat cancer Brother     Social History:  reports that he has been smoking cigarettes. He does not have any smokeless tobacco history on file. He reports current alcohol use. He reports that he does not use drugs.  Allergies: No Known Allergies  Medications: Prior to Admission medications   Medication Sig Start Date End Date Taking? Authorizing Provider  ACIDOPHILUS LACTOBACILLUS PO Take 2 capsules by mouth in the morning and at bedtime.   Yes [provider]  apixaban (ELIQUIS) 2.5 MG TABS tablet Take 1 tablet (2.5 mg total) by mouth 2 (two) times daily. 04/08/21  Yes Johnson, Clanford L, MD  atenolol (TENORMIN) 25 MG tablet Take 1 tablet (25 mg total) by mouth daily. 04/08/21  Yes Johnson, Clanford L, MD  atorvastatin (LIPITOR) 40 MG tablet Take 40 mg by mouth daily.   Yes [provider]  Cholecalciferol (D3-1000) 25 MCG (1000 UT) tablet Take 2,000 Units by mouth daily.   Yes [provider]  donepezil (ARICEPT) 5 MG tablet Take 1 tablet (5 mg total) by mouth at bedtime. 04/08/21  Yes Johnson, Clanford L, MD  folic acid (FOLVITE) 1 MG tablet Take 1 tablet (1 mg total) by mouth daily. 04/08/21  Yes Johnson, Clanford L, MD  hydrOXYzine (ATARAX/VISTARIL) 10 MG tablet Take 10 mg by mouth 2 (two) times daily as needed for itching.   Yes [provider]  insulin aspart (NOVOLOG) 100 UNIT/ML FlexPen Inject 2 Units into the skin 3 (three) times daily with meals. IF EATS 50% OR MORE OF MEAL. Patient taking differently: Inject 4 Units into the skin 3 (three) times daily with meals. IF EATS 50% OR MORE OF MEAL.  04/08/21  Yes Johnson, Clanford L, MD  insulin detemir (LEVEMIR) 100 UNIT/ML FlexPen Inject 8 Units into the skin daily.   Yes [provider]  memantine (NAMENDA) 5 MG tablet Take 1 tablet (5 mg total) by mouth 2 (two) times daily. 04/08/21  Yes Johnson, Clanford L, MD  mirtazapine (REMERON) 15 MG tablet Take 15 mg by mouth at bedtime. 08/07/20  Yes [provider]  Multiple Vitamin (MULTIVITAMIN WITH MINERALS) TABS tablet Take 1 tablet by mouth daily. 04/08/21  Yes Johnson, Clanford L, MD  polyethylene glycol (MIRALAX / GLYCOLAX) 17 g packet Take 17 g by mouth daily as needed for mild constipation. 04/08/21  Yes Johnson, Clanford L, MD  QUEtiapine (SEROQUEL) 25 MG tablet Take 1 tablet (25 mg total) by mouth at bedtime. 04/08/21  Yes Johnson, Clanford L, MD  thiamine 100 MG tablet Take 1 tablet (100 mg total) by mouth daily. 04/08/21  Yes Johnson, Clanford L, MD  hydrALAZINE (APRESOLINE) 50 MG tablet Take 1 tablet (50 mg total) by mouth every 8 (  eight) hours. 05/09/21   Rodolph Bong, MD  Insulin Pen Needle 31G X 5 MM MISC 1 Device by Does not apply route as directed. 04/08/21   Johnson, Clanford L, MD  mirtazapine (REMERON) 15 MG tablet TAKE ONE TABLET BY MOUTH AT BEDTIME TO STIMULATE APPETITE Patient not taking: No sig reported 08/07/20   [provider]    Scheduled Meds:  apixaban  2.5 mg Oral BID   atenolol  25 mg Oral Daily   atorvastatin  40 mg Oral q1800   donepezil  5 mg Oral QHS   folic acid  1 mg Oral Daily   insulin aspart  0-6 Units Subcutaneous Q4H   memantine  5 mg Oral BID   QUEtiapine  25 mg Oral QHS   Continuous Infusions: PRN Meds:.acetaminophen **OR** acetaminophen, hydrALAZINE, ondansetron **OR** ondansetron (ZOFRAN) IV, polyethylene glycol     Results for orders placed or performed during the hospital encounter of 05/24/21 (from the past 48 hour(s))  Comprehensive metabolic panel     Status: Abnormal   Collection Time: 05/24/21  2:25 PM  Result  Value Ref Range   Sodium 140 135 - 145 mmol/L   Potassium 4.0 3.5 - 5.1 mmol/L   Chloride 109 98 - 111 mmol/L   CO2 19 (L) 22 - 32 mmol/L   Glucose, Bld 53 (L) 70 - 99 mg/dL    Comment: Glucose reference range applies only to samples taken after fasting for at least 8 hours.   BUN 36 (H) 8 - 23 mg/dL   Creatinine, Ser 9.48 (H) 0.61 - 1.24 mg/dL   Calcium 9.6 8.9 - 54.6 mg/dL   Total Protein 7.5 6.5 - 8.1 g/dL   Albumin 3.9 3.5 - 5.0 g/dL   AST 26 15 - 41 U/L   ALT 31 0 - 44 U/L   Alkaline Phosphatase 53 38 - 126 U/L   Total Bilirubin 0.8 0.3 - 1.2 mg/dL   GFR, Estimated 28 (L) >60 mL/min    Comment: (NOTE) Calculated using the CKD-EPI Creatinine Equation (2021)    Anion gap 12 5 - 15    Comment: Performed at [facility], [address] [place], [place] [address]  CBC with Differential     Status: Abnormal   Collection Time: 05/24/21  2:25 PM  Result Value Ref Range   WBC 10.6 (H) 4.0 - 10.5 K/uL   RBC 3.13 (L) 4.22 - 5.81 MIL/uL   Hemoglobin 10.4 (L) 13.0 - 17.0 g/dL   HCT 00.9 (L) 38.1 - 82.9 %   MCV 103.5 (H) 80.0 - 100.0 fL   MCH 33.2 26.0 - 34.0 pg   MCHC 32.1 30.0 - 36.0 g/dL   RDW 93.7 16.9 - 67.8 %   Platelets 260 150 - 400 K/uL   nRBC 0.0 0.0 - 0.2 %   Neutrophils Relative % 53 %   Neutro Abs 5.6 1.7 - 7.7 K/uL   Lymphocytes Relative 37 %   Lymphs Abs 3.9 0.7 - 4.0 K/uL   Monocytes Relative 8 %   Monocytes Absolute 0.9 0.1 - 1.0 K/uL   Eosinophils Relative 2 %   Eosinophils Absolute 0.2 0.0 - 0.5 K/uL   Basophils Relative 0 %   Basophils Absolute 0.0 0.0 - 0.1 K/uL   Immature Granulocytes 0 %   Abs Immature Granulocytes 0.04 0.00 - 0.07 K/uL    Comment: Performed at [facility], [address] [place], [place] [address]  Urinalysis, Routine w reflex microscopic Urine, Clean  Catch     Status: Abnormal   Collection Time: 05/24/21  4:31 PM  Result Value Ref Range   Color, Urine YELLOW YELLOW   APPearance CLEAR CLEAR   Specific Gravity, Urine 1.013 1.005 -  1.030   pH 5.0 5.0 - 8.0   Glucose, UA 50 (A) NEGATIVE mg/dL   Hgb urine dipstick MODERATE (A) NEGATIVE   Bilirubin Urine NEGATIVE NEGATIVE   Ketones, ur NEGATIVE NEGATIVE mg/dL   Protein, ur 409 (A) NEGATIVE mg/dL   Nitrite NEGATIVE NEGATIVE   Leukocytes,Ua NEGATIVE NEGATIVE   RBC / HPF 0-5 0 - 5 RBC/hpf   WBC, UA 0-5 0 - 5 WBC/hpf   Bacteria, UA NONE SEEN NONE SEEN   Squamous Epithelial / LPF 0-5 0 - 5    Comment: Performed at [facility], [address], [place], [place] [address]  Urine rapid drug screen (hosp performed)     Status: None   Collection Time: 05/24/21  4:31 PM  Result Value Ref Range   Opiates NONE DETECTED NONE DETECTED   Cocaine NONE DETECTED NONE DETECTED   Benzodiazepines NONE DETECTED NONE DETECTED   Amphetamines NONE DETECTED NONE DETECTED   Tetrahydrocannabinol NONE DETECTED NONE DETECTED   Barbiturates NONE DETECTED NONE DETECTED    Comment: (NOTE) DRUG SCREEN FOR MEDICAL PURPOSES ONLY.  IF CONFIRMATION IS NEEDED FOR ANY PURPOSE, NOTIFY LAB WITHIN 5 DAYS.  LOWEST DETECTABLE LIMITS FOR URINE DRUG SCREEN Drug Class                     Cutoff (ng/mL) Amphetamine and metabolites    1000 Barbiturate and metabolites    200 Benzodiazepine                 200 Tricyclics and metabolites     300 Opiates and metabolites        300 Cocaine and metabolites        300 THC                            50 Performed at [facility], [address], [place], [place] [address]   Ethanol     Status: None   Collection Time: 05/24/21  5:27 PM  Result Value Ref Range   Alcohol, Ethyl (B) <10 <10 mg/dL    Comment: (NOTE) Lowest detectable limit for serum alcohol is 10 mg/dL.  For medical purposes only. Performed at [facility], [address], [place], [place] [address]   CBG monitoring, ED     Status: Abnormal   Collection Time: 05/24/21  5:29 PM  Result Value Ref Range   Glucose-Capillary 62 (L) 70 - 99 mg/dL    Comment: Glucose reference range applies only to  samples taken after fasting for at least 8 hours.  CBG monitoring, ED     Status: Abnormal   Collection Time: 05/24/21  6:56 PM  Result Value Ref Range   Glucose-Capillary 108 (H) 70 - 99 mg/dL    Comment: Glucose reference range applies only to samples taken after fasting for at least 8 hours.  Glucose, capillary     Status: None   Collection Time: 05/24/21 10:38 PM  Result Value Ref Range   Glucose-Capillary 86 70 - 99 mg/dL    Comment: Glucose reference range applies only to samples taken after fasting for at least 8 hours.  Glucose, capillary     Status: Abnormal   Collection Time: 05/24/21 11:56  PM  Result Value Ref Range   Glucose-Capillary 131 (H) 70 - 99 mg/dL    Comment: Glucose reference range applies only to samples taken after fasting for at least 8 hours.  Glucose, capillary     Status: Abnormal   Collection Time: 05/25/21  3:54 AM  Result Value Ref Range   Glucose-Capillary 284 (H) 70 - 99 mg/dL    Comment: Glucose reference range applies only to samples taken after fasting for at least 8 hours.  Basic metabolic panel     Status: Abnormal   Collection Time: 05/25/21  4:55 AM  Result Value Ref Range   Sodium 136 135 - 145 mmol/L   Potassium 4.4 3.5 - 5.1 mmol/L   Chloride 109 98 - 111 mmol/L   CO2 23 22 - 32 mmol/L   Glucose, Bld 308 (H) 70 - 99 mg/dL    Comment: Glucose reference range applies only to samples taken after fasting for at least 8 hours.   BUN 32 (H) 8 - 23 mg/dL   Creatinine, Ser 0.81 (H) 0.61 - 1.24 mg/dL   Calcium 8.7 (L) 8.9 - 10.3 mg/dL   GFR, Estimated 33 (L) >60 mL/min    Comment: (NOTE) Calculated using the CKD-EPI Creatinine Equation (2021)    Anion gap 4 (L) 5 - 15    Comment: Performed at [facility], [address], [place], [place] [address]  CBC     Status: Abnormal   Collection Time: 05/25/21  4:55 AM  Result Value Ref Range   WBC 8.0 4.0 - 10.5 K/uL   RBC 2.53 (L) 4.22 - 5.81 MIL/uL   Hemoglobin 8.6 (L) 13.0 - 17.0 g/dL   HCT  56.3 (L) 14.9 - 52.0 %   MCV 103.2 (H) 80.0 - 100.0 fL   MCH 34.0 26.0 - 34.0 pg   MCHC 33.0 30.0 - 36.0 g/dL   RDW 70.2 63.7 - 85.8 %   Platelets 204 150 - 400 K/uL   nRBC 0.0 0.0 - 0.2 %    Comment: Performed at [facility], [address], [place], [place] [address]  Glucose, capillary     Status: Abnormal   Collection Time: 05/25/21  7:22 AM  Result Value Ref Range   Glucose-Capillary 191 (H) 70 - 99 mg/dL    Comment: Glucose reference range applies only to samples taken after fasting for at least 8 hours.  Glucose, capillary     Status: Abnormal   Collection Time: 05/25/21 11:36 AM  Result Value Ref Range   Glucose-Capillary 51 (L) 70 - 99 mg/dL    Comment: Glucose reference range applies only to samples taken after fasting for at least 8 hours.  Glucose, capillary     Status: Abnormal   Collection Time: 05/25/21 11:56 AM  Result Value Ref Range   Glucose-Capillary 48 (L) 70 - 99 mg/dL    Comment: Glucose reference range applies only to samples taken after fasting for at least 8 hours.  Glucose, capillary     Status: Abnormal   Collection Time: 05/25/21 12:27 PM  Result Value Ref Range   Glucose-Capillary 114 (H) 70 - 99 mg/dL    Comment: Glucose reference range applies only to samples taken after fasting for at least 8 hours.  Glucose, capillary     Status: Abnormal   Collection Time: 05/25/21  4:12 PM  Result Value Ref Range   Glucose-Capillary 287 (H) 70 - 99 mg/dL    Comment: Glucose reference range applies only to  samples taken after fasting for at least 8 hours.    Studies/Results:   BRAIN MRI MRA FINDINGS: MRI HEAD   Brain: New foci of variably reduced diffusion are present including involvement of the anterior left corpus callosum, posterior right corpus callosum, roof of the right temporal horn, inferior left basal ganglia, and right temporal subcortical white matter.   Prominence of the ventricles and sulci reflects stable parenchymal volume loss. Patchy  and confluent T2 hyperintensity in the supratentorial white matter is nonspecific but probably reflects stable chronic microvascular ischemic changes. No mass effect. No extra-axial collection.   Vascular: Major vessel flow voids at the skull base are preserved.   Skull and upper cervical spine: Normal marrow signal is preserved.   Sinuses/Orbits: Paranasal sinuses are aerated. Orbits are unremarkable.   Other: Sella is unremarkable.  Mastoid air cells are clear.   MRA HEAD   Motion artifact is present. Intracranial internal carotid arteries are patent. Apparent inferomedially directed outpouching from the right paraclinoid ICA is not confirmed on the prior study. Middle and anterior cerebral arteries are patent. Intracranial vertebral arteries, basilar artery, posterior cerebral arteries are patent. Appearance is similar to prior study.   IMPRESSION: New small acute and subacute infarcts again involving the anterior circulation bilaterally.   No new proximal intracranial vessel occlusion.           BRAIN MRI 04/2021: IMPRESSION: 1. Multiple punctate acute/sub infarcts in the bilateral anterior circulation suggesting embolic event. 2. No intracranial large vessel occlusion. Mild-to-moderate stenosis of the cavernous left ICA. 3. Mild chronic microvascular ischemic changes and parenchymal volume loss.      TEE 04-2021 1. Left ventricular ejection fraction, by estimation, is 60 to 65%. The  left ventricle has normal function. The left ventricle has no regional  wall motion abnormalities. There is severe left ventricular hypertrophy.   2. Right ventricular systolic function is normal. The right ventricular  size is normal.   3. Left atrial size was mildly dilated. 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 The LAA emptying velocity was 60 cm/s.   4. There is a trivial pericardial effusion anterior to  the right  ventricle.   5. 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 Trivial mitral valve regurgitation.   6. The aortic valve is tricuspid. Aortic valve regurgitation is not  visualized.   7. 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   8. Agitated saline contrast bubble study was negative, with no evidence  of any interatrial shunt.   Conclusion(s)/Recommendation(s): Results discussed with Dr. Laural Benes.  Patient is in sinus rhythm at this time, but the presence of left atrial  appendage thrombus suggests significantly increased likelihood of  paroxysmal atrial fibrillation.  Anticoagulation indicated.        Brain MRI is reviewed in person and compared to the previous scan. The current MRI shows new acute embolic infarct involving more the anterior portal frontal lobes and also the right temporal lobe. Areas involve includes the corpus callosum on the left, the left basal ganglia and insular cortex on the right side.      Maylyn Narvaiz A. Gerilyn Morales, M.D.  Diplomate, Biomedical engineer of Psychiatry and Neurology ( Neurology). 05/25/2021, 6:48 PM

## 2021-05-25 NOTE — Progress Notes (Signed)
PROGRESS NOTE  Joshua Morales KZL:[id number] DOB: 1946-04-08 DOA: 05/24/2021 PCP: [facility]  Brief History:  75 year old male with a history of dementia, CKD stage IIIb, diabetes mellitus type 2, hypertension, macrocytic anemia, and stroke presenting with right arm weakness.  The patient is a poor historian secondary to his dementia.  Patient is obtained from review of the medical record and speaking with the patient's daughter who lives with the patient.  The patient's symptoms began on 05/22/2021 when he was noted to have difficulty swallowing pills and using his right hand.  Apparently the patient was also drooling on his right side.  On 05/23/2021, the patient continued to have difficulty using his right hand and had difficulty with his right lower extremity leaning to the right when he ambulated.  At baseline, the patient uses a cane and walker.  Daughter reports that the patient has had occasional coughing with eating. The patient was recently hospitalized from 05/05/2021 to 05/09/2021 for acute on chronic renal failure secondary to vomiting and diarrhea from his enterotoxigenic E. coli colitis.  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  In addition, he had a hospitalization from 04/05/2021 to 04/08/2021 for an acute ischemic stroke.  MRI at that time showed multiple punctate subacute/acute infarcts in the bilateral anterior circulation suggesting embolic event.  TEE showed an atrial thrombus and the patient was started on full anticoagulation with apixaban.  Daughter reports that the patient stools are more formed.   ED Course: Blood pressure systolic 160s to 390Z otherwise stable vitals.  Blood glucose 53.  Creatinine elevated 2.41.  WBC 10.6.  UA not suggestive of infection.  Head CT shows no acute abnormality, shows chronic infarcts. Hospitalist to admit for altered mental status, possibility of stroke.  Symptoms have been ongoing for about 2 days so code stroke  was not called.   Assessment/Plan: Acute embolic stroke -Neurology Consult -PT/OT evaluation -Speech therapy eval>> dysphagia 3 diet with thin liquids -CT brain--negative for acute findings -MRI brain--acute/subacute bilateral anterior circulation infarcts -MRA brain--no LVO -04/06/21 Carotid Duplex--no significant hemodynamic stenosis; patent vertebral -04/07/21 TEE--EF 60-65%, no WMA, +LAA thrombus 1x2cm -04/07/21-LDL--115 -04/05/2021 HbA1C--12.1 -Antiplatelet--apixaban  Acute on chronic renal failure--CKD stage IIIb -Baseline creatinine 1.8-2.1 -Improved with IV fluids -Serum creatinine peaked at 2.41  Uncontrolled diabetes mellitus type 2 with hyperglycemia -04/05/2021 hemoglobin A1c 12.1 -Continue NovoLog sliding scale  Hyperlipidemia -Continue statin -Repeat lipid panel  Atrial thrombus -Continue apixaban  Essential hypertension -Holding hydralazine and atenolol for permissive hypertension -Hydralazine--patient has not started it from last hospitalization  Dementia without behavioral disturbance -At risk for delirium -Restart Seroquel, memantine, donepezil  Moderate malnutrition -Continue nutritional supplements      Status is: Inpatient    Dispo: The patient is from: Home              Anticipated d/c is to: Home              Patient currently is not medically stable to d/c.   Difficult to place patient No        Family Communication:   daughter updated 05/25/21  Consultants:  neurology  Code Status:  FULL   DVT Prophylaxis:  apixaban   Procedures: As Listed in Progress Note Above  Antibiotics: None       Subjective: Patient denies fevers, chills, headache, chest pain, dyspnea, nausea, vomiting, diarrhea, abdominal pain   Objective: Vitals:   05/24/21 1630 05/24/21  1830 05/24/21 1942 05/25/21 0356  BP: (!) 189/80 (!) 165/147 (!) 188/76 (!) 165/88  Pulse: 62 62 61 70  Resp: 12 15 19 20   Temp:   98 F (36.7 C) 98.2 F (36.8 C)   TempSrc:      SpO2: 100% 100% 100% 100%  Weight:      Height:        Intake/Output Summary (Last 24 hours) at 05/25/2021 1115 Last data filed at 05/25/2021 0401 Gross per 24 hour  Intake 348.83 ml  Output --  Net 348.83 ml   Weight change:  Exam:  General:  Pt is alert, follows commands appropriately, not in acute distress HEENT: No icterus, No thrush, No neck mass, [place]/AT Cardiovascular: RRR, S1/S2, no rubs, no gallops Respiratory: poor inspiratory effort.  Bibasilar crackles Abdomen: Soft/+BS, non tender, non distended, no guarding Extremities: No edema, No lymphangitis, No petechiae, No rashes, no synovitis   Data Reviewed: I have personally reviewed following labs and imaging studies Basic Metabolic Panel: Recent Labs  Lab 05/24/21 1425 05/25/21 0455  NA 140 136  K 4.0 4.4  CL 109 109  CO2 19* 23  GLUCOSE 53* 308*  BUN 36* 32*  CREATININE 2.41* 2.10*  CALCIUM 9.6 8.7*   Liver Function Tests: Recent Labs  Lab 05/24/21 1425  AST 26  ALT 31  ALKPHOS 53  BILITOT 0.8  PROT 7.5  ALBUMIN 3.9   No results for input(s): LIPASE, AMYLASE in the last 168 hours. No results for input(s): AMMONIA in the last 168 hours. Coagulation Profile: No results for input(s): INR, PROTIME in the last 168 hours. CBC: Recent Labs  Lab 05/24/21 1425 05/25/21 0455  WBC 10.6* 8.0  NEUTROABS 5.6  --   HGB 10.4* 8.6*  HCT 32.4* 26.1*  MCV 103.5* 103.2*  PLT 260 204   Cardiac Enzymes: No results for input(s): CKTOTAL, CKMB, CKMBINDEX, TROPONINI in the last 168 hours. BNP: Invalid input(s): POCBNP CBG: Recent Labs  Lab 05/24/21 1856 05/24/21 2238 05/24/21 2356 05/25/21 0354 05/25/21 0722  GLUCAP 108* 86 131* 284* 191*   HbA1C: No results for input(s): HGBA1C in the last 72 hours. Urine analysis:    Component Value Date/Time   COLORURINE YELLOW 05/24/2021 1631   APPEARANCEUR CLEAR 05/24/2021 1631   LABSPEC 1.013 05/24/2021 1631   PHURINE 5.0 05/24/2021 1631    GLUCOSEU 50 (A) 05/24/2021 1631   HGBUR MODERATE (A) 05/24/2021 1631   BILIRUBINUR NEGATIVE 05/24/2021 1631   KETONESUR NEGATIVE 05/24/2021 1631   PROTEINUR 100 (A) 05/24/2021 1631   NITRITE NEGATIVE 05/24/2021 1631   LEUKOCYTESUR NEGATIVE 05/24/2021 1631   Sepsis Labs: @LABRCNTIP (procalcitonin:4,lacticidven:4) )No results found for this or any previous visit (from the past 240 hour(s)).   Scheduled Meds:  apixaban  2.5 mg Oral BID   atenolol  25 mg Oral Daily   hydrALAZINE  50 mg Oral Q8H   insulin aspart  0-6 Units Subcutaneous Q4H   Continuous Infusions:  sodium chloride 100 mL/hr at 05/25/21 0401    Procedures/Studies: CT Head Wo Contrast  Result Date: 05/24/2021 CLINICAL DATA:  Altered mental status. EXAM: CT HEAD WITHOUT CONTRAST TECHNIQUE: Contiguous axial images were obtained from the base of the skull through the vertex without intravenous contrast. COMPARISON:  April 05, 2021 FINDINGS: Brain: There is mild cerebral atrophy with widening of the extra-axial spaces and ventricular dilatation. There are areas of decreased attenuation within the white matter tracts of the supratentorial brain, consistent with microvascular disease changes. Chronic bilateral basal ganglia  lacunar infarcts are seen. Vascular: No hyperdense vessel or unexpected calcification. Skull: Normal. Negative for fracture or focal lesion. Sinuses/Orbits: No acute finding. Other: None. IMPRESSION: 1. Generalized cerebral atrophy. 2. Chronic bilateral basal ganglia lacunar infarcts. 3. No acute intracranial abnormality. Electronically Signed   By: Aram Candela M.D.   On: 05/24/2021 15:13    Catarina Hartshorn, DO  [facility]  If 7PM-7AM, please contact night-coverage [email or web address] Password [email or web address] 05/25/2021, 11:15 AM   LOS: 0 days

## 2021-05-25 NOTE — NC FL2 (Signed)
[place] MEDICAID FL2 LEVEL OF CARE SCREENING TOOL     IDENTIFICATION  Patient Name: Joshua Morales Birthdate: Jan 01, 1946 Sex: male Admission Date (Current Location): 05/24/2021  [facility] and [place] Number:  [employer] and Address:  [facility],  [address] [place] [address]      Provider Number: [id number]  Attending Physician Name and Address:  Catarina Hartshorn, MD  Relative Name and Phone Number:  gemini, beaumier (Daughter)   [phone]    Current Level of Care: Hospital Recommended Level of Care: Skilled Nursing Facility Prior Approval Number:    Date Approved/Denied:   PASRR Number: [id number]  Discharge Plan: SNF    Current Diagnoses: Patient Active Problem List   Diagnosis Date Noted   Acute ischemic stroke (HCC) 05/25/2021   Acute renal failure superimposed on stage 3b chronic kidney disease (HCC) 05/25/2021   Right sided weakness 05/24/2021   Enteritis, enterotoxigenic E. 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 04/05/2021   Dementia associated with alcoholism (HCC) 04/05/2021   Acute metabolic encephalopathy 04/05/2021    Orientation RESPIRATION BLADDER Height & Weight     Self  Normal   Weight: 123 lb (55.8 kg) Height:  6\' 1"  (185.4 cm)  BEHAVIORAL SYMPTOMS/MOOD NEUROLOGICAL BOWEL NUTRITION STATUS        Diet (DYS 3)  AMBULATORY STATUS COMMUNICATION OF NEEDS Skin   Limited Assist Verbally Normal                        Personal Care Assistance Level of Assistance  Bathing, Dressing, Feeding Bathing Assistance: Limited assistance Feeding assistance: Independent Dressing Assistance: Limited assistance     Functional Limitations Info  Sight, Hearing, Speech Sight Info: Adequate Hearing Info: Adequate Speech Info: Adequate    SPECIAL CARE FACTORS FREQUENCY  PT (By licensed PT)     PT Frequency: 5x/week              Contractures Contractures Info: Not present    Additional Factors Info  Code Status, Allergies Code Status Info: Full code Allergies Info: NKA Psychotropic Info: n/a         Current Medications (05/25/2021):  This is the current hospital active medication list Current Facility-Administered Medications  Medication Dose Route Frequency Provider Last Rate Last Admin   0.9 %  sodium chloride infusion   Intravenous Continuous Emokpae, Ejiroghene E, MD 100 mL/hr at 05/25/21 1202 New Bag at 05/25/21 1202   acetaminophen (TYLENOL) tablet 650 mg  650 mg Oral Q6H PRN Emokpae, Ejiroghene E, MD       Or   acetaminophen (TYLENOL) suppository 650 mg  650 mg Rectal Q6H PRN Emokpae, Ejiroghene E, MD       apixaban (ELIQUIS) tablet 2.5 mg  2.5 mg Oral BID Emokpae, Ejiroghene E, MD   2.5 mg at 05/25/21 0811   atenolol (TENORMIN) tablet 25 mg  25 mg Oral Daily Emokpae, Ejiroghene E, MD   25 mg at 05/25/21 0811   atorvastatin (LIPITOR) tablet 40 mg  40  mg Oral q1800 Tat, David, MD       donepezil (ARICEPT) tablet 5 mg  5 mg Oral QHS Tat, Onalee Hua, MD       folic acid (FOLVITE) tablet 1 mg  1 mg Oral Daily Tat, David, MD       hydrALAZINE (APRESOLINE) injection 10 mg  10 mg Intravenous Q4H PRN Emokpae, Ejiroghene E, MD       insulin aspart (novoLOG) injection 0-6 Units  0-6 Units Subcutaneous Q4H Tat, David, MD       memantine [facility]) tablet 5 mg  5 mg Oral BID Tat, David, MD       ondansetron [facility]) tablet 4 mg  4 mg Oral Q6H PRN Emokpae, Ejiroghene E, MD       Or   ondansetron (ZOFRAN)  injection 4 mg  4 mg Intravenous Q6H PRN Emokpae, Ejiroghene E, MD       polyethylene glycol (MIRALAX / GLYCOLAX) packet 17 g  17 g Oral Daily PRN Emokpae, Ejiroghene E, MD       QUEtiapine (SEROQUEL) tablet 25 mg  25 mg Oral QHS Catarina Hartshorn, MD         Discharge Medications: Please see discharge summary for a list of discharge medications.  Relevant Imaging Results:  Relevant Lab Results:   Additional Information SSN [id number] Juleen China, LCSW

## 2021-05-25 NOTE — Progress Notes (Signed)
Inpatient Diabetes Program Recommendations  AACE/ADA: New Consensus Statement on Inpatient Glycemic Control (2015)  Target Ranges:  Prepandial:   less than 140 mg/dL      Peak postprandial:   less than 180 mg/dL (1-2 hours)      Critically ill patients:  140 - 180 mg/dL   Lab Results  Component Value Date   GLUCAP 191 (H) 05/25/2021   HGBA1C 12.1 (H) 04/05/2021    Review of Glycemic Control Results for Joshua Morales, Joshua Morales (MRN [id number]) as of 05/25/2021 09:24  Ref. Range 05/24/2021 18:56 05/24/2021 22:38 05/24/2021 23:56 05/25/2021 03:54 05/25/2021 07:22  Glucose-Capillary Latest Ref Range: 70 - 99 mg/dL 542 (H) 86 706 (H) 237 (H) 191 (H)   Diabetes history: DM 2 Outpatient Diabetes medications:  Levemir 8 units daily, Novolog 2 units tid with meals  Current orders for Inpatient glycemic control:  Novolog sensitive q 4 hours  Inpatient Diabetes Program Recommendations:    Consider reducing Novolog correction to very sensitive (0-6 units) q 4 hours.    Thanks,  Beryl Meager, RN, BC-ADM Inpatient Diabetes Coordinator Pager [phone]  (8a-5p)

## 2021-05-25 NOTE — TOC Initial Note (Signed)
Transition of Care [facility]) - Initial/Assessment Note    Patient Details  Name: Joshua Morales MRN: [id number] Date of Birth: 08-18-1946  [facility]) CM/SW Contact:    Annice Needy, LCSW Phone Number: 05/25/2021, 4:00 PM  Clinical Narrative:                 Patient from home with daughter and ex-wife. Admitted for Right sided weakness. At baseline he ambulates unassisted and will not use a cane or walker. When in the community they use a wc if one is available because patient gets winded. [facility] the [facility] Patient is active with [facility] with RN, PT, OT, ST. Daughter requests rehab at this time. Referral sent to facility requested.   Expected Discharge Plan: Skilled Nursing Facility Barriers to Discharge: Continued Medical Work up   Patient Goals and CMS Choice Patient states their goals for this hospitalization and ongoing recovery are:: SNF   Choice offered to / list presented to : Adult Children  Expected Discharge Plan and Services Expected Discharge Plan: Skilled Nursing Facility     Post Acute Care Choice: Skilled Nursing Facility Living arrangements for the past 2 months: Single Family Home                                      Prior Living Arrangements/Services Living arrangements for the past 2 months: Single Family Home Lives with:: Adult Children Patient language and need for interpreter reviewed:: Yes        Need for Family Participation in Patient Care: Yes (Comment) Care giver support system in place?: Yes (comment) Current home services: Home OT, Home PT, Home RN Criminal Activity/Legal Involvement Pertinent to Current Situation/Hospitalization: No - Comment as needed  Activities of Daily Living Home Assistive Devices/Equipment: Cane (specify quad or straight) ADL Screening (condition at time of admission) Patient's cognitive ability adequate to safely complete daily activities?: Yes Is the patient deaf or have  difficulty hearing?: No Does the patient have difficulty seeing, even when wearing glasses/contacts?: No Does the patient have difficulty concentrating, remembering, or making decisions?: Yes Patient able to express need for assistance with ADLs?: Yes Does the patient have difficulty dressing or bathing?: Yes Independently performs ADLs?: No Communication: Independent Dressing (OT): Needs assistance Is this a change from baseline?: Pre-admission baseline Grooming: Needs assistance Is this a change from baseline?: Pre-admission baseline Feeding: Independent Bathing: Needs assistance Is this a change from baseline?: Pre-admission baseline Toileting: Independent with device (comment) In/Out Bed: Independent Walks in Home: Independent with device (comment) Does the patient have difficulty walking or climbing stairs?: Yes Weakness of Legs: Both Weakness of Arms/Hands: None  Permission Sought/Granted      Share Information with NAME: Dexter Signor, daughter           Emotional Assessment       Orientation: : Oriented to Place Alcohol / Substance Use: Not Applicable Psych Involvement: No (comment)  Admission diagnosis:  Weakness [R53.1] AMS (altered mental status) [R41.82] Acute ischemic stroke [facility]) [I63.9] Patient Active Problem List   Diagnosis Date Noted   Acute ischemic stroke (HCC) 05/25/2021   Acute renal failure superimposed on stage 3b chronic kidney disease (HCC) 05/25/2021   Right sided weakness 05/24/2021   Enteritis, enterotoxigenic E. 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 [place] [address] Phone: [phone] Fax: [phone]  [facility] [place], [place] - [address] [place] [address] Phone: [phone] Fax: [phone]     Social Determinants of Health (SDOH) Interventions    Readmission Risk Interventions No flowsheet data found.

## 2021-05-25 NOTE — Plan of Care (Signed)
  Problem: Acute Rehab PT Goals(only PT should resolve) Goal: Pt Will Go Supine/Side To Sit Outcome: Progressing Flowsheets (Taken 05/25/2021 1229) Pt will go Supine/Side to Sit:  with modified independence  Independently Goal: Patient Will Transfer Sit To/From Stand Outcome: Progressing Flowsheets (Taken 05/25/2021 1229) Patient will transfer sit to/from stand: with min guard assist Goal: Pt Will Transfer Bed To Chair/Chair To Bed Outcome: Progressing Flowsheets (Taken 05/25/2021 1229) Pt will Transfer Bed to Chair/Chair to Bed:  min guard assist  with min assist Goal: Pt Will Ambulate Outcome: Progressing Flowsheets (Taken 05/25/2021 1229) Pt will Ambulate:  75 feet  with minimal assist  with rolling walker  with min guard assist   12:30 PM, 05/25/21 [name], MPT Physical Therapist with [facility] 336 [phone] office [phone] mobile phone

## 2021-05-26 DIAGNOSIS — F1027 Alcohol dependence with alcohol-induced persisting dementia: Secondary | ICD-10-CM

## 2021-05-26 LAB — GLUCOSE, CAPILLARY
Glucose-Capillary: 140 mg/dL — ABNORMAL HIGH (ref 70–99)
Glucose-Capillary: 146 mg/dL — ABNORMAL HIGH (ref 70–99)
Glucose-Capillary: 150 mg/dL — ABNORMAL HIGH (ref 70–99)
Glucose-Capillary: 156 mg/dL — ABNORMAL HIGH (ref 70–99)
Glucose-Capillary: 173 mg/dL — ABNORMAL HIGH (ref 70–99)
Glucose-Capillary: 283 mg/dL — ABNORMAL HIGH (ref 70–99)
Glucose-Capillary: 306 mg/dL — ABNORMAL HIGH (ref 70–99)

## 2021-05-26 LAB — LIPID PANEL
Cholesterol: 107 mg/dL (ref 0–200)
HDL: 38 mg/dL — ABNORMAL LOW (ref >40)
LDL Cholesterol: 52 mg/dL (ref 0–99)
Total CHOL/HDL Ratio: 2.8 RATIO
Triglycerides: 85 mg/dL (ref <150)
VLDL: 17 mg/dL (ref 0–40)

## 2021-05-26 LAB — BASIC METABOLIC PANEL
Anion gap: 7 (ref 5–15)
BUN: 30 mg/dL — ABNORMAL HIGH (ref 8–23)
CO2: 22 mmol/L (ref 22–32)
Calcium: 8.5 mg/dL — ABNORMAL LOW (ref 8.9–10.3)
Chloride: 108 mmol/L (ref 98–111)
Creatinine, Ser: 1.95 mg/dL — ABNORMAL HIGH (ref 0.61–1.24)
GFR, Estimated: 36 mL/min — ABNORMAL LOW (ref >60)
Glucose, Bld: 161 mg/dL — ABNORMAL HIGH (ref 70–99)
Potassium: 4.2 mmol/L (ref 3.5–5.1)
Sodium: 137 mmol/L (ref 135–145)

## 2021-05-26 LAB — MAGNESIUM: Magnesium: 1.8 mg/dL (ref 1.7–2.4)

## 2021-05-26 MED ORDER — INSULIN ASPART 100 UNIT/ML IJ SOLN: 0.0000 [IU] | Freq: Every day | INTRAMUSCULAR | Status: DC

## 2021-05-26 MED ORDER — HYDRALAZINE HCL 50 MG PO TABS: 50.0000 mg | ORAL_TABLET | Freq: Two times a day (BID) | ORAL | Status: DC

## 2021-05-26 MED ORDER — HYDRALAZINE HCL 25 MG PO TABS
50.0000 mg | ORAL_TABLET | Freq: Two times a day (BID) | ORAL | Status: DC
  Administered 2021-05-26 – 2021-05-27 (×3): 50 mg via ORAL
  Filled 2021-05-26 (×3): qty 2

## 2021-05-26 MED ORDER — RIVAROXABAN 15 MG PO TABS: 15.0000 mg | ORAL_TABLET | Freq: Every day | ORAL | Status: DC

## 2021-05-26 MED ORDER — INSULIN ASPART 100 UNIT/ML IJ SOLN
0.0000 [IU] | Freq: Three times a day (TID) | INTRAMUSCULAR | Status: DC
  Administered 2021-05-26: 7 [IU] via SUBCUTANEOUS
  Administered 2021-05-26: 5 [IU] via SUBCUTANEOUS
  Administered 2021-05-27: 3 [IU] via SUBCUTANEOUS
  Administered 2021-05-27: 7 [IU] via SUBCUTANEOUS

## 2021-05-26 NOTE — TOC Progression Note (Signed)
Transition of Care [facility]) - Progression Note    Patient Details  Name: Joshua Morales MRN: [id number] Date of Birth: 08-03-1946  Transition of [facility]) CM/SW Contact  Annice Needy, LCSW Phone Number: 05/26/2021, 1:31 PM  Clinical Narrative:    Patient is fully vaccinated and boostered for  COVID. He received his COVID vaccination from the [facility]. [name] provided bed offers and advised that she would speak to her sister and contact TOC in return.    Expected Discharge Plan: Skilled Nursing Facility Barriers to Discharge: Continued Medical Work up  Expected Discharge Plan and Services Expected Discharge Plan: Skilled Nursing Facility     Post Acute Care Choice: Skilled Nursing Facility Living arrangements for the past 2 months: Single Family Home Expected Discharge Date: 05/26/21                                     Social Determinants of Health (SDOH) Interventions    Readmission Risk Interventions No flowsheet data found.

## 2021-05-26 NOTE — Progress Notes (Addendum)
Physical Therapy Treatment Patient Details Name: Joshua Morales MRN: [id number] DOB: [birth year] Today's Date: 05/26/2021   History of Present Illness FREEMAN BORBA is a 75 y.o. male with medical history significant for dementia, diabetes mellitus, hypertension, stroke.  Patient was brought to the ED via EMS reports of being slow, and weak.  History is limited from patient due to baseline dementia.  Daughter [name] who patient lives with, is present at bedside, and helps with the history.  Per reports symptoms started on Saturday,  2 days ago.  It was noted that he was unable to use his right hand to put on his lighter.  She also reports drooling from the right side of patient's mouth the next day Sunday, and patient was finding it hard to use his right lower extremity.  He was also leaning to the right when he tried to walk.  At baseline he uses a cane or walker most times to ambulate.  Daughter reports patient has not been able to swallow his pills.  But he ate yesterday and today.  She reports occasional coughing when patient is eating.  On talking to patient, he reports difficulty swallowing his pills only, but no difficulty swallowing.    PT Comments     Pt sitting in bed and agreeable to working with therapist today.  Pt required increased time to complete supine to sit and min assist from therapist to come to edge of bed with verbal cues.  Cues for UE placement and using UE to push up rather than pull up using RW to stand from edge of bed.  Pt with frequent VC's to take larger steps and focus gaze forward with ambulation rather than looking down and maintaining a forward flexed posture.  Pt returned to chair with alarm set.    Recommendations for follow up therapy are one component of a multi-disciplinary discharge planning process, led by the attending physician.  Recommendations may be updated based on patient status, additional functional criteria and insurance authorization.  Follow Up  Recommendations        Equipment Recommendations       Recommendations for Other Services       Precautions / Restrictions       Mobility  Bed Mobility Overal bed mobility: Needs Assistance Bed Mobility: Supine to Sit     Supine to sit: Supervision     General bed mobility comments: increased time, labored movement    Transfers Overall transfer level: Needs assistance Equipment used: Rolling walker (2 wheeled) Transfers: Sit to/from Stand Sit to Stand: Min guard         General transfer comment: cues for UE placement with standing  Ambulation/Gait Ambulation/Gait assistance: Min guard Gait Distance (Feet): 150 Feet Assistive device: Rolling walker (2 wheeled) Gait Pattern/deviations: Decreased step length - right;Decreased step length - left;Decreased stride length;Decreased dorsiflexion - right;Narrow base of support;Scissoring Gait velocity: decreased       Stairs             Wheelchair Mobility    Modified Rankin (Stroke Patients Only)       Balance                                            Cognition  Exercises      General Comments        Pertinent Vitals/Pain      Home Living                      Prior Function            PT Goals (current goals can now be found in the care plan section)      Frequency           PT Plan      Co-evaluation              AM-PAC PT "6 Clicks" Mobility   Outcome Measure  Help needed turning from your back to your side while in a flat bed without using bedrails?: None Help needed moving from lying on your back to sitting on the side of a flat bed without using bedrails?: None Help needed moving to and from a bed to a chair (including a wheelchair)?: A Little Help needed standing up from a chair using your arms (e.g., wheelchair or bedside chair)?: A Little Help needed to walk in  hospital room?: A Little Help needed climbing 3-5 steps with a railing? : A Lot 6 Click Score: 19    End of Session Equipment Utilized During Treatment: Gait belt Activity Tolerance: Patient tolerated treatment well;Patient limited by fatigue Patient left: in chair;with nursing/sitter in room;Other (comment) Nurse Communication: Mobility status PT Visit Diagnosis: Unsteadiness on feet (R26.81);Muscle weakness (generalized) (M62.81)     Time: 4235-3614 PT Time Calculation (min) (ACUTE ONLY): 15 min  Charges:  $Gait Training: 8-22 mins                     Lurena Nida, PTA/CLT [phone]    Bascom Levels, Lavarr President B 05/26/2021, 3:37 PM

## 2021-05-26 NOTE — Discharge Summary (Signed)
Physician Discharge Summary  MIKELL KAZLAUSKAS VQQ:[id number] DOB: 1945-11-09 DOA: 05/24/2021  PCP: [facility]  Admit date: 05/24/2021 Discharge date: 05/26/2021  Admitted From: Home Disposition:  SNF  Recommendations for Outpatient Follow-up:  Follow up with PCP in 1-2 weeks Please obtain BMP/CBC in one week   Discharge Condition: Stable CODE STATUS:FULL Diet recommendation: Heart Healthy / Carb Modified    Brief/Interim Summary: 75 year old male with a history of dementia, CKD stage IIIb, diabetes mellitus type 2, hypertension, macrocytic anemia, and stroke presenting with right arm weakness.  The patient is a poor historian secondary to his dementia.  Patient is obtained from review of the medical record and speaking with the patient's daughter who lives with the patient.  The patient's symptoms began on 05/22/2021 when he was noted to have difficulty swallowing pills and using his right hand.  Apparently the patient was also drooling on his right side.  On 05/23/2021, the patient continued to have difficulty using his right hand and had difficulty with his right lower extremity leaning to the right when he ambulated.  At baseline, the patient uses a cane and walker.  Daughter reports that the patient has had occasional coughing with eating. The patient was recently hospitalized from 05/05/2021 to 05/09/2021 for acute on chronic renal failure secondary to vomiting and diarrhea from his enterotoxigenic E. coli colitis.  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  In addition, he had a hospitalization from 04/05/2021 to 04/08/2021 for an acute ischemic stroke.  MRI at that time showed multiple punctate subacute/acute infarcts in the bilateral anterior circulation suggesting embolic event.  TEE showed an atrial thrombus and the patient was started on full anticoagulation with apixaban.  Daughter reports that the patient stools are more formed.     ED Course: Blood pressure  systolic 160s to 433I otherwise stable vitals.  Blood glucose 53.  Creatinine elevated 2.41.  WBC 10.6.  UA not suggestive of infection.  Head CT shows no acute abnormality, shows chronic infarcts. Hospitalist to admit for altered mental status, possibility of stroke.  Symptoms have been ongoing for about 2 days so code stroke was not called.    Discharge Diagnoses:  Acute embolic stroke -Neurology Consult noted>>switch apixaban to rivaroxaban -PT/OT evaluation>>SNF -Speech therapy eval>> dysphagia 3 diet with thin liquids -CT brain--negative for acute findings -MRI brain--acute/subacute bilateral anterior circulation infarcts -MRA brain--no LVO -04/06/21 Carotid Duplex--no significant hemodynamic stenosis; patent vertebral -04/07/21 TEE--EF 60-65%, no WMA, +LAA thrombus 1x2cm -04/07/21-LDL--115 -05/25/21 LDL--52 -04/05/2021 HbA1C--12.1 -Antiplatelet--rivaroxaban   Acute on chronic renal failure--CKD stage IIIb -Baseline creatinine 1.8-2.1 -Improved with IV fluids -Serum creatinine peaked at 2.41 -serum creatinine 1.95 at time of d/c   Uncontrolled diabetes mellitus type 2 with hyperglycemia -04/05/2021 hemoglobin A1c 12.1 -Continue NovoLog sliding scale   Hyperlipidemia -Continue statin -Repeat lipid panel   Atrial thrombus -Continue apixaban>>rivaroxaban per neurology   Essential hypertension -Holding hydralazine and atenolol for permissive hypertension -Hydralazine--patient has not started it from last hospitalization   Dementia without behavioral disturbance -At risk for delirium -Restart Seroquel, memantine, donepezil   Moderate malnutrition -Continue nutritional supplements      Discharge Instructions   Allergies as of 05/26/2021   No Known Allergies      Medication List     STOP taking these medications    apixaban 2.5 MG Tabs tablet Commonly known as: ELIQUIS       TAKE these medications    ACIDOPHILUS LACTOBACILLUS PO Take 2 capsules by mouth  in the  morning and at bedtime.   atenolol 25 MG tablet Commonly known as: TENORMIN Take 1 tablet (25 mg total) by mouth daily.   atorvastatin 40 MG tablet Commonly known as: LIPITOR Take 40 mg by mouth daily.   D3-1000 25 MCG (1000 UT) tablet Generic drug: Cholecalciferol Take 2,000 Units by mouth daily.   donepezil 5 MG tablet Commonly known as: ARICEPT Take 1 tablet (5 mg total) by mouth at bedtime.   folic acid 1 MG tablet Commonly known as: FOLVITE Take 1 tablet (1 mg total) by mouth daily.   hydrALAZINE 50 MG tablet Commonly known as: APRESOLINE Take 1 tablet (50 mg total) by mouth every 12 (twelve) hours. What changed: when to take this   hydrOXYzine 10 MG tablet Commonly known as: ATARAX/VISTARIL Take 10 mg by mouth 2 (two) times daily as needed for itching.   insulin aspart 100 UNIT/ML FlexPen Commonly known as: NOVOLOG Inject 2 Units into the skin 3 (three) times daily with meals. IF EATS 50% OR MORE OF MEAL. What changed: how much to take   insulin detemir 100 UNIT/ML FlexPen Commonly known as: LEVEMIR Inject 8 Units into the skin daily.   Insulin Pen Needle 31G X 5 MM Misc 1 Device by Does not apply route as directed.   memantine 5 MG tablet Commonly known as: NAMENDA Take 1 tablet (5 mg total) by mouth 2 (two) times daily.   mirtazapine 15 MG tablet Commonly known as: REMERON Take 15 mg by mouth at bedtime. What changed: Another medication with the same name was removed. Continue taking this medication, and follow the directions you see here.   multivitamin with minerals Tabs tablet Take 1 tablet by mouth daily.   polyethylene glycol 17 g packet Commonly known as: MIRALAX / GLYCOLAX Take 17 g by mouth daily as needed for mild constipation.   QUEtiapine 25 MG tablet Commonly known as: SEROQUEL Take 1 tablet (25 mg total) by mouth at bedtime.   Rivaroxaban 15 MG Tabs tablet Commonly known as: XARELTO Take 1 tablet (15 mg total) by mouth daily with  supper.   thiamine 100 MG tablet Take 1 tablet (100 mg total) by mouth daily.        No Known Allergies  Consultations: neurology   Procedures/Studies: CT Head Wo Contrast  Result Date: 05/24/2021 CLINICAL DATA:  Altered mental status. EXAM: CT HEAD WITHOUT CONTRAST TECHNIQUE: Contiguous axial images were obtained from the base of the skull through the vertex without intravenous contrast. COMPARISON:  April 05, 2021 FINDINGS: Brain: There is mild cerebral atrophy with widening of the extra-axial spaces and ventricular dilatation. There are areas of decreased attenuation within the white matter tracts of the supratentorial brain, consistent with microvascular disease changes. Chronic bilateral basal ganglia lacunar infarcts are seen. Vascular: No hyperdense vessel or unexpected calcification. Skull: Normal. Negative for fracture or focal lesion. Sinuses/Orbits: No acute finding. Other: None. IMPRESSION: 1. Generalized cerebral atrophy. 2. Chronic bilateral basal ganglia lacunar infarcts. 3. No acute intracranial abnormality. Electronically Signed   By: Aram Candela M.D.   On: 05/24/2021 15:13   MR ANGIO HEAD WO CONTRAST  Result Date: 05/25/2021 CLINICAL DATA:  Right-sided weakness EXAM: MRI HEAD WITHOUT CONTRAST MRA HEAD WITHOUT CONTRAST TECHNIQUE: Multiplanar, multi-echo pulse sequences of the brain and surrounding structures were acquired without intravenous contrast. Angiographic images of the Circle of Willis were acquired using MRA technique without intravenous contrast. COMPARISON:  No pertinent prior exam. FINDINGS: MRI HEAD Brain: New foci  of variably reduced diffusion are present including involvement of the anterior left corpus callosum, posterior right corpus callosum, roof of the right temporal horn, inferior left basal ganglia, and right temporal subcortical white matter. Prominence of the ventricles and sulci reflects stable parenchymal volume loss. Patchy and confluent T2  hyperintensity in the supratentorial white matter is nonspecific but probably reflects stable chronic microvascular ischemic changes. No mass effect. No extra-axial collection. Vascular: Major vessel flow voids at the skull base are preserved. Skull and upper cervical spine: Normal marrow signal is preserved. Sinuses/Orbits: Paranasal sinuses are aerated. Orbits are unremarkable. Other: Sella is unremarkable.  Mastoid air cells are clear. MRA HEAD Motion artifact is present. Intracranial internal carotid arteries are patent. Apparent inferomedially directed outpouching from the right paraclinoid ICA is not confirmed on the prior study. Middle and anterior cerebral arteries are patent. Intracranial vertebral arteries, basilar artery, posterior cerebral arteries are patent. Appearance is similar to prior study. IMPRESSION: New small acute and subacute infarcts again involving the anterior circulation bilaterally. No new proximal intracranial vessel occlusion. Electronically Signed   By: Guadlupe Spanish M.D.   On: 05/25/2021 11:52   MR BRAIN WO CONTRAST  Result Date: 05/25/2021 CLINICAL DATA:  Right-sided weakness EXAM: MRI HEAD WITHOUT CONTRAST MRA HEAD WITHOUT CONTRAST TECHNIQUE: Multiplanar, multi-echo pulse sequences of the brain and surrounding structures were acquired without intravenous contrast. Angiographic images of the Circle of Willis were acquired using MRA technique without intravenous contrast. COMPARISON:  No pertinent prior exam. FINDINGS: MRI HEAD Brain: New foci of variably reduced diffusion are present including involvement of the anterior left corpus callosum, posterior right corpus callosum, roof of the right temporal horn, inferior left basal ganglia, and right temporal subcortical white matter. Prominence of the ventricles and sulci reflects stable parenchymal volume loss. Patchy and confluent T2 hyperintensity in the supratentorial white matter is nonspecific but probably reflects stable  chronic microvascular ischemic changes. No mass effect. No extra-axial collection. Vascular: Major vessel flow voids at the skull base are preserved. Skull and upper cervical spine: Normal marrow signal is preserved. Sinuses/Orbits: Paranasal sinuses are aerated. Orbits are unremarkable. Other: Sella is unremarkable.  Mastoid air cells are clear. MRA HEAD Motion artifact is present. Intracranial internal carotid arteries are patent. Apparent inferomedially directed outpouching from the right paraclinoid ICA is not confirmed on the prior study. Middle and anterior cerebral arteries are patent. Intracranial vertebral arteries, basilar artery, posterior cerebral arteries are patent. Appearance is similar to prior study. IMPRESSION: New small acute and subacute infarcts again involving the anterior circulation bilaterally. No new proximal intracranial vessel occlusion. Electronically Signed   By: Guadlupe Spanish M.D.   On: 05/25/2021 11:52        Discharge Exam: Vitals:   05/25/21 2013 05/26/21 0406  BP: (!) 157/79 (!) 159/85  Pulse: 66 66  Resp: 19 19  Temp: 98.6 F (37 C) 98 F (36.7 C)  SpO2: 100% 100%   Vitals:   05/25/21 0356 05/25/21 1406 05/25/21 2013 05/26/21 0406  BP: (!) 165/88 (!) 157/73 (!) 157/79 (!) 159/85  Pulse: 70 60 66 66  Resp: Temp: 98.2 F (36.8 C) 97.7 F (36.5 C) 98.6 F (37 C) 98 F (36.7 C)  TempSrc:  Oral Oral   SpO2: 100% 100% 100% 100%  Weight:      Height:        General: Pt is alert, awake, not in acute distress Cardiovascular: RRR, S1/S2 +, no rubs, no gallops Respiratory: diminished  BS.  Bibasilar rales.  Poor inspiratory effort Abdominal: Soft, NT, ND, bowel sounds + Extremities: no edema, no cyanosis   The results of significant diagnostics from this hospitalization (including imaging, microbiology, ancillary and laboratory) are listed below for reference.    Significant Diagnostic Studies: CT Head Wo Contrast  Result Date:  05/24/2021 CLINICAL DATA:  Altered mental status. EXAM: CT HEAD WITHOUT CONTRAST TECHNIQUE: Contiguous axial images were obtained from the base of the skull through the vertex without intravenous contrast. COMPARISON:  April 05, 2021 FINDINGS: Brain: There is mild cerebral atrophy with widening of the extra-axial spaces and ventricular dilatation. There are areas of decreased attenuation within the white matter tracts of the supratentorial brain, consistent with microvascular disease changes. Chronic bilateral basal ganglia lacunar infarcts are seen. Vascular: No hyperdense vessel or unexpected calcification. Skull: Normal. Negative for fracture or focal lesion. Sinuses/Orbits: No acute finding. Other: None. IMPRESSION: 1. Generalized cerebral atrophy. 2. Chronic bilateral basal ganglia lacunar infarcts. 3. No acute intracranial abnormality. Electronically Signed   By: Aram Candela M.D.   On: 05/24/2021 15:13   MR ANGIO HEAD WO CONTRAST  Result Date: 05/25/2021 CLINICAL DATA:  Right-sided weakness EXAM: MRI HEAD WITHOUT CONTRAST MRA HEAD WITHOUT CONTRAST TECHNIQUE: Multiplanar, multi-echo pulse sequences of the brain and surrounding structures were acquired without intravenous contrast. Angiographic images of the Circle of Willis were acquired using MRA technique without intravenous contrast. COMPARISON:  No pertinent prior exam. FINDINGS: MRI HEAD Brain: New foci of variably reduced diffusion are present including involvement of the anterior left corpus callosum, posterior right corpus callosum, roof of the right temporal horn, inferior left basal ganglia, and right temporal subcortical white matter. Prominence of the ventricles and sulci reflects stable parenchymal volume loss. Patchy and confluent T2 hyperintensity in the supratentorial white matter is nonspecific but probably reflects stable chronic microvascular ischemic changes. No mass effect. No extra-axial collection. Vascular: Major vessel flow  voids at the skull base are preserved. Skull and upper cervical spine: Normal marrow signal is preserved. Sinuses/Orbits: Paranasal sinuses are aerated. Orbits are unremarkable. Other: Sella is unremarkable.  Mastoid air cells are clear. MRA HEAD Motion artifact is present. Intracranial internal carotid arteries are patent. Apparent inferomedially directed outpouching from the right paraclinoid ICA is not confirmed on the prior study. Middle and anterior cerebral arteries are patent. Intracranial vertebral arteries, basilar artery, posterior cerebral arteries are patent. Appearance is similar to prior study. IMPRESSION: New small acute and subacute infarcts again involving the anterior circulation bilaterally. No new proximal intracranial vessel occlusion. Electronically Signed   By: Guadlupe Spanish M.D.   On: 05/25/2021 11:52   MR BRAIN WO CONTRAST  Result Date: 05/25/2021 CLINICAL DATA:  Right-sided weakness EXAM: MRI HEAD WITHOUT CONTRAST MRA HEAD WITHOUT CONTRAST TECHNIQUE: Multiplanar, multi-echo pulse sequences of the brain and surrounding structures were acquired without intravenous contrast. Angiographic images of the Circle of Willis were acquired using MRA technique without intravenous contrast. COMPARISON:  No pertinent prior exam. FINDINGS: MRI HEAD Brain: New foci of variably reduced diffusion are present including involvement of the anterior left corpus callosum, posterior right corpus callosum, roof of the right temporal horn, inferior left basal ganglia, and right temporal subcortical white matter. Prominence of the ventricles and sulci reflects stable parenchymal volume loss. Patchy and confluent T2 hyperintensity in the supratentorial white matter is nonspecific but probably reflects stable chronic microvascular ischemic changes. No mass effect. No extra-axial collection. Vascular: Major vessel flow voids at the skull base are preserved. Skull and  upper cervical spine: Normal marrow signal is  preserved. Sinuses/Orbits: Paranasal sinuses are aerated. Orbits are unremarkable. Other: Sella is unremarkable.  Mastoid air cells are clear. MRA HEAD Motion artifact is present. Intracranial internal carotid arteries are patent. Apparent inferomedially directed outpouching from the right paraclinoid ICA is not confirmed on the prior study. Middle and anterior cerebral arteries are patent. Intracranial vertebral arteries, basilar artery, posterior cerebral arteries are patent. Appearance is similar to prior study. IMPRESSION: New small acute and subacute infarcts again involving the anterior circulation bilaterally. No new proximal intracranial vessel occlusion. Electronically Signed   By: Guadlupe Spanish M.D.   On: 05/25/2021 11:52    Microbiology: No results found for this or any previous visit (from the past 240 hour(s)).   Labs: Basic Metabolic Panel: Recent Labs  Lab 05/24/21 1425 05/25/21 0455 05/26/21 0451  NA 140 136 137  K 4.0 4.4 4.2  CL 109 109 108  CO2 19* 23 22  GLUCOSE 53* 308* 161*  BUN 36* 32* 30*  CREATININE 2.41* 2.10* 1.95*  CALCIUM 9.6 8.7* 8.5*  MG  --   --  1.8   Liver Function Tests: Recent Labs  Lab 05/24/21 1425  AST 26  ALT 31  ALKPHOS 53  BILITOT 0.8  PROT 7.5  ALBUMIN 3.9   No results for input(s): LIPASE, AMYLASE in the last 168 hours. No results for input(s): AMMONIA in the last 168 hours. CBC: Recent Labs  Lab 05/24/21 1425 05/25/21 0455  WBC 10.6* 8.0  NEUTROABS 5.6  --   HGB 10.4* 8.6*  HCT 32.4* 26.1*  MCV 103.5* 103.2*  PLT 260 204   Cardiac Enzymes: No results for input(s): CKTOTAL, CKMB, CKMBINDEX, TROPONINI in the last 168 hours. BNP: Invalid input(s): POCBNP CBG: Recent Labs  Lab 05/26/21 0053 05/26/21 0221 05/26/21 0522 05/26/21 0717 05/26/21 1136  GLUCAP 156* 140* 173* 146* 306*    Time coordinating discharge:  36 minutes  Signed:  Catarina Hartshorn, DO [facility] Pager: [phone] 05/26/2021, 12:08 PM

## 2021-05-26 NOTE — TOC Progression Note (Signed)
Transition of Care [facility]) - Progression Note    Patient Details  Name: Joshua Morales MRN: [id number] Date of Birth: 12/01/1945  [facility] CM/SW Contact  Annice Needy, LCSW Phone Number: 05/26/2021, 10:38 AM  Clinical Narrative:    Patient declined by facility daughter, Ms. Crossley, requested. Ms. Kinnan advised that patient is a [place] patient and his placement would have to go through the [place]. He is a patient of Dr. Dimas Aguas at the [facility], [facility] [phone]. Spoke with Nurse, Izora Gala, who advised that [facility] social worker, Claudette Laws, [phone], [phone], would assist with placement. TOC left a message [facility] Laws with [facility] to discuss [facility]. Dorene also sent patient a chat requesting contact.    Expected Discharge Plan: Skilled Nursing Facility Barriers to Discharge: Continued Medical Work up  Expected Discharge Plan and Services Expected Discharge Plan: Skilled Nursing Facility     Post Acute Care Choice: Skilled Nursing Facility Living arrangements for the past 2 months: Single Family Home                                       Social Determinants of Health (SDOH) Interventions    Readmission Risk Interventions No flowsheet data found.

## 2021-05-26 NOTE — TOC Progression Note (Signed)
Transition of Care [facility]) - Progression Note    Patient Details  Name: Joshua Morales MRN: [id number] Date of Birth: Aug 11, 1946  Transition of Care [facility]) CM/SW Contact  Annice Needy, LCSW Phone Number: 05/26/2021, 3:40 PM  Clinical Narrative:    Daughter, [name], selects [facility]. Elease Hashimoto at [facility] notified and business office at [facility] started [employer].   Expected Discharge Plan: Skilled Nursing Facility Barriers to Discharge: Continued Medical Work up  Expected Discharge Plan and Services Expected Discharge Plan: Skilled Nursing Facility     Post Acute Care Choice: Skilled Nursing Facility Living arrangements for the past 2 months: Single Family Home Expected Discharge Date: 05/26/21                                     Social Determinants of Health (SDOH) Interventions    Readmission Risk Interventions No flowsheet data found.

## 2021-05-27 LAB — GLUCOSE, CAPILLARY
Glucose-Capillary: 246 mg/dL — ABNORMAL HIGH (ref 70–99)
Glucose-Capillary: 335 mg/dL — ABNORMAL HIGH (ref 70–99)

## 2021-05-27 MED ORDER — HYDRALAZINE HCL 50 MG PO TABS: 50.0000 mg | ORAL_TABLET | Freq: Three times a day (TID) | ORAL | 5 refills | Status: DC

## 2021-05-27 MED ORDER — RIVAROXABAN 15 MG PO TABS: 15.0000 mg | ORAL_TABLET | Freq: Every day | ORAL | 5 refills | Status: DC

## 2021-05-27 NOTE — Discharge Instructions (Signed)
1)You are taking rivaroxaban/Xarelto which is a blood thinner so please Avoid ibuprofen/Advil/Aleve/Motrin/Goody Powders/Naproxen/BC powders/Meloxicam/Diclofenac/Indomethacin and other Nonsteroidal anti-inflammatory medications as these will make you more likely to bleed and can cause stomach ulcers, can also cause Kidney problems.   2)[facility]within a week for recheck and reevaluation  3)Stop Eliquis/apixaban, start rivaroxaban /Xarelto instead of Eliquis for stroke prevention

## 2021-05-27 NOTE — Progress Notes (Signed)
Pt has discharge orders. Discharge teaching given to pts wife bedside, no further questions at this time. Pt wheeled down to vehicle accompanied by wife.

## 2021-05-27 NOTE — Discharge Summary (Signed)
Joshua Morales, is a 75 y.o. male  DOB Dec 16, 1945  MRN [id number].  Admission date:  05/24/2021  Admitting Physician  Catarina Hartshorn, MD  Discharge Date:  05/27/2021   Primary MD  Center, [facility]  Recommendations for primary care physician for things to follow:   1)You are taking rivaroxaban/Xarelto which is a blood thinner so please Avoid ibuprofen/Advil/Aleve/Motrin/Goody Powders/Naproxen/BC powders/Meloxicam/Diclofenac/Indomethacin and other Nonsteroidal anti-inflammatory medications as these will make you more likely to bleed and can cause stomach ulcers, can also cause Kidney problems.   2)follow up with  [facility]--within a week for recheck and reevaluation  3)Stop Eliquis/apixaban, start rivaroxaban /Xarelto instead of Eliquis for stroke prevention   Admission Diagnosis  Weakness [R53.1] AMS (altered mental status) [R41.82] Acute ischemic stroke (HCC) [I63.9]   Discharge Diagnosis  Weakness [R53.1] AMS (altered mental status) [R41.82] Acute ischemic stroke (HCC) [I63.9]    Principal Problem:   Right sided weakness Active Problems:   Diabetes mellitus type 2, uncontrolled (HCC)   Dementia associated with alcoholism (HCC)   AKI (acute kidney injury) (HCC)   Stroke (cerebrum) (HCC)   Atrial thrombus   CRD (chronic renal disease), stage 3b (HCC)   Acute ischemic stroke (HCC)   Acute renal failure superimposed on stage 3b chronic kidney disease (HCC)      Past Medical History:  Diagnosis Date   Agent orange exposure    Atrial thrombus 05/05/2021   CRD (chronic renal disease), stage 3b (HCC) 05/05/2021   Dementia (HCC)    Diabetes mellitus without complication (HCC)    Hypertension    Macrocytic anemia 05/05/2021    Past Surgical History:  Procedure Laterality Date   BUBBLE STUDY  04/07/2021   Procedure: BUBBLE STUDY;  Surgeon: Jonelle Sidle, MD;  Location:  [facility];  Service: Cardiovascular;;  with TEE   LUNG REMOVAL, PARTIAL     TEE WITHOUT CARDIOVERSION N/A 04/07/2021   Procedure: TRANSESOPHAGEAL ECHOCARDIOGRAM (TEE);  Surgeon: Jonelle Sidle, MD;  Location: [facility];  Service: Cardiovascular;  Laterality: N/A;   TOE AMPUTATION       HPI  from the history and physical done on the day of admission:   Chief Complaint: Right side weakness   HPI: Joshua Morales is a 76 y.o. male with medical history significant for dementia, diabetes mellitus, hypertension, stroke. Patient was brought to the ED via EMS reports of being slow, and weak.  History is limited from patient due to baseline dementia.  Daughter [name] who patient lives with, is present at bedside, and helps with the history. Per reports symptoms started on Saturday,  2 days ago.  It was noted that he was unable to use his right hand to put on his lighter.  She also reports drooling from the right side of patient's mouth the next day Sunday, and patient was finding it hard to use his right lower extremity.  He was also leaning to the right when he tried to walk.  At baseline  he uses a cane or walker most times to ambulate. Daughter reports patient has not been able to swallow his pills.  But he ate yesterday and today.  She reports occasional coughing when patient is eating.  On talking to patient, he reports difficulty swallowing his pills only, but no difficulty swallowing.   Recent hospitalizations 9/1- 9/4 for AKI, secondary to diarrhea, also diagnosed with enterotoxigenic E. coli as etiology for diarrhea.  Also though to be Colonized with C. Difficile.  8/1- 8/4 for acute CVA with multiple punctate acute/subacute infarcts in the bilateral anterior circulation suggesting embolic event, TEE showed atrial thrombus so he was started on full anticoagulation with apixaban.   Daughter reports his stools are becoming more formed, he had about 4 bowel movements yesterday, none today so far.    ED Course: Blood pressure systolic 160s to 782N otherwise stable vitals.  Blood glucose 53.  Creatinine elevated 2.41.  WBC 10.6.  UA not suggestive of infection.  Head CT shows no acute abnormality, shows chronic infarcts. Hospitalist to admit for altered mental status, possibility of stroke.  Symptoms have been ongoing for about 2 days so code stroke was not called.   Review of Systems: As per HPI all other systems reviewed and negative       Hospital Course:    Acute embolic stroke -Neurology Consult Appreciated, recommended switching- apixaban to rivaroxaban -PT/OT evaluation--Appreciated and recommended transfer to SNF -Speech therapy eval>> dysphagia 3 diet with thin liquids -CT brain--negative for acute findings -MRI brain--acute/subacute bilateral anterior circulation infarcts -MRA brain--no LVO -04/06/21 Carotid Duplex--no significant hemodynamic stenosis; patent vertebral -04/07/21 TEE--EF 60-65%, no WMA, +LAA thrombus 1x2cm -04/07/21-LDL--115 -05/25/21 LDL--52 -04/05/2021 HbA1C--12.1   Acute on chronic renal failure--CKD stage IIIb -Baseline creatinine 1.8-2.1 -Improved with IV fluids -Serum creatinine peaked at 2.41   Uncontrolled diabetes mellitus type 2 with hyperglycemia -04/05/2021 hemoglobin A1c 12.1 -Continue NovoLog sliding scale   Hyperlipidemia -Continue statin   Atrial thrombus -Continue apixaban>>rivaroxaban per neurology   Essential hypertension Restart hydralazine and atenolol   Dementia without behavioral disturbance -At risk for delirium -Restart Seroquel, memantine, donepezil   Moderate malnutrition -Continue nutritional supplements  -  Discharge Condition: stable  Follow UP   Contact information for after-discharge care     Destination     [facility] Preferred SNF .   Service: Skilled Nursing Contact information: [address] [place] [address] [phone]                       Consults obtained - Neurology  Diet and Activity recommendation:  As advised  Discharge Instructions   Discharge Instructions     Call MD for:  difficulty breathing, headache or visual disturbances   Complete by: As directed    Call MD for:  persistant dizziness or light-headedness   Complete by: As directed    Call MD for:  persistant nausea and vomiting   Complete by: As directed    Call MD for:  severe uncontrolled pain   Complete by: As directed    Call MD for:  temperature >100.4   Complete by: As directed    Diet - low sodium heart healthy   Complete by: As directed    Discharge instructions   Complete by: As directed    1)You are taking rivaroxaban/Xarelto which is a blood thinner so please Avoid ibuprofen/Advil/Aleve/Motrin/Goody Powders/Naproxen/BC powders/Meloxicam/Diclofenac/Indomethacin and other Nonsteroidal anti-inflammatory medications as these will make you  more likely to bleed and can cause stomach ulcers, can also cause Kidney problems.   2)[facility]--within a week for recheck and reevaluation  3)Stop Eliquis/apixaban, start rivaroxaban /Xarelto instead of Eliquis for stroke prevention   Increase activity slowly   Complete by: As directed        Discharge Medications     Allergies as of 05/27/2021   No Known Allergies      Medication List     STOP taking these medications    apixaban 2.5 MG Tabs tablet Commonly known as: ELIQUIS       TAKE these medications    ACIDOPHILUS LACTOBACILLUS PO Take 2 capsules by mouth in the morning and at bedtime.   atenolol 25 MG tablet Commonly known as: TENORMIN Take 1 tablet (25 mg total) by mouth daily.   atorvastatin 40 MG tablet Commonly known as: LIPITOR Take 40 mg by mouth daily.   D3-1000 25 MCG (1000 UT) tablet Generic drug: Cholecalciferol Take 2,000 Units by mouth daily.   donepezil 5 MG tablet Commonly known as: ARICEPT Take 1 tablet (5  mg total) by mouth at bedtime.   folic acid 1 MG tablet Commonly known as: FOLVITE Take 1 tablet (1 mg total) by mouth daily.   hydrALAZINE 50 MG tablet Commonly known as: APRESOLINE Take 1 tablet (50 mg total) by mouth 3 (three) times daily. What changed: when to take this   hydrOXYzine 10 MG tablet Commonly known as: ATARAX/VISTARIL Take 10 mg by mouth 2 (two) times daily as needed for itching.   insulin aspart 100 UNIT/ML FlexPen Commonly known as: NOVOLOG Inject 2 Units into the skin 3 (three) times daily with meals. IF EATS 50% OR MORE OF MEAL. What changed: how much to take   insulin detemir 100 UNIT/ML FlexPen Commonly known as: LEVEMIR Inject 8 Units into the skin daily.   Insulin Pen Needle 31G X 5 MM Misc 1 Device by Does not apply route as directed.   memantine 5 MG tablet Commonly known as: NAMENDA Take 1 tablet (5 mg total) by mouth 2 (two) times daily.   mirtazapine 15 MG tablet Commonly known as: REMERON Take 15 mg by mouth at bedtime. What changed: Another medication with the same name was removed. Continue taking this medication, and follow the directions you see here.   multivitamin with minerals Tabs tablet Take 1 tablet by mouth daily.   polyethylene glycol 17 g packet Commonly known as: MIRALAX / GLYCOLAX Take 17 g by mouth daily as needed for mild constipation.   QUEtiapine 25 MG tablet Commonly known as: SEROQUEL Take 1 tablet (25 mg total) by mouth at bedtime.   Rivaroxaban 15 MG Tabs tablet Commonly known as: XARELTO Take 1 tablet (15 mg total) by mouth daily with supper.   thiamine 100 MG tablet Take 1 tablet (100 mg total) by mouth daily.        Major procedures and Radiology Reports - PLEASE review detailed and final reports for all details, in brief -   CT Head Wo Contrast  Result Date: 05/24/2021 CLINICAL DATA:  Altered mental status. EXAM: CT HEAD WITHOUT CONTRAST TECHNIQUE: Contiguous axial images were obtained from the  base of the skull through the vertex without intravenous contrast. COMPARISON:  April 05, 2021 FINDINGS: Brain: There is mild cerebral atrophy with widening of the extra-axial spaces and ventricular dilatation. There are areas of decreased attenuation within the white matter tracts of the  supratentorial brain, consistent with microvascular disease changes. Chronic bilateral basal ganglia lacunar infarcts are seen. Vascular: No hyperdense vessel or unexpected calcification. Skull: Normal. Negative for fracture or focal lesion. Sinuses/Orbits: No acute finding. Other: None. IMPRESSION: 1. Generalized cerebral atrophy. 2. Chronic bilateral basal ganglia lacunar infarcts. 3. No acute intracranial abnormality. Electronically Signed   By: Aram Candela M.D.   On: 05/24/2021 15:13   MR ANGIO HEAD WO CONTRAST  Result Date: 05/25/2021 CLINICAL DATA:  Right-sided weakness EXAM: MRI HEAD WITHOUT CONTRAST MRA HEAD WITHOUT CONTRAST TECHNIQUE: Multiplanar, multi-echo pulse sequences of the brain and surrounding structures were acquired without intravenous contrast. Angiographic images of the Circle of Willis were acquired using MRA technique without intravenous contrast. COMPARISON:  No pertinent prior exam. FINDINGS: MRI HEAD Brain: New foci of variably reduced diffusion are present including involvement of the anterior left corpus callosum, posterior right corpus callosum, roof of the right temporal horn, inferior left basal ganglia, and right temporal subcortical white matter. Prominence of the ventricles and sulci reflects stable parenchymal volume loss. Patchy and confluent T2 hyperintensity in the supratentorial white matter is nonspecific but probably reflects stable chronic microvascular ischemic changes. No mass effect. No extra-axial collection. Vascular: Major vessel flow voids at the skull base are preserved. Skull and upper cervical spine: Normal marrow signal is preserved. Sinuses/Orbits: Paranasal sinuses  are aerated. Orbits are unremarkable. Other: Sella is unremarkable.  Mastoid air cells are clear. MRA HEAD Motion artifact is present. Intracranial internal carotid arteries are patent. Apparent inferomedially directed outpouching from the right paraclinoid ICA is not confirmed on the prior study. Middle and anterior cerebral arteries are patent. Intracranial vertebral arteries, basilar artery, posterior cerebral arteries are patent. Appearance is similar to prior study. IMPRESSION: New small acute and subacute infarcts again involving the anterior circulation bilaterally. No new proximal intracranial vessel occlusion. Electronically Signed   By: Guadlupe Spanish M.D.   On: 05/25/2021 11:52   MR BRAIN WO CONTRAST  Result Date: 05/25/2021 CLINICAL DATA:  Right-sided weakness EXAM: MRI HEAD WITHOUT CONTRAST MRA HEAD WITHOUT CONTRAST TECHNIQUE: Multiplanar, multi-echo pulse sequences of the brain and surrounding structures were acquired without intravenous contrast. Angiographic images of the Circle of Willis were acquired using MRA technique without intravenous contrast. COMPARISON:  No pertinent prior exam. FINDINGS: MRI HEAD Brain: New foci of variably reduced diffusion are present including involvement of the anterior left corpus callosum, posterior right corpus callosum, roof of the right temporal horn, inferior left basal ganglia, and right temporal subcortical white matter. Prominence of the ventricles and sulci reflects stable parenchymal volume loss. Patchy and confluent T2 hyperintensity in the supratentorial white matter is nonspecific but probably reflects stable chronic microvascular ischemic changes. No mass effect. No extra-axial collection. Vascular: Major vessel flow voids at the skull base are preserved. Skull and upper cervical spine: Normal marrow signal is preserved. Sinuses/Orbits: Paranasal sinuses are aerated. Orbits are unremarkable. Other: Sella is unremarkable.  Mastoid air cells are clear.  MRA HEAD Motion artifact is present. Intracranial internal carotid arteries are patent. Apparent inferomedially directed outpouching from the right paraclinoid ICA is not confirmed on the prior study. Middle and anterior cerebral arteries are patent. Intracranial vertebral arteries, basilar artery, posterior cerebral arteries are patent. Appearance is similar to prior study. IMPRESSION: New small acute and subacute infarcts again involving the anterior circulation bilaterally. No new proximal intracranial vessel occlusion. Electronically Signed   By: Guadlupe Spanish M.D.   On: 05/25/2021 11:52    Micro Results   No  results found for this or any previous visit (from the past 240 hour(s)).  Today   Subjective    [place] today has no new concerns  No fever  Or chills   No Nausea, Vomiting or Diarrhea      Patient has been seen and examined prior to discharge   Objective   Blood pressure (!) 157/86, pulse (!) 59, temperature 98.2 F (36.8 C), resp. rate 19, height 6\' 1"  (1.854 m), weight 55.8 kg, SpO2 100 %.   Intake/Output Summary (Last 24 hours) at 05/27/2021 1341 Last data filed at 05/27/2021 1300 Gross per 24 hour  Intake 680 ml  Output 3020 ml  Net -2340 ml    Exam Gen:- Awake Alert, no acute distress , cooperative HEENT:- [place].AT, No sclera icterus Neck-Supple Neck,No JVD,.  Lungs-  CTAB , good air movement bilaterally  CV- S1, S2 normal, regular Abd-  +ve B.Sounds, Abd Soft, No tenderness,    Extremity/Skin:- No  edema,   good pulses Psych-affect is flat, intermittently confused and at times disoreinted Neuro-Generalized weakness, No tremors    Data Review   CBC w Diff:  Lab Results  Component Value Date   WBC 8.0 05/25/2021   HGB 8.6 (L) 05/25/2021   HCT 26.1 (L) 05/25/2021   PLT 204 05/25/2021   LYMPHOPCT 37 05/24/2021   MONOPCT 8 05/24/2021   EOSPCT 2 05/24/2021   BASOPCT 0 05/24/2021    CMP:  Lab Results  Component Value Date   NA 137 05/26/2021    K 4.2 05/26/2021   CL 108 05/26/2021   CO2 22 05/26/2021   BUN 30 (H) 05/26/2021   CREATININE 1.95 (H) 05/26/2021   PROT 7.5 05/24/2021   ALBUMIN 3.9 05/24/2021   BILITOT 0.8 05/24/2021   ALKPHOS 53 05/24/2021   AST 26 05/24/2021   ALT 31 05/24/2021  .   Total Discharge time is about 33 minutes  05/26/2021 M.D on 05/27/2021 at 1:41 PM  Go to [email or web address] -  for contact info  [facility] - Office  [phone]

## 2021-05-27 NOTE — TOC Transition Note (Signed)
Transition of Care [facility]) - CM/SW Discharge Note   Patient Details  Name: Joshua Morales MRN: [id number] Date of Birth: Jan 17, 1946  [facility] CM/SW Contact:  Annice Needy, LCSW Phone Number: 05/27/2021, 1:41 PM   Clinical Narrative:    Daughter, [name], advised that the family had made the decision to take patient home with [facility] and not go to SNF. Attending notified. Family to pick patient up and transport him home.     Barriers to Discharge: No Barriers Identified   Patient Goals and CMS Choice Patient states their goals for this hospitalization and ongoing recovery are:: SNF   Choice offered to / list presented to : Adult Children  Discharge Placement                       Discharge Plan and Services     Post Acute Care Choice: Skilled Nursing Facility                    HH Arranged: PT, OT, RN, Speech Therapy HH Agency: [facility] Date [facility] Agency Contacted: 05/27/21 Time HH Agency Contacted: [phone] Representative spoke with at [facility] Agency: linda  Social Determinants of Health (SDOH) Interventions     Readmission Risk Interventions No flowsheet data found.

## 2021-05-27 NOTE — Progress Notes (Signed)
Inpatient Diabetes Program Recommendations  AACE/ADA: New Consensus Statement on Inpatient Glycemic Control (2015)  Target Ranges:  Prepandial:   less than 140 mg/dL      Peak postprandial:   less than 180 mg/dL (1-2 hours)      Critically ill patients:  140 - 180 mg/dL   Lab Results  Component Value Date   GLUCAP 335 (H) 05/27/2021   HGBA1C 12.1 (H) 04/05/2021    Review of Glycemic Control Results for IMARI, SIVERTSEN (MRN [id number]) as of 05/27/2021 13:09  Ref. Range 05/26/2021 20:08 05/27/2021 07:44 05/27/2021 11:24  Glucose-Capillary Latest Ref Range: 70 - 99 mg/dL 829 (H) 562 (H) 130 (H)  Diabetes history: DM 2 Outpatient Diabetes medications:  Levemir 8 units daily, Novolog 2 units tid with meals  Current orders for Inpatient glycemic control:  Novolog sensitive TID & HS   Inpatient Diabetes Program Recommendations:    If to remain inpatient: Consider adding Levemir 6 units QD.   Thanks, Lujean Rave, MSN, RNC-OB Diabetes Coordinator [phone] (8a-5p)

## 2021-06-02 ENCOUNTER — Encounter (HOSPITAL_COMMUNITY): Payer: Self-pay | Admitting: *Deleted

## 2021-06-02 ENCOUNTER — Inpatient Hospital Stay (HOSPITAL_COMMUNITY)
Admission: EM | Admit: 2021-06-02 | Discharge: 2021-06-05 | DRG: 065 | Disposition: A | Discharge status: Skilled Nursing Facility | Payer: No Typology Code available for payment source | Attending: Family Medicine | Admitting: Family Medicine

## 2021-06-02 ENCOUNTER — Emergency Department (HOSPITAL_COMMUNITY): Payer: No Typology Code available for payment source

## 2021-06-02 ENCOUNTER — Other Ambulatory Visit: Payer: Self-pay

## 2021-06-02 DIAGNOSIS — R4182 Altered mental status, unspecified: Secondary | ICD-10-CM | POA: Diagnosis not present

## 2021-06-02 DIAGNOSIS — IMO0002 Reserved for concepts with insufficient information to code with codable children: Secondary | ICD-10-CM | POA: Diagnosis present

## 2021-06-02 DIAGNOSIS — Z79899 Other long term (current) drug therapy: Secondary | ICD-10-CM

## 2021-06-02 DIAGNOSIS — E1165 Type 2 diabetes mellitus with hyperglycemia: Secondary | ICD-10-CM | POA: Diagnosis present

## 2021-06-02 DIAGNOSIS — Z20822 Contact with and (suspected) exposure to covid-19: Secondary | ICD-10-CM | POA: Diagnosis present

## 2021-06-02 DIAGNOSIS — F17210 Nicotine dependence, cigarettes, uncomplicated: Secondary | ICD-10-CM | POA: Diagnosis present

## 2021-06-02 DIAGNOSIS — R7989 Other specified abnormal findings of blood chemistry: Secondary | ICD-10-CM | POA: Insufficient documentation

## 2021-06-02 DIAGNOSIS — Z681 Body mass index (BMI) 19 or less, adult: Secondary | ICD-10-CM

## 2021-06-02 DIAGNOSIS — Z902 Acquired absence of lung [part of]: Secondary | ICD-10-CM

## 2021-06-02 DIAGNOSIS — I129 Hypertensive chronic kidney disease with stage 1 through stage 4 chronic kidney disease, or unspecified chronic kidney disease: Secondary | ICD-10-CM | POA: Diagnosis present

## 2021-06-02 DIAGNOSIS — D539 Nutritional anemia, unspecified: Secondary | ICD-10-CM | POA: Diagnosis present

## 2021-06-02 DIAGNOSIS — F0150 Vascular dementia without behavioral disturbance: Secondary | ICD-10-CM | POA: Diagnosis present

## 2021-06-02 DIAGNOSIS — R471 Dysarthria and anarthria: Secondary | ICD-10-CM | POA: Diagnosis present

## 2021-06-02 DIAGNOSIS — F1027 Alcohol dependence with alcohol-induced persisting dementia: Secondary | ICD-10-CM | POA: Diagnosis present

## 2021-06-02 DIAGNOSIS — N183 Chronic kidney disease, stage 3 unspecified: Secondary | ICD-10-CM | POA: Diagnosis present

## 2021-06-02 DIAGNOSIS — I10 Essential (primary) hypertension: Secondary | ICD-10-CM

## 2021-06-02 DIAGNOSIS — Z720 Tobacco use: Secondary | ICD-10-CM

## 2021-06-02 DIAGNOSIS — I513 Intracardiac thrombosis, not elsewhere classified: Secondary | ICD-10-CM | POA: Diagnosis not present

## 2021-06-02 DIAGNOSIS — S90821A Blister (nonthermal), right foot, initial encounter: Secondary | ICD-10-CM | POA: Diagnosis present

## 2021-06-02 DIAGNOSIS — Z8249 Family history of ischemic heart disease and other diseases of the circulatory system: Secondary | ICD-10-CM

## 2021-06-02 DIAGNOSIS — I639 Cerebral infarction, unspecified: Secondary | ICD-10-CM | POA: Diagnosis not present

## 2021-06-02 DIAGNOSIS — E11649 Type 2 diabetes mellitus with hypoglycemia without coma: Secondary | ICD-10-CM | POA: Diagnosis not present

## 2021-06-02 DIAGNOSIS — F1011 Alcohol abuse, in remission: Secondary | ICD-10-CM | POA: Diagnosis present

## 2021-06-02 DIAGNOSIS — Z8673 Personal history of transient ischemic attack (TIA), and cerebral infarction without residual deficits: Secondary | ICD-10-CM

## 2021-06-02 DIAGNOSIS — Z794 Long term (current) use of insulin: Secondary | ICD-10-CM

## 2021-06-02 DIAGNOSIS — R531 Weakness: Secondary | ICD-10-CM

## 2021-06-02 DIAGNOSIS — E785 Hyperlipidemia, unspecified: Secondary | ICD-10-CM | POA: Diagnosis present

## 2021-06-02 DIAGNOSIS — R778 Other specified abnormalities of plasma proteins: Secondary | ICD-10-CM

## 2021-06-02 DIAGNOSIS — Z833 Family history of diabetes mellitus: Secondary | ICD-10-CM

## 2021-06-02 DIAGNOSIS — I472 Ventricular tachycardia, unspecified: Secondary | ICD-10-CM | POA: Diagnosis not present

## 2021-06-02 DIAGNOSIS — Z7901 Long term (current) use of anticoagulants: Secondary | ICD-10-CM

## 2021-06-02 DIAGNOSIS — E1122 Type 2 diabetes mellitus with diabetic chronic kidney disease: Secondary | ICD-10-CM | POA: Diagnosis present

## 2021-06-02 DIAGNOSIS — E440 Moderate protein-calorie malnutrition: Secondary | ICD-10-CM | POA: Diagnosis present

## 2021-06-02 DIAGNOSIS — Z808 Family history of malignant neoplasm of other organs or systems: Secondary | ICD-10-CM

## 2021-06-02 DIAGNOSIS — N1832 Chronic kidney disease, stage 3b: Secondary | ICD-10-CM | POA: Diagnosis present

## 2021-06-02 LAB — COMPREHENSIVE METABOLIC PANEL
ALT: 35 U/L (ref 0–44)
AST: 26 U/L (ref 15–41)
Albumin: 3.7 g/dL (ref 3.5–5.0)
Alkaline Phosphatase: 50 U/L (ref 38–126)
Anion gap: 9 (ref 5–15)
BUN: 31 mg/dL — ABNORMAL HIGH (ref 8–23)
CO2: 23 mmol/L (ref 22–32)
Calcium: 8.9 mg/dL (ref 8.9–10.3)
Chloride: 104 mmol/L (ref 98–111)
Creatinine, Ser: 1.92 mg/dL — ABNORMAL HIGH (ref 0.61–1.24)
GFR, Estimated: 36 mL/min — ABNORMAL LOW (ref >60)
Glucose, Bld: 277 mg/dL — ABNORMAL HIGH (ref 70–99)
Potassium: 5 mmol/L (ref 3.5–5.1)
Sodium: 136 mmol/L (ref 135–145)
Total Bilirubin: 0.8 mg/dL (ref 0.3–1.2)
Total Protein: 6.8 g/dL (ref 6.5–8.1)

## 2021-06-02 LAB — CBC WITH DIFFERENTIAL/PLATELET
Abs Immature Granulocytes: 0.06 10*3/uL (ref 0.00–0.07)
Basophils Absolute: 0.1 10*3/uL (ref 0.0–0.1)
Basophils Relative: 0 %
Eosinophils Absolute: 0.1 10*3/uL (ref 0.0–0.5)
Eosinophils Relative: 1 %
HCT: 28.2 % — ABNORMAL LOW (ref 39.0–52.0)
Hemoglobin: 8.8 g/dL — ABNORMAL LOW (ref 13.0–17.0)
Immature Granulocytes: 1 %
Lymphocytes Relative: 17 %
Lymphs Abs: 2 10*3/uL (ref 0.7–4.0)
MCH: 32.8 pg (ref 26.0–34.0)
MCHC: 31.2 g/dL (ref 30.0–36.0)
MCV: 105.2 fL — ABNORMAL HIGH (ref 80.0–100.0)
Monocytes Absolute: 0.7 10*3/uL (ref 0.1–1.0)
Monocytes Relative: 7 %
Neutro Abs: 8.5 10*3/uL — ABNORMAL HIGH (ref 1.7–7.7)
Neutrophils Relative %: 74 %
Platelets: 164 10*3/uL (ref 150–400)
RBC: 2.68 MIL/uL — ABNORMAL LOW (ref 4.22–5.81)
RDW: 13.9 % (ref 11.5–15.5)
WBC: 11.5 10*3/uL — ABNORMAL HIGH (ref 4.0–10.5)
nRBC: 0 % (ref 0.0–0.2)

## 2021-06-02 LAB — URINALYSIS, ROUTINE W REFLEX MICROSCOPIC
Bacteria, UA: NONE SEEN
Bilirubin Urine: NEGATIVE
Glucose, UA: 150 mg/dL — AB
Ketones, ur: NEGATIVE mg/dL
Leukocytes,Ua: NEGATIVE
Nitrite: NEGATIVE
Protein, ur: >=300 mg/dL — AB
Specific Gravity, Urine: 1.014 (ref 1.005–1.030)
pH: 5 (ref 5.0–8.0)

## 2021-06-02 LAB — GLUCOSE, CAPILLARY
Glucose-Capillary: 198 mg/dL — ABNORMAL HIGH (ref 70–99)
Glucose-Capillary: 188 mg/dL — ABNORMAL HIGH (ref 70–99)

## 2021-06-02 LAB — ETHANOL: Alcohol, Ethyl (B): <10 mg/dL (ref <10)

## 2021-06-02 LAB — RESP PANEL BY RT-PCR (FLU A&B, COVID) ARPGX2
Influenza A by PCR: NEGATIVE
Influenza B by PCR: NEGATIVE
SARS Coronavirus 2 by RT PCR: NEGATIVE

## 2021-06-02 LAB — CBG MONITORING, ED: Glucose-Capillary: 260 mg/dL — ABNORMAL HIGH (ref 70–99)

## 2021-06-02 LAB — TROPONIN I (HIGH SENSITIVITY)
Troponin I (High Sensitivity): 238 ng/L (ref <18)
Troponin I (High Sensitivity): 236 ng/L (ref <18)

## 2021-06-02 LAB — LACTIC ACID, PLASMA: Lactic Acid, Venous: 1.6 mmol/L (ref 0.5–1.9)

## 2021-06-02 LAB — TSH: TSH: 1.323 u[IU]/mL (ref 0.350–4.500)

## 2021-06-02 MED ORDER — SODIUM CHLORIDE 0.9 % IV SOLN: INTRAVENOUS | Status: DC

## 2021-06-02 MED ORDER — VITAMIN D 25 MCG (1000 UNIT) PO TABS
2000.0000 [IU] | ORAL_TABLET | Freq: Every day | ORAL | Status: DC
  Administered 2021-06-03 – 2021-06-05 (×3): 2000 [IU] via ORAL
  Filled 2021-06-02 (×3): qty 2

## 2021-06-02 MED ORDER — MEMANTINE HCL 10 MG PO TABS
5.0000 mg | ORAL_TABLET | Freq: Two times a day (BID) | ORAL | Status: DC
  Administered 2021-06-02 – 2021-06-05 (×6): 5 mg via ORAL
  Filled 2021-06-02 (×6): qty 1

## 2021-06-02 MED ORDER — ACETAMINOPHEN 650 MG RE SUPP: 650.0000 mg | RECTAL | Status: DC | PRN

## 2021-06-02 MED ORDER — HYDROXYZINE HCL 10 MG PO TABS: 10.0000 mg | ORAL_TABLET | Freq: Two times a day (BID) | ORAL | Status: DC | PRN

## 2021-06-02 MED ORDER — INSULIN ASPART 100 UNIT/ML IJ SOLN
4.0000 [IU] | Freq: Three times a day (TID) | INTRAMUSCULAR | Status: DC
  Administered 2021-06-02 – 2021-06-04 (×4): 4 [IU] via SUBCUTANEOUS

## 2021-06-02 MED ORDER — LORAZEPAM 0.5 MG PO TABS: 0.5000 mg | ORAL_TABLET | Freq: Once | ORAL | Status: DC

## 2021-06-02 MED ORDER — THIAMINE HCL 100 MG PO TABS
100.0000 mg | ORAL_TABLET | Freq: Every day | ORAL | Status: DC
  Administered 2021-06-02 – 2021-06-05 (×4): 100 mg via ORAL
  Filled 2021-06-02 (×3): qty 1

## 2021-06-02 MED ORDER — INSULIN DETEMIR 100 UNIT/ML ~~LOC~~ SOLN
8.0000 [IU] | Freq: Every day | SUBCUTANEOUS | Status: DC
  Administered 2021-06-02 – 2021-06-05 (×4): 8 [IU] via SUBCUTANEOUS
  Filled 2021-06-02 (×7): qty 0.08

## 2021-06-02 MED ORDER — RIVAROXABAN 15 MG PO TABS
15.0000 mg | ORAL_TABLET | Freq: Every day | ORAL | Status: DC
  Administered 2021-06-02: 15 mg via ORAL
  Filled 2021-06-02: qty 1

## 2021-06-02 MED ORDER — DONEPEZIL HCL 5 MG PO TABS
5.0000 mg | ORAL_TABLET | Freq: Every day | ORAL | Status: DC
  Administered 2021-06-02 – 2021-06-04 (×3): 5 mg via ORAL
  Filled 2021-06-02 (×3): qty 1

## 2021-06-02 MED ORDER — QUETIAPINE FUMARATE 25 MG PO TABS
25.0000 mg | ORAL_TABLET | Freq: Every day | ORAL | Status: DC
  Administered 2021-06-02 – 2021-06-04 (×3): 25 mg via ORAL
  Filled 2021-06-02 (×3): qty 1

## 2021-06-02 MED ORDER — MIRTAZAPINE 15 MG PO TABS
15.0000 mg | ORAL_TABLET | Freq: Every day | ORAL | Status: DC
  Administered 2021-06-02 – 2021-06-04 (×3): 15 mg via ORAL
  Filled 2021-06-02 (×3): qty 1

## 2021-06-02 MED ORDER — INSULIN ASPART 100 UNIT/ML IJ SOLN
0.0000 [IU] | Freq: Three times a day (TID) | INTRAMUSCULAR | Status: DC
  Administered 2021-06-02: 2 [IU] via SUBCUTANEOUS
  Administered 2021-06-03: 5 [IU] via SUBCUTANEOUS
  Administered 2021-06-03: 1 [IU] via SUBCUTANEOUS
  Administered 2021-06-04: 3 [IU] via SUBCUTANEOUS
  Administered 2021-06-05: 5 [IU] via SUBCUTANEOUS

## 2021-06-02 MED ORDER — STROKE: EARLY STAGES OF RECOVERY BOOK
Freq: Once | Status: AC
  Filled 2021-06-02: qty 1

## 2021-06-02 MED ORDER — ACETAMINOPHEN 160 MG/5ML PO SOLN: 650.0000 mg | ORAL | Status: DC | PRN

## 2021-06-02 MED ORDER — SENNOSIDES-DOCUSATE SODIUM 8.6-50 MG PO TABS: 1.0000 | ORAL_TABLET | Freq: Every evening | ORAL | Status: DC | PRN

## 2021-06-02 MED ORDER — ADULT MULTIVITAMIN W/MINERALS CH
1.0000 | ORAL_TABLET | Freq: Every day | ORAL | Status: DC
  Administered 2021-06-03 – 2021-06-05 (×3): 1 via ORAL
  Filled 2021-06-02 (×3): qty 1

## 2021-06-02 MED ORDER — LORAZEPAM 2 MG/ML IJ SOLN: 1.0000 mg | Freq: Once | INTRAMUSCULAR | Status: DC

## 2021-06-02 MED ORDER — LORAZEPAM 2 MG/ML IJ SOLN: 0.5000 mg | Freq: Once | INTRAMUSCULAR | Status: DC

## 2021-06-02 MED ORDER — INSULIN ASPART 100 UNIT/ML IJ SOLN: 0.0000 [IU] | Freq: Every day | INTRAMUSCULAR | Status: DC

## 2021-06-02 MED ORDER — FOLIC ACID 1 MG PO TABS
1.0000 mg | ORAL_TABLET | Freq: Every day | ORAL | Status: DC
  Administered 2021-06-02 – 2021-06-05 (×4): 1 mg via ORAL
  Filled 2021-06-02 (×4): qty 1

## 2021-06-02 MED ORDER — ACETAMINOPHEN 325 MG PO TABS: 650.0000 mg | ORAL_TABLET | ORAL | Status: DC | PRN

## 2021-06-02 MED ORDER — ATORVASTATIN CALCIUM 40 MG PO TABS
40.0000 mg | ORAL_TABLET | Freq: Every day | ORAL | Status: DC
  Administered 2021-06-02 – 2021-06-03 (×2): 40 mg via ORAL
  Filled 2021-06-02 (×2): qty 1

## 2021-06-02 NOTE — Progress Notes (Signed)
Inpatient Diabetes Program Recommendations  AACE/ADA: New Consensus Statement on Inpatient Glycemic Control   Target Ranges:  Prepandial:   less than 140 mg/dL      Peak postprandial:   less than 180 mg/dL (1-2 hours)      Critically ill patients:  140 - 180 mg/dL  Results for KADEEM, HYLE (MRN [id number]) as of 06/02/2021 10:41  Ref. Range 06/02/2021 09:07  Glucose Latest Ref Range: 70 - 99 mg/dL 725 (H)   Results for AHREN, PETTINGER (MRN [id number]) as of 06/02/2021 10:41  Ref. Range 06/02/2021 08:30  Glucose-Capillary Latest Ref Range: 70 - 99 mg/dL 425 (H)    Review of Glycemic Control  Diabetes history: DM2 Outpatient Diabetes medications: Levemir 8 units daily, Novolog 4 units TID Current orders for Inpatient glycemic control: None; in ED  Inpatient Diabetes Program Recommendations:    Insulin: If admitted, please consider ordering Levemir 5 units Q24H and Novolog 0-6 units AC&HS.  Thanks, [name], RN, MSN, CDE Diabetes Coordinator Inpatient Diabetes Program [phone] (Team Pager from 8am to 5pm)

## 2021-06-02 NOTE — ED Notes (Signed)
Critical troponin 238, MD notified (Dr. Charm Barges).  Continue to monitor patient

## 2021-06-02 NOTE — H&P (Signed)
History and Physical    Joshua Morales:[id number] DOB: 04/20/1946 DOA: 06/02/2021  PCP: [facility]   Chief Complaint: bilateral lower extremity edema and 'slow to respond' but currently he responds normally  HPI: Joshua Morales is a 75 y.o. male with medical history significant of dementia, htn, CKD3b, DM, atrial thrombus, anticoagulant use, and previous CVA's. Per the patient's daughter he was slow to respond this morning and has bilateral lower extremity edema limited to the ankles and feet. He is hypertensive, 190/73, hyperglycemic 277, slightly bradycardic with an occasional irregularity, ekg consistent with his previous hospitalization, sinus rhythm rate of 55, without st-segment changes. Recently he had a holter monitor after his most recent cva. A brief 4 beat run of vtach is reported, a four beat run of svt.   Review of Systems: Review of Systems  Constitutional:  Negative for chills, fever and weight loss.  Respiratory:  Negative for shortness of breath and wheezing.   Cardiovascular:  Positive for leg swelling.  Gastrointestinal:  Negative for abdominal pain.  Genitourinary:  Negative for dysuria.  Skin:  Negative for rash.    As per HPI otherwise 10 point review of systems negative.   No Known Allergies  Past Medical History:  Diagnosis Date   Agent orange exposure    Atrial thrombus 05/05/2021   CRD (chronic renal disease), stage 3b (HCC) 05/05/2021   Dementia (HCC)    Diabetes mellitus without complication (HCC)    Hypertension    Macrocytic anemia 05/05/2021    Past Surgical History:  Procedure Laterality Date   BUBBLE STUDY  04/07/2021   Procedure: BUBBLE STUDY;  Surgeon: Jonelle Sidle, MD;  Location: [facility];  Service: Cardiovascular;;  with TEE   LUNG REMOVAL, PARTIAL     TEE WITHOUT CARDIOVERSION N/A 04/07/2021   Procedure: TRANSESOPHAGEAL ECHOCARDIOGRAM (TEE);  Surgeon: Jonelle Sidle, MD;  Location: [facility];  Service: Cardiovascular;   Laterality: N/A;   TOE AMPUTATION       reports that he has been smoking cigarettes. He does not have any smokeless tobacco history on file. He reports current alcohol use. He reports that he does not use drugs.  Family History  Problem Relation Age of Onset   Congestive Heart Failure Mother    Diabetes Mellitus I Mother    Dementia Father    Throat cancer Brother     Prior to Admission medications   Medication Sig Start Date End Date Taking? Authorizing Provider  ACIDOPHILUS LACTOBACILLUS PO Take 2 capsules by mouth in the morning and at bedtime.   Yes [provider]  atenolol (TENORMIN) 25 MG tablet Take 1 tablet (25 mg total) by mouth daily. 04/08/21  Yes Johnson, Clanford L, MD  atorvastatin (LIPITOR) 40 MG tablet Take 40 mg by mouth daily.   Yes [provider]  Cholecalciferol (D3-1000) 25 MCG (1000 UT) tablet Take 2,000 Units by mouth daily.   Yes [provider]  donepezil (ARICEPT) 5 MG tablet Take 1 tablet (5 mg total) by mouth at bedtime. 04/08/21  Yes Johnson, Clanford L, MD  folic acid (FOLVITE) 1 MG tablet Take 1 tablet (1 mg total) by mouth daily. 04/08/21  Yes Johnson, Clanford L, MD  hydrOXYzine (ATARAX/VISTARIL) 10 MG tablet Take 10 mg by mouth 2 (two) times daily as needed for itching.   Yes [provider]  insulin aspart (NOVOLOG) 100 UNIT/ML FlexPen Inject 2 Units into the skin 3 (three) times daily with meals. IF EATS  50% OR MORE OF MEAL. Patient taking differently: Inject 4 Units into the skin 3 (three) times daily with meals. IF EATS 50% OR MORE OF MEAL. 04/08/21  Yes Johnson, Clanford L, MD  insulin detemir (LEVEMIR) 100 UNIT/ML FlexPen Inject 8 Units into the skin daily.   Yes [provider]  Insulin Pen Needle 31G X 5 MM MISC 1 Device by Does not apply route as directed. 04/08/21  Yes Johnson, Clanford L, MD  memantine (NAMENDA) 5 MG tablet Take 1 tablet (5 mg total) by mouth 2 (two) times daily. 04/08/21  Yes Johnson, Clanford  L, MD  mirtazapine (REMERON) 15 MG tablet Take 15 mg by mouth at bedtime. 08/07/20  Yes [provider]  Multiple Vitamin (MULTIVITAMIN WITH MINERALS) TABS tablet Take 1 tablet by mouth daily. 04/08/21  Yes Johnson, Clanford L, MD  QUEtiapine (SEROQUEL) 25 MG tablet Take 1 tablet (25 mg total) by mouth at bedtime. 04/08/21  Yes Johnson, Clanford L, MD  Rivaroxaban (XARELTO) 15 MG TABS tablet Take 1 tablet (15 mg total) by mouth daily with supper. 05/27/21  Yes Shon Hale, MD  thiamine 100 MG tablet Take 1 tablet (100 mg total) by mouth daily. 04/08/21  Yes Johnson, Clanford L, MD  hydrALAZINE (APRESOLINE) 50 MG tablet Take 1 tablet (50 mg total) by mouth 3 (three) times daily. Patient not taking: Reported on 06/02/2021 05/27/21   Shon Hale, MD  polyethylene glycol (MIRALAX / GLYCOLAX) 17 g packet Take 17 g by mouth daily as needed for mild constipation. Patient not taking: Reported on 06/02/2021 04/08/21   Cleora Fleet, MD    Physical Exam: Vitals:   06/02/21 0931 06/02/21 1030 06/02/21 1100 06/02/21 1200  BP: (!) 182/77 (!) 176/81 (!) 192/81 (!) 190/73  Pulse: (!) 58 (!) 58 (!) 57 (!) 57  Resp: 12 10 (!) 21 12  Temp:      TempSrc:      SpO2: 98% 98% 99% 99%  Weight:      Height:       Physical Exam Constitutional:      General: He is not in acute distress.    Appearance: He is not ill-appearing, toxic-appearing or diaphoretic.  HENT:     Head: Normocephalic.  Cardiovascular:     Rate and Rhythm: Normal rate. Rhythm irregular.     Pulses: Normal pulses.     Heart sounds: Normal heart sounds. No murmur heard.   No friction rub. No gallop.  Pulmonary:     Effort: Pulmonary effort is normal. No respiratory distress.     Breath sounds: Normal breath sounds. No wheezing.  Abdominal:     General: Abdomen is flat. Bowel sounds are normal. There is no distension.     Palpations: Abdomen is soft.     Tenderness: There is no abdominal tenderness. There is no guarding.   Musculoskeletal:     Right lower leg: Edema present.     Left lower leg: Edema present.  Skin:    General: Skin is warm and dry.     Capillary Refill: Capillary refill takes less than 2 seconds.     Coloration: Skin is not pale.     Findings: No erythema.  Neurological:     Mental Status: He is alert. Mental status is at baseline.  Psychiatric:        Mood and Affect: Mood normal.      Labs on Admission: I have personally reviewed the patients's labs and imaging studies.  Assessment/Plan  Principal Problem:   Acute CVA (cerebrovascular accident)/Multiple punctate acute/sub infarcts in the bilateral anterior -head ct without new findings -head mri shows new findings suggestive of new small infarcts  Active Problems:    Diabetes mellitus type 2, uncontrolled -ssi -carb modified diet  Hyperlipidemia -continue home atorvastatin    HTN (hypertension) uncontrolled -pt daughter reports he has not had any medications today -hold home atenolol due to new cva on mri and permissive hypertension -hold home hydralazine due to new cva on mri and permissive hypertension    Tobacco abuse    Alcohol abuse, 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 Observation status is judged to be reasonable and necessary in order to provide the required intensity of service to ensure the patient's safety. The patient's presenting symptoms, physical exam findings, and initial radiographic and laboratory data in the context of their medical condition is felt to place them at decreased risk for further clinical deterioration. Furthermore, it is anticipated that the patient will  be medically stable for discharge from the hospital within 2 midnights of admission. The following factors support the patient status of observation.   " The patient's presenting symptoms include new cva findings " The physical exam findings include lower extremity edema " The initial radiographic and laboratory data are suggestive of new cva findings      Reneshia Zuccaro D Luiz Trumpower student [facility] If 7PM-7AM, please contact night-coverage [email or web address]  06/02/2021, 2:22 PM

## 2021-06-02 NOTE — ED Provider Notes (Signed)
[facility] EMERGENCY DEPARTMENT Provider Note   CSN: [id number] Arrival date & time: 06/02/21  7619     History Chief Complaint  Patient presents with   Altered Mental Status    Joshua Morales is a 75 y.o. male.  He is brought in by EMS from home for swelling in his feet, wound on right heel, slower to respond since waking up this morning.  EMS states that patient has not taken his medications this morning.  Patient himself denies any complaints.  Level 5 caveat secondary to dementia.  He denies any headache chest pain shortness of breath abdominal pain vomiting diarrhea or urinary symptoms.  He has a little bit of pain to both feet.  The history is provided by the patient and the EMS personnel.  Altered Mental Status Presenting symptoms: behavior changes   Severity:  Unable to specify Most recent episode:  Today Episode history:  Continuous Progression:  Unchanged Chronicity:  New Associated symptoms: no abdominal pain, no difficulty breathing, no fever, no headaches, no nausea, no rash, no slurred speech and no vomiting       Past Medical History:  Diagnosis Date   Agent orange exposure    Atrial thrombus 05/05/2021   CRD (chronic renal disease), stage 3b (HCC) 05/05/2021   Dementia (HCC)    Diabetes mellitus without complication (HCC)    Hypertension    Macrocytic anemia 05/05/2021    Patient Active Problem List   Diagnosis Date Noted   Acute ischemic stroke (HCC) 05/25/2021   Acute renal failure superimposed on stage 3b chronic kidney disease (HCC) 05/25/2021   Right sided weakness 05/24/2021   Enteritis, enterotoxigenic E. 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/05/2021   Acute metabolic encephalopathy 04/05/2021    Past Surgical History:  Procedure Laterality Date   BUBBLE STUDY  04/07/2021   Procedure: BUBBLE STUDY;  Surgeon: Jonelle Sidle, MD;  Location: [facility];  Service: Cardiovascular;;  with TEE   LUNG REMOVAL, PARTIAL     TEE WITHOUT CARDIOVERSION N/A 04/07/2021   Procedure: TRANSESOPHAGEAL ECHOCARDIOGRAM (TEE);  Surgeon: Jonelle Sidle, MD;  Location: [facility];  Service: Cardiovascular;  Laterality: N/A;   TOE AMPUTATION         Family History  Problem Relation Age of Onset   Congestive Heart Failure Mother    Diabetes Mellitus I Mother    Dementia Father    Throat cancer Brother     Social History   Tobacco Use   Smoking status: Every Day    Types: Cigarettes  Vaping Use   Vaping Use: Never used  Substance Use Topics   Alcohol use: Yes    Comment: occ   Drug use: Never    Home Medications Prior to Admission medications   Medication Sig Start Date End Date Taking? Authorizing Provider  ACIDOPHILUS LACTOBACILLUS PO Take 2 capsules by mouth in the morning and at bedtime.    [provider]  atenolol (TENORMIN) 25 MG tablet  Take 1 tablet (25 mg total) by mouth daily. 04/08/21   Johnson, Clanford L, MD  atorvastatin (LIPITOR) 40 MG tablet Take 40 mg by mouth daily.    [provider]  Cholecalciferol (D3-1000) 25 MCG (1000 UT) tablet Take 2,000 Units by mouth daily.    [provider]  donepezil (ARICEPT) 5 MG tablet Take 1 tablet (5 mg total) by mouth at bedtime. 04/08/21   Johnson, Clanford L, MD  folic acid (FOLVITE) 1 MG tablet Take 1 tablet (1 mg total) by mouth daily. 04/08/21   Johnson, Clanford L, MD  hydrALAZINE (APRESOLINE) 50 MG  tablet Take 1 tablet (50 mg total) by mouth 3 (three) times daily. 05/27/21   Shon Hale, MD  hydrOXYzine (ATARAX/VISTARIL) 10 MG tablet Take 10 mg by mouth 2 (two) times daily as needed for itching.    [provider]  insulin aspart (NOVOLOG) 100 UNIT/ML FlexPen Inject 2 Units into the skin 3 (three) times daily with meals. IF EATS 50% OR MORE OF MEAL. Patient taking differently: Inject 4 Units into the skin 3 (three) times daily with meals. IF EATS 50% OR MORE OF MEAL. 04/08/21   Johnson, Clanford L, MD  insulin detemir (LEVEMIR) 100 UNIT/ML FlexPen Inject 8 Units into the skin daily.    [provider]  Insulin Pen Needle 31G X 5 MM MISC 1 Device by Does not apply route as directed. 04/08/21   Johnson, Clanford L, MD  memantine (NAMENDA) 5 MG tablet Take 1 tablet (5 mg total) by mouth 2 (two) times daily. 04/08/21   Johnson, Clanford L, MD  mirtazapine (REMERON) 15 MG tablet Take 15 mg by mouth at bedtime. 08/07/20   [provider]  Multiple Vitamin (MULTIVITAMIN WITH MINERALS) TABS tablet Take 1 tablet by mouth daily. 04/08/21   Johnson, Clanford L, MD  polyethylene glycol (MIRALAX / GLYCOLAX) 17 g packet Take 17 g by mouth daily as needed for mild constipation. 04/08/21   Johnson, Clanford L, MD  QUEtiapine (SEROQUEL) 25 MG tablet Take 1 tablet (25 mg total) by mouth at bedtime. 04/08/21   Johnson, Clanford L, MD  Rivaroxaban (XARELTO) 15 MG TABS tablet Take 1 tablet (15 mg total) by mouth daily with supper. 05/27/21   Shon Hale, MD  thiamine 100 MG tablet Take 1 tablet (100 mg total) by mouth daily. 04/08/21   Cleora Fleet, MD    Allergies    Patient has no known allergies.  Review of Systems   Review of Systems  Constitutional:  Negative for fever.  HENT:  Negative for sore throat.   Eyes:  Negative for visual disturbance.  Respiratory:  Negative for shortness of breath.   Cardiovascular:  Positive for leg swelling. Negative for chest pain.   Gastrointestinal:  Negative for abdominal pain, nausea and vomiting.  Genitourinary:  Negative for dysuria.  Musculoskeletal:  Negative for neck pain.  Skin:  Negative for rash.  Neurological:  Negative for headaches.   Physical Exam Updated Vital Signs BP (!) 192/75 (BP Location: Right Arm)   Pulse (!) 55   Temp (!) 96.9 F (36.1 C) (Rectal)   Resp 12   Ht 6\' 1"  (1.854 m)   Wt 55.8 kg   SpO2 100%   BMI 16.23 kg/m   Physical Exam Vitals and nursing note reviewed.  Constitutional:      Appearance: Normal appearance. He is well-developed.  HENT:     Head: Normocephalic and atraumatic.  Eyes:     Conjunctiva/sclera:  Conjunctivae normal.  Cardiovascular:     Rate and Rhythm: Normal rate and regular rhythm.     Heart sounds: No murmur heard. Pulmonary:     Effort: Pulmonary effort is normal. No respiratory distress.     Breath sounds: Normal breath sounds.  Abdominal:     Palpations: Abdomen is soft.     Tenderness: There is no abdominal tenderness. There is no guarding or rebound.  Musculoskeletal:        General: No deformity. Normal range of motion.     Cervical back: Neck supple.     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  Patient has a ruptured blister on his right heel.  There is moderate edema of both feet although does not extend up into the legs and there is no calf tenderness or cords.  Distal pulses intact.  Skin:    General: Skin is warm and dry.  Neurological:     General: No focal deficit present.     Mental Status: He is alert.     Comments: Patient is awake and answering questions although he is very slow to respond.  He is moving all extremities nonfocal he.  No slurred speech or facial asymmetry.    ED Results / Procedures / Treatments   Labs (all labs ordered are listed, but only abnormal results are displayed) Labs Reviewed  COMPREHENSIVE METABOLIC PANEL - Abnormal; Notable for the following components:      Result Value    Glucose, Bld 277 (*)    BUN 31 (*)    Creatinine, Ser 1.92 (*)    GFR, Estimated 36 (*)    All other components within normal limits  CBC WITH DIFFERENTIAL/PLATELET - Abnormal; Notable for the following components:   WBC 11.5 (*)    RBC 2.68 (*)    Hemoglobin 8.8 (*)    HCT 28.2 (*)    MCV 105.2 (*)    Neutro Abs 8.5 (*)    All other components within normal limits  URINALYSIS, ROUTINE W REFLEX MICROSCOPIC - Abnormal; Notable for the following components:   Glucose, UA 150 (*)    Hgb urine dipstick MODERATE (*)    Protein, ur >=300 (*)    All other components within normal limits  GLUCOSE, CAPILLARY - Abnormal; Notable for the following components:   Glucose-Capillary 198 (*)    All other components within normal limits  CBG MONITORING, ED - Abnormal; Notable for the following components:   Glucose-Capillary 260 (*)    All other components within normal limits  TROPONIN I (HIGH SENSITIVITY) - Abnormal; Notable for the following components:   Troponin I (High Sensitivity) 238 (*)    All other components within normal limits  TROPONIN I (HIGH SENSITIVITY) - Abnormal; Notable for the following components:   Troponin I (High Sensitivity) 236 (*)    All other components within normal limits  CULTURE, BLOOD (ROUTINE X 2)  CULTURE, BLOOD (ROUTINE X 2)  RESP PANEL BY RT-PCR (FLU A&B, COVID) ARPGX2  LACTIC ACID, PLASMA  ETHANOL  TSH  HEMOGLOBIN A1C  LIPID PANEL  BASIC METABOLIC PANEL  MAGNESIUM    EKG EKG Interpretation  Date/Time:  Wednesday June 02 2021 08:48:12 EDT Ventricular Rate:  55 PR Interval:    QRS Duration: 100 QT Interval:  490 QTC Calculation: 469 R Axis:   10 Text Interpretation: Normal sinus rhythm Low voltage, extremity leads Abnormal R-wave progression, early transition Borderline ST elevation, anterolateral leads No significant change  since prior 9/22 Confirmed by Meridee Score [phone]) on 06/02/2021 8:58:57 AM  Radiology CT Head Wo  Contrast  Result Date: 06/02/2021 CLINICAL DATA:  Altered mental status EXAM: CT HEAD WITHOUT CONTRAST TECHNIQUE: Contiguous axial images were obtained from the base of the skull through the vertex without intravenous contrast. COMPARISON:  CT head 05/24/2021, brain MRI 05/25/2021 FINDINGS: Brain: There is no evidence of acute intracranial hemorrhage, extra-axial fluid collection, or acute territorial infarct. There is hypodensity in the right aspect of the splenium of the corpus callosum and left basal ganglia corresponding to the infarcts seen on the prior MRI of 05/25/2021. The other small infarcts seen on that study are not well delineated by CT, but there is no evidence of hemorrhagic transformation of any of these infarcts. There is unchanged global parenchymal volume loss with commensurate enlargement of the ventricular system. There is confluent hypodensity in the subcortical and periventricular white matter likely reflecting sequela of chronic white matter microangiopathy, unchanged. There is no mass lesion. There is no midline shift. Vascular: There is calcification of the bilateral cavernous ICAs. Skull: Normal. Negative for fracture or focal lesion. Sinuses/Orbits: The imaged paranasal sinuses are clear. A left lens implant is noted. The globes and orbits are otherwise unremarkable. Other: None. IMPRESSION: 1. No acute intracranial hemorrhage or new acute infarct. 2. Hypodensity in the right aspect of the splenium of the corpus callosum and left basal ganglia corresponding to infarcts seen on the prior MRI of 05/25/2021. The other small infarcts seen on that study are not well delineated on the current study, but there is no evidence of hemorrhagic transformation. Electronically Signed   By: Lesia Hausen M.D.   On: 06/02/2021 12:04   MR ANGIO HEAD WO CONTRAST  Result Date: 06/02/2021 CLINICAL DATA:  Bilateral leg swelling, slow to respond started this morning EXAM: MRI HEAD WITHOUT CONTRAST MRA  HEAD WITHOUT CONTRAST TECHNIQUE: Multiplanar, multi-echo pulse sequences of the brain and surrounding structures were acquired without intravenous contrast. Angiographic images of the Circle of Willis were acquired using MRA technique without intravenous contrast. COMPARISON:  Same-day noncontrast CT head: Brain MRI 05/25/2021 FINDINGS: MRI HEAD FINDINGS Brain: There are small foci of diffusion restriction in the right parietal and left frontal lobes which were not present on the prior MRI of 05/25/2021, consistent with new small acute infarcts. Additional punctate acute infarct is seen in the right cerebellar hemisphere. There is ongoing evolution of the additional small infarcts seen on the study from 05/25/2021, with overall decreased diffusion restriction. There is no evidence of acute intracranial hemorrhage. There are punctate chronic microhemorrhages in the left occipital lobe and, left lentiform nucleus, and right aspect of the splenium of the corpus callosum, nonspecific but possibly hypertensive in etiology. SW I signal dropout is also seen in the right caudate head more likely related to prior infarct. There is moderate global parenchymal volume loss with commensurate enlargement of the ventricular system. There is confluent FLAIR signal abnormality in the remainder of the subcortical and periventricular white matter likely reflecting sequela of chronic white matter microangiopathy, not significantly changed in the interim. There is no mass lesion.  There is no midline shift. Vascular: See below Skull and upper cervical spine: Normal marrow signal. Sinuses/Orbits: The paranasal sinuses are clear. A left lens implant is noted. The globes and orbits are otherwise unremarkable. Other: None. MRA HEAD FINDINGS Anterior circulation: There is irregularity of the bilateral intracranial ICAs likely reflecting atherosclerotic disease resulting in mild-to-moderate stenosis bilaterally, worse on the left  where there  is focal moderate stenosis, similar to the prior study. The bilateral MCAs and ACAs are patent with mild multifocal irregularity. There is no aneurysm. Posterior circulation: The V4 segments of the vertebral arteries are patent. The basilar artery is patent. The bilateral PCAs are patent. Anatomic variants: None. IMPRESSION: 1. Punctate acute infarcts in the right parietal and left frontal lobes and right cerebellar hemisphere are new since 05/25/2021. 2. Ongoing evolution of additional small infarcts seen on the study from 05/25/2021. 3. No evidence of acute intracranial hemorrhage. 4. No new high-grade stenosis or occlusion of the intracranial vasculature. 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 5. Unchanged parenchymal volume loss and chronic white matter microangiopathy. Electronically Signed   By: Lesia Hausen M.D.   On: 06/02/2021 13:22   MR BRAIN WO CONTRAST  Result Date: 06/02/2021 CLINICAL DATA:  Bilateral leg swelling, slow to respond started this morning EXAM: MRI HEAD WITHOUT CONTRAST MRA HEAD WITHOUT CONTRAST TECHNIQUE: Multiplanar, multi-echo pulse sequences of the brain and surrounding structures were acquired without intravenous contrast. Angiographic images of the Circle of Willis were acquired using MRA technique without intravenous contrast. COMPARISON:  Same-day noncontrast CT head: Brain MRI 05/25/2021 FINDINGS: MRI HEAD FINDINGS Brain: There are small foci of diffusion restriction in the right parietal and left frontal lobes which were not present on the prior MRI of 05/25/2021, consistent with new small acute infarcts. Additional punctate acute infarct is seen in the right cerebellar hemisphere. There is ongoing evolution of the additional small infarcts seen on the study from 05/25/2021, with overall decreased diffusion restriction. There is no evidence of  acute intracranial hemorrhage. There are punctate chronic microhemorrhages in the left occipital lobe and, left lentiform nucleus, and right aspect of the splenium of the corpus callosum, nonspecific but possibly hypertensive in etiology. SW I signal dropout is also seen in the right caudate head more likely related to prior infarct. There is moderate global parenchymal volume loss with commensurate enlargement of the ventricular system. There is confluent FLAIR signal abnormality in the remainder of the subcortical and periventricular white matter likely reflecting sequela of chronic white matter microangiopathy, not significantly changed in the interim. There is no mass lesion.  There is no midline shift. Vascular: See below Skull and upper cervical spine: Normal marrow signal. Sinuses/Orbits: The paranasal sinuses are clear. A left lens implant is noted. The globes and orbits are otherwise unremarkable. Other: None. MRA HEAD FINDINGS Anterior circulation: There is irregularity of the bilateral intracranial ICAs likely reflecting atherosclerotic disease resulting in mild-to-moderate stenosis bilaterally, worse on the left where there is focal moderate stenosis, similar to the prior study. The bilateral MCAs and ACAs are patent with mild multifocal irregularity. There is no aneurysm. Posterior circulation: The V4 segments of the vertebral arteries are patent. The basilar artery is patent. The bilateral PCAs are patent. Anatomic variants: None. IMPRESSION: 1. Punctate acute infarcts in the right parietal and left frontal lobes and right cerebellar hemisphere are new since 05/25/2021. 2. Ongoing evolution of additional small infarcts seen on the study from 05/25/2021. 3. No evidence of acute intracranial hemorrhage. 4. No new high-grade stenosis or occlusion of the intracranial vasculature. Unchanged mild to moderate stenosis of the bilateral cavernous ICAs (worse on the left) and mild multifocal narrowing of the  bilateral MCAs due to atherosclerotic disease. 5. Unchanged parenchymal volume loss and chronic white matter microangiopathy.  Electronically Signed   By: Lesia Hausen M.D.   On: 06/02/2021 13:22   DG Chest Port 1 View  Result Date: 06/02/2021 CLINICAL DATA:  75 year old male with history of bilateral leg swelling and altered mental status. Weakness. EXAM: PORTABLE CHEST 1 VIEW COMPARISON:  Chest x-ray 04/05/2021. FINDINGS: Lung volumes are increased with emphysematous changes. No consolidative airspace disease. No pleural effusions. No pneumothorax. No pulmonary nodule or mass noted. Pulmonary vasculature and the cardiomediastinal silhouette are within normal limits. Surgical clips project over the left hemithorax. Orthopedic fixation hardware in the lower cervical spine incompletely imaged. IMPRESSION: 1. No radiographic evidence of acute cardiopulmonary disease. 2. Emphysema. Electronically Signed   By: Trudie Reed M.D.   On: 06/02/2021 09:18   DG Foot Complete Right  Result Date: 06/02/2021 CLINICAL DATA:  rt foot wound heel EXAM: RIGHT FOOT COMPLETE - 3 VIEW COMPARISON:  None. FINDINGS: Normal alignment. No acute fracture. Ossification of the distal interosseous membrane. Osteopenia. Vascular calcifications. IMPRESSION: No malalignment or acute osseous abnormality. Vascular calcifications. Electronically Signed   By: Olive Bass M.D.   On: 06/02/2021 09:29    Procedures Procedures   Medications Ordered in ED Medications  atorvastatin (LIPITOR) tablet 40 mg (40 mg Oral Given 06/02/21 1539)  donepezil (ARICEPT) tablet 5 mg (has no administration in time range)  memantine (NAMENDA) tablet 5 mg (has no administration in time range)  hydrOXYzine (ATARAX/VISTARIL) tablet 10 mg (has no administration in time range)  mirtazapine (REMERON) tablet 15 mg (has no administration in time range)  QUEtiapine (SEROQUEL) tablet 25 mg (has no administration in time range)  insulin aspart (novoLOG)  injection 4 Units (4 Units Subcutaneous Given 06/02/21 1723)  insulin detemir (LEVEMIR) injection 8 Units (8 Units Subcutaneous Given 06/02/21 1723)  folic acid (FOLVITE) tablet 1 mg (1 mg Oral Given 06/02/21 1539)  Rivaroxaban (XARELTO) tablet 15 mg (15 mg Oral Given 06/02/21 1722)  cholecalciferol (VITAMIN D3) tablet 2,000 Units (has no administration in time range)  multivitamin with minerals tablet 1 tablet (has no administration in time range)  thiamine tablet 100 mg (100 mg Oral Given 06/02/21 1539)  0.9 %  sodium chloride infusion ( Intravenous Infusion Verify 06/02/21 1650)  acetaminophen (TYLENOL) tablet 650 mg (has no administration in time range)    Or  acetaminophen (TYLENOL) 160 MG/5ML solution 650 mg (has no administration in time range)    Or  acetaminophen (TYLENOL) suppository 650 mg (has no administration in time range)  senna-docusate (Senokot-S) tablet 1 tablet (has no administration in time range)  insulin aspart (novoLOG) injection 0-9 Units (2 Units Subcutaneous Given 06/02/21 1722)  insulin aspart (novoLOG) injection 0-5 Units (has no administration in time range)   stroke: mapping our early stages of recovery book ( Does not apply Given 06/02/21 1609)    ED Course  I have reviewed the triage vital signs and the nursing notes.  Pertinent labs & imaging results that were available during my care of the patient were reviewed by me and considered in my medical decision making (see chart for details).  Clinical Course as of 06/02/21 1731  Wed Jun 02, 2021  1610 Chest x-ray interpreted by me as no acute infiltrates. [MB]  [phone] Patient's daughter is here now.  She states that he has been slower to respond since his last stroke.  New feet swelling and blister on foot.  Has been eating and drinking okay. [MB]  1136 CT head is negative for acute.  Troponins remain elevated but not  rising.  MRI has been ordered.  Discussed with [facility] hospitalist Dr. Laural Benes who will evaluate the  patient. [MB]    Clinical Course User Index [MB] Terrilee Files, MD   MDM Rules/Calculators/A&P                          This patient complains of swelling of feet slower to respond; this involves an extensive number of treatment Options and is a complaint that carries with it a high risk of complications and Morbidity. The differential includes edema, stroke, arrhythmia, metabolic derangement, fluid overload, dietary indiscretion  I ordered, reviewed and interpreted labs, which included CBC with mildly elevated white count, hemoglobin low stable from priors, chemistries with elevated BUN/creatinine elevated glucose, lactate not elevated, troponin markedly elevated but flat, blood cultures sent  I ordered imaging studies which included CT head and MRI brain and I independently    visualized and interpreted imaging which showed new acute stroke Additional history obtained from patient's daughter and patient's wife Previous records obtained and reviewed in epic, recently discharged after acute stroke and anticoagulation change I consulted Dr. Laural Benes [facility] hospitalist and discussed lab and imaging findings  Critical Interventions: None  After the interventions stated above, I reevaluated the patient and found patient still to be symptomatic.  He will need admission to the hospital for further work-up.   Final Clinical Impression(s) / ED Diagnoses Final diagnoses:  Acute CVA (cerebrovascular accident) (HCC)  Elevated troponin    Rx / DC Orders ED Discharge Orders     None        Terrilee Files, MD 06/02/21 1735

## 2021-06-02 NOTE — ED Triage Notes (Signed)
Pt brought in by [facility] from home with c/o bilateral leg swelling and slow to respond starting this morning. Pt able to answer all questions for EMS. Recent hx of TIAs. EMS found a wound to pt's right heel upon assessment. CBG 247, BP 170/90 for EMS. Family reported to EMS that he hasn't had any of his meds this morning.

## 2021-06-03 ENCOUNTER — Inpatient Hospital Stay (HOSPITAL_COMMUNITY): Payer: No Typology Code available for payment source

## 2021-06-03 ENCOUNTER — Observation Stay (HOSPITAL_COMMUNITY): Payer: No Typology Code available for payment source

## 2021-06-03 DIAGNOSIS — Z808 Family history of malignant neoplasm of other organs or systems: Secondary | ICD-10-CM | POA: Diagnosis not present

## 2021-06-03 DIAGNOSIS — S90821A Blister (nonthermal), right foot, initial encounter: Secondary | ICD-10-CM | POA: Diagnosis present

## 2021-06-03 DIAGNOSIS — Z8673 Personal history of transient ischemic attack (TIA), and cerebral infarction without residual deficits: Secondary | ICD-10-CM | POA: Diagnosis not present

## 2021-06-03 DIAGNOSIS — Z833 Family history of diabetes mellitus: Secondary | ICD-10-CM | POA: Diagnosis not present

## 2021-06-03 DIAGNOSIS — I6389 Other cerebral infarction: Secondary | ICD-10-CM | POA: Diagnosis not present

## 2021-06-03 DIAGNOSIS — Z79899 Other long term (current) drug therapy: Secondary | ICD-10-CM | POA: Diagnosis not present

## 2021-06-03 DIAGNOSIS — E1122 Type 2 diabetes mellitus with diabetic chronic kidney disease: Secondary | ICD-10-CM | POA: Diagnosis present

## 2021-06-03 DIAGNOSIS — R778 Other specified abnormalities of plasma proteins: Secondary | ICD-10-CM | POA: Diagnosis not present

## 2021-06-03 DIAGNOSIS — E440 Moderate protein-calorie malnutrition: Secondary | ICD-10-CM | POA: Diagnosis present

## 2021-06-03 DIAGNOSIS — F0150 Vascular dementia without behavioral disturbance: Secondary | ICD-10-CM | POA: Diagnosis present

## 2021-06-03 DIAGNOSIS — I129 Hypertensive chronic kidney disease with stage 1 through stage 4 chronic kidney disease, or unspecified chronic kidney disease: Secondary | ICD-10-CM | POA: Diagnosis present

## 2021-06-03 DIAGNOSIS — Z902 Acquired absence of lung [part of]: Secondary | ICD-10-CM | POA: Diagnosis not present

## 2021-06-03 DIAGNOSIS — N1832 Chronic kidney disease, stage 3b: Secondary | ICD-10-CM | POA: Diagnosis present

## 2021-06-03 DIAGNOSIS — E785 Hyperlipidemia, unspecified: Secondary | ICD-10-CM | POA: Diagnosis present

## 2021-06-03 DIAGNOSIS — Z794 Long term (current) use of insulin: Secondary | ICD-10-CM | POA: Diagnosis not present

## 2021-06-03 DIAGNOSIS — F17210 Nicotine dependence, cigarettes, uncomplicated: Secondary | ICD-10-CM | POA: Diagnosis present

## 2021-06-03 DIAGNOSIS — E1165 Type 2 diabetes mellitus with hyperglycemia: Secondary | ICD-10-CM | POA: Diagnosis present

## 2021-06-03 DIAGNOSIS — R471 Dysarthria and anarthria: Secondary | ICD-10-CM | POA: Diagnosis present

## 2021-06-03 DIAGNOSIS — I639 Cerebral infarction, unspecified: Secondary | ICD-10-CM | POA: Diagnosis present

## 2021-06-03 DIAGNOSIS — I513 Intracardiac thrombosis, not elsewhere classified: Secondary | ICD-10-CM | POA: Diagnosis not present

## 2021-06-03 DIAGNOSIS — I10 Essential (primary) hypertension: Secondary | ICD-10-CM | POA: Diagnosis not present

## 2021-06-03 DIAGNOSIS — Z7901 Long term (current) use of anticoagulants: Secondary | ICD-10-CM | POA: Diagnosis not present

## 2021-06-03 DIAGNOSIS — Z20822 Contact with and (suspected) exposure to covid-19: Secondary | ICD-10-CM | POA: Diagnosis present

## 2021-06-03 DIAGNOSIS — R4182 Altered mental status, unspecified: Secondary | ICD-10-CM | POA: Diagnosis present

## 2021-06-03 DIAGNOSIS — Z681 Body mass index (BMI) 19 or less, adult: Secondary | ICD-10-CM | POA: Diagnosis not present

## 2021-06-03 DIAGNOSIS — I472 Ventricular tachycardia, unspecified: Secondary | ICD-10-CM | POA: Diagnosis not present

## 2021-06-03 DIAGNOSIS — D539 Nutritional anemia, unspecified: Secondary | ICD-10-CM | POA: Diagnosis present

## 2021-06-03 DIAGNOSIS — Z8249 Family history of ischemic heart disease and other diseases of the circulatory system: Secondary | ICD-10-CM | POA: Diagnosis not present

## 2021-06-03 DIAGNOSIS — F1027 Alcohol dependence with alcohol-induced persisting dementia: Secondary | ICD-10-CM | POA: Diagnosis present

## 2021-06-03 LAB — LIPID PANEL
Cholesterol: 148 mg/dL (ref 0–200)
HDL: 55 mg/dL (ref >40)
LDL Cholesterol: 81 mg/dL (ref 0–99)
Total CHOL/HDL Ratio: 2.7 RATIO
Triglycerides: 60 mg/dL (ref <150)
VLDL: 12 mg/dL (ref 0–40)

## 2021-06-03 LAB — ECHOCARDIOGRAM COMPLETE
AR max vel: 2.55 cm2
AV Area VTI: 3.06 cm2
AV Area mean vel: 2.47 cm2
AV Mean grad: 2 mmHg
AV Peak grad: 4.3 mmHg
Ao pk vel: 1.04 m/s
Area-P 1/2: 2.93 cm2
Height: 73 in
MV VTI: 2.12 cm2
S' Lateral: 2.3 cm
Weight: 2211.65 oz

## 2021-06-03 LAB — BASIC METABOLIC PANEL
Anion gap: 9 (ref 5–15)
BUN: 31 mg/dL — ABNORMAL HIGH (ref 8–23)
CO2: 25 mmol/L (ref 22–32)
Calcium: 9.2 mg/dL (ref 8.9–10.3)
Chloride: 104 mmol/L (ref 98–111)
Creatinine, Ser: 2.01 mg/dL — ABNORMAL HIGH (ref 0.61–1.24)
GFR, Estimated: 34 mL/min — ABNORMAL LOW (ref >60)
Glucose, Bld: 136 mg/dL — ABNORMAL HIGH (ref 70–99)
Potassium: 4.8 mmol/L (ref 3.5–5.1)
Sodium: 138 mmol/L (ref 135–145)

## 2021-06-03 LAB — GLUCOSE, CAPILLARY
Glucose-Capillary: 155 mg/dL — ABNORMAL HIGH (ref 70–99)
Glucose-Capillary: 124 mg/dL — ABNORMAL HIGH (ref 70–99)
Glucose-Capillary: 267 mg/dL — ABNORMAL HIGH (ref 70–99)
Glucose-Capillary: 155 mg/dL — ABNORMAL HIGH (ref 70–99)

## 2021-06-03 LAB — HEMOGLOBIN A1C
Hgb A1c MFr Bld: 8.4 % — ABNORMAL HIGH (ref 4.8–5.6)
Mean Plasma Glucose: 194 mg/dL

## 2021-06-03 LAB — PROTIME-INR
INR: 1.1 (ref 0.8–1.2)
Prothrombin Time: 14.4 seconds (ref 11.4–15.2)

## 2021-06-03 LAB — MAGNESIUM: Magnesium: 1.9 mg/dL (ref 1.7–2.4)

## 2021-06-03 MED ORDER — ATORVASTATIN CALCIUM 40 MG PO TABS
80.0000 mg | ORAL_TABLET | Freq: Every evening | ORAL | Status: DC
  Administered 2021-06-04: 80 mg via ORAL
  Filled 2021-06-03: qty 2

## 2021-06-03 MED ORDER — ENOXAPARIN SODIUM 60 MG/0.6ML IJ SOSY
60.0000 mg | PREFILLED_SYRINGE | Freq: Two times a day (BID) | INTRAMUSCULAR | Status: DC
  Administered 2021-06-03 – 2021-06-04 (×2): 60 mg via SUBCUTANEOUS
  Filled 2021-06-03 (×2): qty 0.6

## 2021-06-03 MED ORDER — CYANOCOBALAMIN 1000 MCG/ML IJ SOLN
1000.0000 ug | Freq: Once | INTRAMUSCULAR | Status: AC
  Administered 2021-06-03: 1000 ug via INTRAMUSCULAR
  Filled 2021-06-03: qty 1

## 2021-06-03 MED ORDER — WARFARIN SODIUM 5 MG PO TABS
5.0000 mg | ORAL_TABLET | Freq: Once | ORAL | Status: AC
  Administered 2021-06-03: 5 mg via ORAL
  Filled 2021-06-03: qty 1

## 2021-06-03 MED ORDER — ENSURE ENLIVE PO LIQD
237.0000 mL | Freq: Two times a day (BID) | ORAL | Status: DC
  Administered 2021-06-03 – 2021-06-05 (×4): 237 mL via ORAL

## 2021-06-03 MED ORDER — GADOBUTROL 1 MMOL/ML IV SOLN
6.5000 mL | Freq: Once | INTRAVENOUS | Status: AC | PRN
  Administered 2021-06-03: 6.5 mL via INTRAVENOUS

## 2021-06-03 MED ORDER — WARFARIN - PHARMACIST DOSING INPATIENT: Freq: Every day | Status: DC

## 2021-06-03 NOTE — Clinical Social Work Note (Signed)
[facility] notification done [phone]

## 2021-06-03 NOTE — Plan of Care (Signed)
  Problem: Acute Rehab OT Goals (only OT should resolve) Goal: Pt. Will Perform Eating Flowsheets (Taken 06/03/2021 1214) Pt Will Perform Eating:  with min assist  with adaptive utensils  bed level Goal: Pt. Will Perform Grooming Flowsheets (Taken 06/03/2021 1214) Pt Will Perform Grooming:  standing  with adaptive equipment  with min assist  with mod assist Goal: Pt. Will Perform Upper Body Dressing Flowsheets (Taken 06/03/2021 1214) Pt Will Perform Upper Body Dressing:  with min guard assist  sitting Goal: Pt. Will Perform Lower Body Dressing Flowsheets (Taken 06/03/2021 1214) Pt Will Perform Lower Body Dressing:  with mod assist  sitting/lateral leans  sit to/from stand  with adaptive equipment Goal: Pt. Will Transfer To Toilet Flowsheets (Taken 06/03/2021 1214) Pt Will Transfer to Toilet:  with min guard assist  stand pivot transfer Goal: Pt/Caregiver Will Perform Home Exercise Program Flowsheets (Taken 06/03/2021 1214) Pt/caregiver will Perform Home Exercise Program:  Increased ROM  Both right and left upper extremity  With minimal assist  Justus Duerr OT, MOT

## 2021-06-03 NOTE — Progress Notes (Signed)
PROGRESS NOTE    Joshua Morales  UXL:[id number] DOB: [birth year] DOA: 06/02/2021 PCP: [facility]     Brief Narrative:  Joshua Morales is a 75 y.o. male with medical history significant of dementia, htn, CKD3b, DM, atrial thrombus, anticoagulant use, and previous CVA's. Per the patient's daughter he was slow to respond the morning of 9/28 and was brought to the ED. Head ct and MRI showed acute multifocal punctate infarcts in bilateral cerebral hemispheres and cerebellum, indicating a cardioembolic source.   Assessment & Plan:   Principal Problem:    Acute CVA (cerebrovascular accident)/Multiple punctate acute/sub infarcts in the   bilateral cerebral hemispheres and cerebellum, indicating a cardioembolic source -currently anticoagulated with Xarelto -currently on home atorvastatin -q4hr neuro checks -repeating tte for atrial thrombus -mra neck in progress   Active Problems:    Atrial thrombus -chronic anticoagulation -repeat tte -telemetry currently    Diabetes mellitus type 2, uncontrolled (HCC) -ssi -heart helath carb modified diet  Right foot wound / blister -right foot lateral calcaneous blister, loose skin overlying a pocket of fluid -heel protection ordered    HTN (hypertension) -Permissive HTN x48 hrs from sx onset goal BP <220/110. PRN labetalol or hydralazine if BP above these parameters. Avoid oral antihypertensives. 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[place]) -home aricept    Previous CVA -anticoagulated with xarelto    CRD (chronic renal disease), stage 3b -current creatinine is 2.01, egfr 34,       DVT prophylaxis: xarelto Code Status: full Family Communication: daughter Disposition Plan:    Consultants:  Neurology Cardiology  Procedures: Ct head wo contrast Mr brain wo contrast Mr angio brain wo contrast Mr angio neck w wo  contrast Tte    Subjective: Patient is seated in bed, no complaints expressed, will answer simple questions but not very conversational  Objective: Vitals:   06/02/21 2200 06/02/21 2342 06/03/21 0231 06/03/21 0654  BP: (!) 164/83 (!) 174/82 (!) 152/70 (!) 178/82  Pulse: 64 64 68 65  Resp: _0 Temp: 98.5 F (36.9 C) 98.4 F (36.9 C) 98 F (36.7 C) 98.4 F (36.9 C)  TempSrc: Oral     SpO2: 100% 100% 100% 100%  Weight:      Height:        Intake/Output Summary (Last 24 hours) at 06/03/2021 1215 Last data filed at 06/03/2021 0500 Gross per 24 hour  Intake 419.65 ml  Output 1200 ml  Net -780.35 ml   Filed Weights   06/02/21 0829 06/02/21 1600  Weight: 55.8 kg 62.7 kg    Examination:  General exam: Appears calm and comfortable   Respiratory system: Clear to auscultation. Respiratory effort normal.  Cardiovascular system: S1 & S2 heard, RRR. No JVD, murmurs, rubs, gallops or clicks.  Gastrointestinal system: Abdomen is nondistended, soft and nontender. No organomegaly or masses felt. Normal bowel sounds heard.  Central nervous system: Alert.  Extremities: he moves all extremities appropriately  Skin: no obvious rashes or lesions, right foot heel blister  Psychiatry: Judgement and insight appear normal. Mood & affect appropriate.     Data Reviewed: I have personally reviewed following labs and imaging studies  CBC: Recent Labs  Lab 06/02/21 0907  WBC 11.5*  NEUTROABS 8.5*  HGB 8.8*  HCT 28.2*  MCV 105.2*  PLT 536   Basic Metabolic Panel: Recent Labs  Lab 06/02/21 0907 06/03/21 0526  NA 136  138  K 5.0 4.8  CL 104 104  CO2 23 25  GLUCOSE 277* 136*  BUN 31* 31*  CREATININE 1.92* 2.01*  CALCIUM 8.9 9.2  MG  --  1.9   GFR: Estimated Creatinine Clearance: 29 mL/min (A) (by C-G formula based on SCr of 2.01 mg/dL (H)). Liver Function Tests: Recent Labs  Lab 06/02/21 0907  AST 26  ALT 35  ALKPHOS 50  BILITOT 0.8  PROT 6.8  ALBUMIN  3.7   No results for input(s): LIPASE, AMYLASE in the last 168 hours. No results for input(s): AMMONIA in the last 168 hours. Coagulation Profile: No results for input(s): INR, PROTIME in the last 168 hours. Cardiac Enzymes: No results for input(s): CKTOTAL, CKMB, CKMBINDEX, TROPONINI in the last 168 hours. BNP (last 3 results) No results for input(s): PROBNP in the last 8760 hours. HbA1C: Recent Labs    06/02/21 0907  HGBA1C 8.4*   CBG: Recent Labs  Lab 06/02/21 0830 06/02/21 1656 06/02/21 2037 06/03/21 0323 06/03/21 0755  GLUCAP 260* 198* 188* 155* 124*   Lipid Profile: Recent Labs    06/03/21 0526  CHOL 148  HDL 55  LDLCALC 81  TRIG 60  CHOLHDL 2.7   Thyroid Function Tests: Recent Labs    06/02/21 0907  TSH 1.323   Anemia Panel: No results for input(s): VITAMINB12, FOLATE, FERRITIN, TIBC, IRON, RETICCTPCT in the last 72 hours. Urine analysis:    Component Value Date/Time   COLORURINE YELLOW 06/02/2021 0839   APPEARANCEUR CLEAR 06/02/2021 0839   LABSPEC 1.014 06/02/2021 0839   PHURINE 5.0 06/02/2021 0839   GLUCOSEU 150 (A) 06/02/2021 0839   HGBUR MODERATE (A) 06/02/2021 0839   BILIRUBINUR NEGATIVE 06/02/2021 0839   KETONESUR NEGATIVE 06/02/2021 0839   PROTEINUR >=300 (A) 06/02/2021 0839   NITRITE NEGATIVE 06/02/2021 0839   LEUKOCYTESUR NEGATIVE 06/02/2021 0839   Sepsis Labs: _0 (procalcitonin:4,lacticidven:4)  ) Recent Results (from the past 240 hour(s))  Resp Panel by RT-PCR (Flu A&B, Covid) Nasopharyngeal Swab     Status: None   Collection Time: 06/02/21  8:44 AM   Specimen: Nasopharyngeal Swab; Nasopharyngeal(NP) swabs in vial transport medium  Result Value Ref Range Status   SARS Coronavirus 2 by RT PCR NEGATIVE NEGATIVE Final    Comment: (NOTE) SARS-CoV-2 target nucleic acids are NOT DETECTED.  The SARS-CoV-2 RNA is generally detectable in upper respiratory specimens during the acute phase of infection. The lowest concentration  of SARS-CoV-2 viral copies this assay can detect is 138 copies/mL. A negative result does not preclude SARS-Cov-2 infection and should not be used as the sole basis for treatment or other patient management decisions. A negative result may occur with  improper specimen collection/handling, submission of specimen other than nasopharyngeal swab, presence of viral mutation(s) within the areas targeted by this assay, and inadequate number of viral copies(<138 copies/mL). A negative result must be combined with clinical observations, patient history, and epidemiological information. The expected result is Negative.  Fact Sheet for Patients:  EntrepreneurPulse.com.au  Fact Sheet for Healthcare Providers:  IncredibleEmployment.be  This test is no t yet approved or cleared by the [place] FDA and  has been authorized for detection and/or diagnosis of SARS-CoV-2 by FDA under an Emergency Use Authorization (EUA). This EUA will remain  in effect (meaning this test can be used) for the duration of the COVID-19 declaration under Section 564(b)(1) of the Act, 21 U.S.C.section 360bbb-3(b)(1), unless the authorization is terminated  or revoked sooner.  Influenza A by PCR NEGATIVE NEGATIVE Final   Influenza B by PCR NEGATIVE NEGATIVE Final    Comment: (NOTE) The Xpert Xpress SARS-CoV-2/FLU/RSV plus assay is intended as an aid in the diagnosis of influenza from Nasopharyngeal swab specimens and should not be used as a sole basis for treatment. Nasal washings and aspirates are unacceptable for Xpert Xpress SARS-CoV-2/FLU/RSV testing.  Fact Sheet for Patients: EntrepreneurPulse.com.au  Fact Sheet for Healthcare Providers: IncredibleEmployment.be  This test is not yet approved or cleared by the [place] FDA and has been authorized for detection and/or diagnosis of SARS-CoV-2 by FDA under an Emergency Use  Authorization (EUA). This EUA will remain in effect (meaning this test can be used) for the duration of the COVID-19 declaration under Section 564(b)(1) of the Act, 21 U.S.C. section 360bbb-3(b)(1), unless the authorization is terminated or revoked.  Performed at [facility], [address], [place], [place] [address]   Culture, blood (routine x 2)     Status: None (Preliminary result)   Collection Time: 06/02/21  9:07 AM   Specimen: Right Antecubital; Blood  Result Value Ref Range Status   Specimen Description RIGHT ANTECUBITAL  Final   Special Requests   Final    BOTTLES DRAWN AEROBIC AND ANAEROBIC Blood Culture adequate volume   Culture   Final    NO GROWTH < 24 HOURS Performed at [facility], [address], [place], [place] [address]    Report Status PENDING  Incomplete  Culture, blood (routine x 2)     Status: None (Preliminary result)   Collection Time: 06/02/21  9:26 AM   Specimen: Left Antecubital; Blood  Result Value Ref Range Status   Specimen Description LEFT ANTECUBITAL  Final   Special Requests   Final    BOTTLES DRAWN AEROBIC AND ANAEROBIC Blood Culture adequate volume   Culture   Final    NO GROWTH < 24 HOURS Performed at [facility], [address], [place], [place] [address]    Report Status PENDING  Incomplete         Radiology Studies: CT Head Wo Contrast  Result Date: 06/02/2021 CLINICAL DATA:  Altered mental status EXAM: CT HEAD WITHOUT CONTRAST TECHNIQUE: Contiguous axial images were obtained from the base of the skull through the vertex without intravenous contrast. COMPARISON:  CT head 05/24/2021, brain MRI 05/25/2021 FINDINGS: Brain: There is no evidence of acute intracranial hemorrhage, extra-axial fluid collection, or acute territorial infarct. There is hypodensity in the right aspect of the splenium of the corpus callosum and left basal ganglia corresponding to the infarcts seen on the prior MRI of 05/25/2021. The other small infarcts seen on that  study are not well delineated by CT, but there is no evidence of hemorrhagic transformation of any of these infarcts. There is unchanged global parenchymal volume loss with commensurate enlargement of the ventricular system. There is confluent hypodensity in the subcortical and periventricular white matter likely reflecting sequela of chronic white matter microangiopathy, unchanged. There is no mass lesion. There is no midline shift. Vascular: There is calcification of the bilateral cavernous ICAs. Skull: Normal. Negative for fracture or focal lesion. Sinuses/Orbits: The imaged paranasal sinuses are clear. A left lens implant is noted. The globes and orbits are otherwise unremarkable. Other: None. IMPRESSION: 1. No acute intracranial hemorrhage or new acute infarct. 2. Hypodensity in the right aspect of the splenium of the corpus callosum and left basal ganglia corresponding to infarcts seen on the prior MRI of 05/25/2021. The other small infarcts seen  on that study are not well delineated on the current study, but there is no evidence of hemorrhagic transformation. Electronically Signed   By: Valetta Mole M.D.   On: 06/02/2021 12:04   MR ANGIO HEAD WO CONTRAST  Result Date: 06/02/2021 CLINICAL DATA:  Bilateral leg swelling, slow to respond started this morning EXAM: MRI HEAD WITHOUT CONTRAST MRA HEAD WITHOUT CONTRAST TECHNIQUE: Multiplanar, multi-echo pulse sequences of the brain and surrounding structures were acquired without intravenous contrast. Angiographic images of the Circle of Willis were acquired using MRA technique without intravenous contrast. COMPARISON:  Same-day noncontrast CT head: Brain MRI 05/25/2021 FINDINGS: MRI HEAD FINDINGS Brain: There are small foci of diffusion restriction in the right parietal and left frontal lobes which were not present on the prior MRI of 05/25/2021, consistent with new small acute infarcts. Additional punctate acute infarct is seen in the right cerebellar  hemisphere. There is ongoing evolution of the additional small infarcts seen on the study from 05/25/2021, with overall decreased diffusion restriction. There is no evidence of acute intracranial hemorrhage. There are punctate chronic microhemorrhages in the left occipital lobe and, left lentiform nucleus, and right aspect of the splenium of the corpus callosum, nonspecific but possibly hypertensive in etiology. SW I signal dropout is also seen in the right caudate head more likely related to prior infarct. There is moderate global parenchymal volume loss with commensurate enlargement of the ventricular system. There is confluent FLAIR signal abnormality in the remainder of the subcortical and periventricular white matter likely reflecting sequela of chronic white matter microangiopathy, not significantly changed in the interim. There is no mass lesion.  There is no midline shift. Vascular: See below Skull and upper cervical spine: Normal marrow signal. Sinuses/Orbits: The paranasal sinuses are clear. A left lens implant is noted. The globes and orbits are otherwise unremarkable. Other: None. MRA HEAD FINDINGS Anterior circulation: There is irregularity of the bilateral intracranial ICAs likely reflecting atherosclerotic disease resulting in mild-to-moderate stenosis bilaterally, worse on the left where there is focal moderate stenosis, similar to the prior study. The bilateral MCAs and ACAs are patent with mild multifocal irregularity. There is no aneurysm. Posterior circulation: The V4 segments of the vertebral arteries are patent. The basilar artery is patent. The bilateral PCAs are patent. Anatomic variants: None. IMPRESSION: 1. Punctate acute infarcts in the right parietal and left frontal lobes and right cerebellar hemisphere are new since 05/25/2021. 2. Ongoing evolution of additional small infarcts seen on the study from 05/25/2021. 3. No evidence of acute intracranial hemorrhage. 4. No new high-grade  stenosis or occlusion of the intracranial vasculature. 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 5. Unchanged parenchymal volume loss and chronic white matter microangiopathy. Electronically Signed   By: Valetta Mole M.D.   On: 06/02/2021 13:22   MR BRAIN WO CONTRAST  Result Date: 06/02/2021 CLINICAL DATA:  Bilateral leg swelling, slow to respond started this morning EXAM: MRI HEAD WITHOUT CONTRAST MRA HEAD WITHOUT CONTRAST TECHNIQUE: Multiplanar, multi-echo pulse sequences of the brain and surrounding structures were acquired without intravenous contrast. Angiographic images of the Circle of Willis were acquired using MRA technique without intravenous contrast. COMPARISON:  Same-day noncontrast CT head: Brain MRI 05/25/2021 FINDINGS: MRI HEAD FINDINGS Brain: There are small foci of diffusion restriction in the right parietal and left frontal lobes which were not present on the prior MRI of 05/25/2021, consistent with new small  acute infarcts. Additional punctate acute infarct is seen in the right cerebellar hemisphere. There is ongoing evolution of the additional small infarcts seen on the study from 05/25/2021, with overall decreased diffusion restriction. There is no evidence of acute intracranial hemorrhage. There are punctate chronic microhemorrhages in the left occipital lobe and, left lentiform nucleus, and right aspect of the splenium of the corpus callosum, nonspecific but possibly hypertensive in etiology. SW I signal dropout is also seen in the right caudate head more likely related to prior infarct. There is moderate global parenchymal volume loss with commensurate enlargement of the ventricular system. There is confluent FLAIR signal abnormality in the remainder of the subcortical and periventricular white matter likely reflecting sequela of chronic white matter  microangiopathy, not significantly changed in the interim. There is no mass lesion.  There is no midline shift. Vascular: See below Skull and upper cervical spine: Normal marrow signal. Sinuses/Orbits: The paranasal sinuses are clear. A left lens implant is noted. The globes and orbits are otherwise unremarkable. Other: None. MRA HEAD FINDINGS Anterior circulation: There is irregularity of the bilateral intracranial ICAs likely reflecting atherosclerotic disease resulting in mild-to-moderate stenosis bilaterally, worse on the left where there is focal moderate stenosis, similar to the prior study. The bilateral MCAs and ACAs are patent with mild multifocal irregularity. There is no aneurysm. Posterior circulation: The V4 segments of the vertebral arteries are patent. The basilar artery is patent. The bilateral PCAs are patent. Anatomic variants: None. IMPRESSION: 1. Punctate acute infarcts in the right parietal and left frontal lobes and right cerebellar hemisphere are new since 05/25/2021. 2. Ongoing evolution of additional small infarcts seen on the study from 05/25/2021. 3. No evidence of acute intracranial hemorrhage. 4. No new high-grade stenosis or occlusion of the intracranial vasculature. 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 5. Unchanged parenchymal volume loss and chronic white matter microangiopathy. Electronically Signed   By: Valetta Mole M.D.   On: 06/02/2021 13:22   DG Chest Port 1 View  Result Date: 06/02/2021 CLINICAL DATA:  75 year old male with history of bilateral leg swelling and altered mental status. Weakness. EXAM: PORTABLE CHEST 1 VIEW COMPARISON:  Chest x-ray 04/05/2021. FINDINGS: Lung volumes are increased with emphysematous changes. No consolidative airspace disease. No pleural effusions. No pneumothorax. No pulmonary nodule or mass noted. Pulmonary  vasculature and the cardiomediastinal silhouette are within normal limits. Surgical clips project over the left hemithorax. Orthopedic fixation hardware in the lower cervical spine incompletely imaged. IMPRESSION: 1. No radiographic evidence of acute cardiopulmonary disease. 2. Emphysema. Electronically Signed   By: Vinnie Langton M.D.   On: 06/02/2021 09:18   DG Foot Complete Right  Result Date: 06/02/2021 CLINICAL DATA:  rt foot wound heel EXAM: RIGHT FOOT COMPLETE - 3 VIEW COMPARISON:  None. FINDINGS: Normal alignment. No acute fracture. Ossification of the distal interosseous membrane. Osteopenia. Vascular calcifications. IMPRESSION: No malalignment or acute osseous abnormality. Vascular calcifications. Electronically Signed   By: Albin Felling M.D.   On: 06/02/2021 09:29        Scheduled Meds:  atorvastatin  40 mg Oral Daily   cholecalciferol  2,000 Units Oral Daily   donepezil  5 mg Oral QHS   folic acid  1 mg Oral Daily   insulin aspart  0-5 Units Subcutaneous QHS   insulin aspart  0-9 Units Subcutaneous TID WC   insulin aspart  4 Units  Subcutaneous TID WC   insulin detemir  8 Units Subcutaneous Daily   memantine  5 mg Oral BID   mirtazapine  15 mg Oral QHS   multivitamin with minerals  1 tablet Oral Daily   QUEtiapine  25 mg Oral QHS   Rivaroxaban  15 mg Oral Q supper   thiamine  100 mg Oral Daily   Continuous Infusions:  sodium chloride 45 mL/hr at 06/02/21 1650     LOS: 0 days    Time spent:     Ellie Lunch, student [facility] Pager [phone]  If 7PM-7AM, please contact night-coverage [email or web address] Password [facility] 06/03/2021, 12:15 PM

## 2021-06-03 NOTE — Progress Notes (Addendum)
Initial Nutrition Assessment  DOCUMENTATION CODES:   Non-severe (moderate) malnutrition in context of chronic illness  INTERVENTION:  Ensure Enlive po BID, each supplement provides 350 kcal and 20 grams of protein   Encourage meal and oral supplement intake to improve nutrition status and support muscle maintenance as pt works with therapies (PT/OT)  NUTRITION DIAGNOSIS:   Moderate Malnutrition related to chronic illness (dementia, CKD-3) as evidenced by moderate fat depletion, moderate muscle depletion, mild fat depletion, mild muscle depletion.   GOAL:  Patient will meet greater than or equal to 90% of their needs   MONITOR:  PO intake, Supplement acceptance, Labs, Weight trends, Skin  REASON FOR ASSESSMENT:   Malnutrition Screening Tool    ASSESSMENT: patient is an underweight 75 yo male with hx of  DM2, Dementia, CKD 3b, Macrocytic anemia and HTN. He presents with acute CVA.   Patient is up in chair and lunch tray is here. PO:100% of meal consumed. Patient minimally responsive to questioning. Patient fed by staff. He is working with OT and PT.  Patient able only to take a few side steps (4 ft) this morning per PT.  Weight review - suggest gain of 12% this past month. Recommend reweigh. During August and most of September patient has been maintaining a weight of 56 kg. Noted below. Pitting edema BLE per nursing likely skewed initial weight.  8/1-56.7 kg 8/31-56 kg 9/19- 55.8 kg 9/28 62.7 kg  Medications reviewed and include: Vitamin D3, Folic acid, Insulin (long and short acting), Namenda and Remeron, thiamine.  IVF- NS @ 45 ml/hr   Labs:  BMP Latest Ref Rng & Units 06/03/2021 06/02/2021 05/26/2021  Glucose 70 - 99 mg/dL 244(W) 102(V) 253(G)  BUN 8 - 23 mg/dL 64(Q) 03(K) 74(Q)  Creatinine 0.61 - 1.24 mg/dL 5.95(G) 3.87(F) 6.43(P)  Sodium 135 - 145 mmol/L 138 136 137  Potassium 3.5 - 5.1 mmol/L 4.8 5.0 4.2  Chloride 98 - 111 mmol/L 104 104 108  CO2 22 - 32 mmol/L 25  23 22   Calcium 8.9 - 10.3 mg/dL 9.2 8.9 )    NUTRITION - FOCUSED PHYSICAL EXAM:  Flowsheet Row Most Recent Value  Orbital Region Mild depletion  Upper Arm Region Moderate depletion  Buccal Region Mild depletion  Temple Region Moderate depletion  Clavicle Bone Region Moderate depletion  Clavicle and Acromion Bone Region Mild depletion  Scapular Bone Region Moderate depletion  Dorsal Hand Mild depletion  Patellar Region Severe depletion  Anterior Thigh Region Moderate depletion  Posterior Calf Region Moderate depletion  Edema (RD Assessment) Mild  Hair Reviewed  Eyes Reviewed  Mouth Reviewed  Skin Reviewed  Nails Reviewed      Diet Order:   Diet Order             DIET DYS 3 Room service appropriate? Yes; Fluid consistency: Thin  Diet effective now                   EDUCATION NEEDS:  Not appropriate for education at this time  Skin:  Skin Assessment: Reviewed RN Assessment  Last BM:  9/29 type 6  Height:   Ht Readings from Last 1 Encounters:  06/02/21 6\' 1"  (1.854 m)    Weight:   Wt Readings from Last 1 Encounters:  06/02/21 62.7 kg    Ideal Body Weight:   84 kg  BMI:  Body mass index is 18.24 kg/m.  Estimated Nutritional Needs: based on UBW: 56 kg  Kcal:   Protein:  90-95 gr  Fluid:  1.9 liters daily   Royann Shivers MS,RD,CSG,LDN Contact: Amion

## 2021-06-03 NOTE — Progress Notes (Signed)
*  PRELIMINARY RESULTS* Echocardiogram 2D Echocardiogram has been performed.  Joshua Morales 06/03/2021, 4:16 PM

## 2021-06-03 NOTE — Consult Note (Signed)
[facility] Joshua Morales A. Gerilyn Pilgrim, MD     [email or web address]          Joshua Morales is an 75 y.o. male.   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 This is concerning.  The patient is currently being transitioned over to warfarin. Another consideration includes full dose of Xarelto 20 mg despite his elevated creatinine. An EEG will be obtained given the semiology suggestive of complex partial seizures.  Vascular dementia:  Additional labs for dementia will be obtained  Atrial thrombus documented with TEE August 2022  Hypertension  Diabetes mellitus      The patient lives with his wife and daughter. The history is obtained from 1 of his daughters. She reports that the patient had another spell of unresponsiveness. She tells me this semiology is that he becomes unresponsive and and start staring. This last a few minutes. She does report that he tends to have weakness with the spells especially on the right side although sometimes on the left side. Patient then comes out of it after few minutes and is back to baseline. He does have some cognitive impairment from dementia. There are no reports of palpitation, dyspnea or chest pain. The review systems is limited but otherwise unrevealing.   GENERAL:  This is a thin man who is resting well. He is somewhat under nourished.  HEENT:  Neck is supple no trauma noted.  ABDOMEN: soft  EXTREMITIES: No edema   BACK:  Normal  SKIN:  Hyperpigmented macules involving the lower extremities bilaterally.    MENTAL STATUS:  he is drowsy but easily arousable. He does follow commands. He thinks he is at home and identifies his daughter by name but thinks that she is his wife. Speech is slightly dysarthric. He is not oriented to time or his age.  CRANIAL NERVES: Pupils are equal, round and reactive to light; extra ocular movements are  full, there is no significant nystagmus; visual fields are full; upper and lower facial muscles are normal in strength and symmetric, there is no flattening of the nasolabial folds; tongue is midline.  MOTOR:  Has 4/5 strength in the extremities. No drift noted.  COORDINATION: Left finger to nose is normal, right finger to nose is normal, No rest tremor; no intention tremor; no postural tremor; no bradykinesia.  REFLEXES: Deep tendon reflexes are symmetrical and normal.   SENSATION: Normal to pain.    Blood pressure (!) 176/107, pulse 73, temperature 98.8 F (37.1 C), temperature source Oral, resp. rate 17, height 6\' 1"  (1.854 m), weight 62.7 kg, SpO2 93 %.  Past Medical History:  Diagnosis Date   Agent orange exposure    Atrial thrombus 05/05/2021   CRD (chronic renal disease), stage 3b (HCC) 05/05/2021   Dementia (HCC)    Diabetes mellitus without complication (HCC)    Hypertension    Macrocytic anemia 05/05/2021    Past Surgical History:  Procedure Laterality Date   BUBBLE STUDY  04/07/2021   Procedure: BUBBLE STUDY;  Surgeon: 06/07/2021, MD;  Location: [facility];  Service: Cardiovascular;;  with TEE   LUNG REMOVAL, PARTIAL     TEE WITHOUT CARDIOVERSION N/A 04/07/2021   Procedure: TRANSESOPHAGEAL ECHOCARDIOGRAM (TEE);  Surgeon: 06/07/2021, MD;  Location: [facility];  Service: Cardiovascular;  Laterality: N/A;   TOE AMPUTATION      Family History  Problem Relation Age of Onset   Congestive Heart Failure Mother  Diabetes Mellitus I Mother    Dementia Father    Throat cancer Brother     Social History:  reports that he has been smoking cigarettes. He does not have any smokeless tobacco history on file. He reports current alcohol use. He reports that he does not use drugs.  Allergies: No Known Allergies  Medications: Prior to Admission medications   Medication Sig Start Date End Date Taking? Authorizing Provider  ACIDOPHILUS LACTOBACILLUS PO Take 2 capsules by  mouth in the morning and at bedtime.   Yes [provider]  atenolol (TENORMIN) 25 MG tablet Take 1 tablet (25 mg total) by mouth daily. 04/08/21  Yes Johnson, Clanford L, MD  atorvastatin (LIPITOR) 40 MG tablet Take 40 mg by mouth daily.   Yes [provider]  Cholecalciferol (D3-1000) 25 MCG (1000 UT) tablet Take 2,000 Units by mouth daily.   Yes [provider]  donepezil (ARICEPT) 5 MG tablet Take 1 tablet (5 mg total) by mouth at bedtime. 04/08/21  Yes Johnson, Clanford L, MD  folic acid (FOLVITE) 1 MG tablet Take 1 tablet (1 mg total) by mouth daily. 04/08/21  Yes Johnson, Clanford L, MD  hydrOXYzine (ATARAX/VISTARIL) 10 MG tablet Take 10 mg by mouth 2 (two) times daily as needed for itching.   Yes [provider]  insulin aspart (NOVOLOG) 100 UNIT/ML FlexPen Inject 2 Units into the skin 3 (three) times daily with meals. IF EATS 50% OR MORE OF MEAL. Patient taking differently: Inject 4 Units into the skin 3 (three) times daily with meals. IF EATS 50% OR MORE OF MEAL. 04/08/21  Yes Johnson, Clanford L, MD  insulin detemir (LEVEMIR) 100 UNIT/ML FlexPen Inject 8 Units into the skin daily.   Yes [provider]  Insulin Pen Needle 31G X 5 MM MISC 1 Device by Does not apply route as directed. 04/08/21  Yes Johnson, Clanford L, MD  memantine (NAMENDA) 5 MG tablet Take 1 tablet (5 mg total) by mouth 2 (two) times daily. 04/08/21  Yes Johnson, Clanford L, MD  mirtazapine (REMERON) 15 MG tablet Take 15 mg by mouth at bedtime. 08/07/20  Yes [provider]  Multiple Vitamin (MULTIVITAMIN WITH MINERALS) TABS tablet Take 1 tablet by mouth daily. 04/08/21  Yes Johnson, Clanford L, MD  QUEtiapine (SEROQUEL) 25 MG tablet Take 1 tablet (25 mg total) by mouth at bedtime. 04/08/21  Yes Johnson, Clanford L, MD  Rivaroxaban (XARELTO) 15 MG TABS tablet Take 1 tablet (15 mg total) by mouth daily with supper. 05/27/21  Yes Shon Hale, MD  thiamine 100 MG tablet Take 1 tablet  (100 mg total) by mouth daily. 04/08/21  Yes Johnson, Clanford L, MD  hydrALAZINE (APRESOLINE) 50 MG tablet Take 1 tablet (50 mg total) by mouth 3 (three) times daily. Patient not taking: Reported on 06/02/2021 05/27/21   Shon Hale, MD  polyethylene glycol (MIRALAX / GLYCOLAX) 17 g packet Take 17 g by mouth daily as needed for mild constipation. Patient not taking: Reported on 06/02/2021 04/08/21   Cleora Fleet, MD    Scheduled Meds:  [START ON 06/04/2021] atorvastatin  80 mg Oral QPM   cholecalciferol  2,000 Units Oral Daily   donepezil  5 mg Oral QHS   enoxaparin (LOVENOX) injection  60 mg Subcutaneous Q12H   feeding supplement  237 mL Oral BID BM   folic acid  1 mg Oral Daily   insulin aspart  0-5 Units Subcutaneous QHS   insulin aspart  0-9 Units  Subcutaneous TID WC   insulin aspart  4 Units Subcutaneous TID WC   insulin detemir  8 Units Subcutaneous Daily   memantine  5 mg Oral BID   mirtazapine  15 mg Oral QHS   multivitamin with minerals  1 tablet Oral Daily   QUEtiapine  25 mg Oral QHS   thiamine  100 mg Oral Daily   [START ON 06/04/2021] Warfarin - Pharmacist Dosing Inpatient   Does not apply q1600   Continuous Infusions:  sodium chloride 30 mL/hr at 06/03/21 1826   PRN Meds:.acetaminophen **OR** acetaminophen (TYLENOL) oral liquid 160 mg/5 mL **OR** acetaminophen, hydrOXYzine, senna-docusate     Results for orders placed or performed during the hospital encounter of 06/02/21 (from the past 48 hour(s))  CBG monitoring, ED     Status: Abnormal   Collection Time: 06/02/21  8:30 AM  Result Value Ref Range   Glucose-Capillary 260 (H) 70 - 99 mg/dL    Comment: Glucose reference range applies only to samples taken after fasting for at least 8 hours.  Urinalysis, Routine w reflex microscopic Urine, Clean Catch     Status: Abnormal   Collection Time: 06/02/21  8:39 AM  Result Value Ref Range   Color, Urine YELLOW YELLOW   APPearance CLEAR CLEAR   Specific Gravity,  Urine 1.014 1.005 - 1.030   pH 5.0 5.0 - 8.0   Glucose, UA 150 (A) NEGATIVE mg/dL   Hgb urine dipstick MODERATE (A) NEGATIVE   Bilirubin Urine NEGATIVE NEGATIVE   Ketones, ur NEGATIVE NEGATIVE mg/dL   Protein, ur >=751 (A) NEGATIVE mg/dL   Nitrite NEGATIVE NEGATIVE   Leukocytes,Ua NEGATIVE NEGATIVE   RBC / HPF 0-5 0 - 5 RBC/hpf   WBC, UA 0-5 0 - 5 WBC/hpf   Bacteria, UA NONE SEEN NONE SEEN    Comment: Performed at [facility], [address] [place], [place] [address]  Resp Panel by RT-PCR (Flu A&B, Covid) Nasopharyngeal Swab     Status: None   Collection Time: 06/02/21  8:44 AM   Specimen: Nasopharyngeal Swab; Nasopharyngeal(NP) swabs in vial transport medium  Result Value Ref Range   SARS Coronavirus 2 by RT PCR NEGATIVE NEGATIVE    Comment: (NOTE) SARS-CoV-2 target nucleic acids are NOT DETECTED.  The SARS-CoV-2 RNA is generally detectable in upper respiratory specimens during the acute phase of infection. The lowest concentration of SARS-CoV-2 viral copies this assay can detect is 138 copies/mL. A negative result does not preclude SARS-Cov-2 infection and should not be used as the sole basis for treatment or other patient management decisions. A negative result may occur with  improper specimen collection/handling, submission of specimen other than nasopharyngeal swab, presence of viral mutation(s) within the areas targeted by this assay, and inadequate number of viral copies(<138 copies/mL). A negative result must be combined with clinical observations, patient history, and epidemiological information. The expected result is Negative.  Fact Sheet for Patients:  BloggerCourse.com  Fact Sheet for Healthcare Providers:  SeriousBroker.it  This test is no t yet approved or cleared by the [place] FDA and  has been authorized for detection and/or diagnosis of SARS-CoV-2 by FDA under an Emergency Use Authorization (EUA).  This EUA will remain  in effect (meaning this test can be used) for the duration of the COVID-19 declaration under Section 564(b)(1) of the Act, 21 U.S.C.section 360bbb-3(b)(1), unless the authorization is terminated  or revoked sooner.       Influenza A by PCR NEGATIVE NEGATIVE   Influenza B  by PCR NEGATIVE NEGATIVE    Comment: (NOTE) The Xpert Xpress SARS-CoV-2/FLU/RSV plus assay is intended as an aid in the diagnosis of influenza from Nasopharyngeal swab specimens and should not be used as a sole basis for treatment. Nasal washings and aspirates are unacceptable for Xpert Xpress SARS-CoV-2/FLU/RSV testing.  Fact Sheet for Patients: BloggerCourse.com  Fact Sheet for Healthcare Providers: SeriousBroker.it  This test is not yet approved or cleared by the [place] FDA and has been authorized for detection and/or diagnosis of SARS-CoV-2 by FDA under an Emergency Use Authorization (EUA). This EUA will remain in effect (meaning this test can be used) for the duration of the COVID-19 declaration under Section 564(b)(1) of the Act, 21 U.S.C. section 360bbb-3(b)(1), unless the authorization is terminated or revoked.  Performed at [facility], [address] [place], [place] [address]   Comprehensive metabolic panel     Status: Abnormal   Collection Time: 06/02/21  9:07 AM  Result Value Ref Range   Sodium 136 135 - 145 mmol/L   Potassium 5.0 3.5 - 5.1 mmol/L   Chloride 104 98 - 111 mmol/L   CO2 23 22 - 32 mmol/L   Glucose, Bld 277 (H) 70 - 99 mg/dL    Comment: Glucose reference range applies only to samples taken after fasting for at least 8 hours.   BUN 31 (H) 8 - 23 mg/dL   Creatinine, Ser 2.24 (H) 0.61 - 1.24 mg/dL   Calcium 8.9 8.9 - 49.7 mg/dL   Total Protein 6.8 6.5 - 8.1 g/dL   Albumin 3.7 3.5 - 5.0 g/dL   AST 26 15 - 41 U/L   ALT 35 0 - 44 U/L   Alkaline Phosphatase 50 38 - 126 U/L   Total Bilirubin 0.8 0.3 - 1.2  mg/dL   GFR, Estimated 36 (L) >60 mL/min    Comment: (NOTE) Calculated using the CKD-EPI Creatinine Equation (2021)    Anion gap 9 5 - 15    Comment: Performed at [facility], [address] [place], [place] [address]  Troponin I (High Sensitivity)     Status: Abnormal   Collection Time: 06/02/21  9:07 AM  Result Value Ref Range   Troponin I (High Sensitivity) 238 (HH) <18 ng/L    Comment: CRITICAL RESULT CALLED TO, READ BACK BY AND VERIFIED WITH: ALSTON,C AT 9:50AM ON 06/02/21 BY FESTERMAN,C (NOTE) Elevated high sensitivity troponin I (hsTnI) values and significant  changes across serial measurements may suggest ACS but many other  chronic and acute conditions are known to elevate hsTnI results.  Refer to the Links section for chest pain algorithms and additional  guidance. Performed at [facility], [address], [place], [place] [address]   CBC with Differential     Status: Abnormal   Collection Time: 06/02/21  9:07 AM  Result Value Ref Range   WBC 11.5 (H) 4.0 - 10.5 K/uL   RBC 2.68 (L) 4.22 - 5.81 MIL/uL   Hemoglobin 8.8 (L) 13.0 - 17.0 g/dL   HCT 11.7 (L) 35.6 - 70.1 %   MCV 105.2 (H) 80.0 - 100.0 fL   MCH 32.8 26.0 - 34.0 pg   MCHC 31.2 30.0 - 36.0 g/dL   RDW 41.0 30.1 - 31.4 %   Platelets 164 150 - 400 K/uL   nRBC 0.0 0.0 - 0.2 %   Neutrophils Relative % 74 %   Neutro Abs 8.5 (H) 1.7 - 7.7 K/uL   Lymphocytes Relative 17 %   Lymphs Abs  2.0 0.7 - 4.0 K/uL   Monocytes Relative 7 %   Monocytes Absolute 0.7 0.1 - 1.0 K/uL   Eosinophils Relative 1 %   Eosinophils Absolute 0.1 0.0 - 0.5 K/uL   Basophils Relative 0 %   Basophils Absolute 0.1 0.0 - 0.1 K/uL   Immature Granulocytes 1 %   Abs Immature Granulocytes 0.06 0.00 - 0.07 K/uL    Comment: Performed at [facility], [address], [place], [place] [address]  Culture, blood (routine x 2)     Status: None (Preliminary result)   Collection Time: 06/02/21  9:07 AM   Specimen: Right Antecubital; Blood  Result Value  Ref Range   Specimen Description RIGHT ANTECUBITAL    Special Requests      BOTTLES DRAWN AEROBIC AND ANAEROBIC Blood Culture adequate volume   Culture      NO GROWTH < 24 HOURS Performed at [facility], [address] [place], [place] [address]    Report Status PENDING   Ethanol     Status: None   Collection Time: 06/02/21  9:07 AM  Result Value Ref Range   Alcohol, Ethyl (B) <10 <10 mg/dL    Comment: (NOTE) Lowest detectable limit for serum alcohol is 10 mg/dL.  For medical purposes only. Performed at [facility], [address], [place], [place] [address]   TSH     Status: None   Collection Time: 06/02/21  9:07 AM  Result Value Ref Range   TSH 1.323 0.350 - 4.500 uIU/mL    Comment: Performed by a 3rd Generation assay with a functional sensitivity of <=0.01 uIU/mL. Performed at [facility], [address], [place], [place] [address]   Hemoglobin A1c     Status: Abnormal   Collection Time: 06/02/21  9:07 AM  Result Value Ref Range   Hgb A1c MFr Bld 8.4 (H) 4.8 - 5.6 %    Comment: (NOTE)         Prediabetes: 5.7 - 6.4         Diabetes: >6.4         Glycemic control for adults with diabetes: <7.0    Mean Plasma Glucose 194 mg/dL    Comment: (NOTE) Performed At: [facility] [address] [place], [place] [id number] Jolene Schimke MD NU:[id number]   Lactic acid, plasma     Status: None   Collection Time: 06/02/21  9:26 AM  Result Value Ref Range   Lactic Acid, Venous 1.6 0.5 - 1.9 mmol/L    Comment: Performed at [facility], [address], [place], [place] [address]  Culture, blood (routine x 2)     Status: None (Preliminary result)   Collection Time: 06/02/21  9:26 AM   Specimen: Left Antecubital; Blood  Result Value Ref Range   Specimen Description LEFT ANTECUBITAL    Special Requests      BOTTLES DRAWN AEROBIC AND ANAEROBIC Blood Culture adequate volume   Culture      NO GROWTH < 24 HOURS Performed at [facility], [address], [place], [place]  [address]    Report Status PENDING   Troponin I (High Sensitivity)     Status: Abnormal   Collection Time: 06/02/21 10:41 AM  Result Value Ref Range   Troponin I (High Sensitivity) 236 (HH) <18 ng/L    Comment: CRITICAL VALUE NOTED.  VALUE IS CONSISTENT WITH PREVIOUSLY REPORTED AND CALLED VALUE. (NOTE) Elevated high sensitivity troponin I (hsTnI) values and significant  changes across serial measurements may suggest ACS  but many other  chronic and acute conditions are known to elevate hsTnI results.  Refer to the Links section for chest pain algorithms and additional  guidance. Performed at [facility], [address], [place], [place] [address]   Glucose, capillary     Status: Abnormal   Collection Time: 06/02/21  4:56 PM  Result Value Ref Range   Glucose-Capillary 198 (H) 70 - 99 mg/dL    Comment: Glucose reference range applies only to samples taken after fasting for at least 8 hours.  Glucose, capillary     Status: Abnormal   Collection Time: 06/02/21  8:37 PM  Result Value Ref Range   Glucose-Capillary 188 (H) 70 - 99 mg/dL    Comment: Glucose reference range applies only to samples taken after fasting for at least 8 hours.   Comment 1 Notify RN    Comment 2 Document in Chart   Glucose, capillary     Status: Abnormal   Collection Time: 06/03/21  3:23 AM  Result Value Ref Range   Glucose-Capillary 155 (H) 70 - 99 mg/dL    Comment: Glucose reference range applies only to samples taken after fasting for at least 8 hours.  Lipid panel     Status: None   Collection Time: 06/03/21  5:26 AM  Result Value Ref Range   Cholesterol 148 0 - 200 mg/dL   Triglycerides 60 <914 mg/dL   HDL 55 >78 mg/dL   Total CHOL/HDL Ratio 2.7 RATIO   VLDL 12 0 - 40 mg/dL   LDL Cholesterol 81 0 - 99 mg/dL    Comment:        Total Cholesterol/HDL:CHD Risk Coronary Heart Disease Risk Table                     Men   Women  1/2 Average Risk   3.4   3.3  Average Risk       5.0   4.4  2 X Average Risk    9.6   7.1  3 X Average Risk  23.4   11.0        Use the calculated Patient Ratio above and the CHD Risk Table to determine the patient's CHD Risk.        ATP III CLASSIFICATION (LDL):  <100     mg/dL   Optimal  295-621  mg/dL   Near or Above                    Optimal  130-159  mg/dL   Borderline  308-657  mg/dL   High  >846     mg/dL   Very High Performed at [facility], [address], [place], [place] [address]   Basic metabolic panel     Status: Abnormal   Collection Time: 06/03/21  5:26 AM  Result Value Ref Range   Sodium 138 135 - 145 mmol/L   Potassium 4.8 3.5 - 5.1 mmol/L   Chloride 104 98 - 111 mmol/L   CO2 25 22 - 32 mmol/L   Glucose, Bld 136 (H) 70 - 99 mg/dL    Comment: Glucose reference range applies only to samples taken after fasting for at least 8 hours.   BUN 31 (H) 8 - 23 mg/dL   Creatinine, Ser 2.84 (H) 0.61 - 1.24 mg/dL   Calcium 9.2 8.9 - 13.2 mg/dL   GFR, Estimated 34 (L) >60 mL/min    Comment: (NOTE) Calculated using the CKD-EPI Creatinine Equation (  2021)    Anion gap 9 5 - 15    Comment: Performed at [facility], [address], [place], [place] [address]  Magnesium     Status: None   Collection Time: 06/03/21  5:26 AM  Result Value Ref Range   Magnesium 1.9 1.7 - 2.4 mg/dL    Comment: Performed at [facility], [address] [place], [place] [address]  Glucose, capillary     Status: Abnormal   Collection Time: 06/03/21  7:55 AM  Result Value Ref Range   Glucose-Capillary 124 (H) 70 - 99 mg/dL    Comment: Glucose reference range applies only to samples taken after fasting for at least 8 hours.  Protime-INR     Status: None   Collection Time: 06/03/21  2:19 PM  Result Value Ref Range   Prothrombin Time 14.4 11.4 - 15.2 seconds   INR 1.1 0.8 - 1.2    Comment: (NOTE) INR goal varies based on device and disease states. Performed at [facility], [address], [place], [place] [address]   Glucose, capillary     Status: Abnormal   Collection  Time: 06/03/21  5:19 PM  Result Value Ref Range   Glucose-Capillary 267 (H) 70 - 99 mg/dL    Comment: Glucose reference range applies only to samples taken after fasting for at least 8 hours.    Studies/Results:  MRI/MRA  BRAIN IMPRESSION: 1. Punctate acute infarcts in the right parietal and left frontal lobes and right cerebellar hemisphere are new since 05/25/2021. 2. Ongoing evolution of additional small infarcts seen on the study from 05/25/2021. 3. No evidence of acute intracranial hemorrhage. 4. No new high-grade stenosis or occlusion of the intracranial vasculature. 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 5. Unchanged parenchymal volume loss and chronic white matter microangiopathy.      MRA NECK IMPRESSION: 1. Limited study, as described above. 2. No hemodynamically significant stenosis of the major neck arteries.    Chijioke Lasser A. Gerilyn Pilgrim, M.D.  Diplomate, Biomedical engineer of Psychiatry and Neurology ( Neurology). 06/03/2021, 6:40 PM

## 2021-06-03 NOTE — Plan of Care (Signed)
  Problem: Acute Rehab PT Goals(only PT should resolve) Goal: Pt Will Go Supine/Side To Sit Outcome: Progressing Flowsheets (Taken 06/03/2021 1157) Pt will go Supine/Side to Sit: with minimal assist Goal: Patient Will Transfer Sit To/From Stand Outcome: Progressing Flowsheets (Taken 06/03/2021 1157) Patient will transfer sit to/from stand: with minimal assist Goal: Pt Will Transfer Bed To Chair/Chair To Bed Outcome: Progressing Flowsheets (Taken 06/03/2021 1157) Pt will Transfer Bed to Chair/Chair to Bed: with min assist Goal: Pt Will Ambulate Outcome: Progressing Flowsheets (Taken 06/03/2021 1157) Pt will Ambulate:  25 feet  with minimal assist  with moderate assist  with rolling walker   11:58 AM, 06/03/21 [name], MPT Physical Therapist with [facility] [phone] office [phone] mobile phone

## 2021-06-03 NOTE — Evaluation (Signed)
Occupational Therapy Evaluation Patient Details Name: Joshua Morales MRN: [id number] DOB: [birth year] Today's Date: 06/03/2021   History of Present Illness Joshua Morales is a 75 y.o. male with medical history significant of dementia, htn, CKD3b, DM, atrial thrombus, anticoagulant use, and previous CVA's. Per the patient's daughter he was slow to respond this morning and has bilateral lower extremity edema limited to the ankles and feet. He is hypertensive, 190/73, hyperglycemic 277, slightly bradycardic with an occasional irregularity, ekg consistent with his previous hospitalization, sinus rhythm rate of 55, without st-segment changes. Recently he had a holter monitor after his most recent cva. A brief 4 beat run of vtach is reported, a four beat run of svt.   Clinical Impression    Pt agreeable to OT/PT co-evaluation. Pt demonstrated limited expressive communication this date. Pt demonstrates B UE limitations in shoulder A/ROM and P/ROM. Pt required mod A for stand pivot transfer and mod to max A for supine to sit bed mobility. Pt demonstrates slow labored movement and need for assist to complete peri-care while standing with PT. Pt will benefit from continued OT in the hospital and recommended venue below to increase strength, balance, and endurance for safe ADL's.        Recommendations for follow up therapy are one component of a multi-disciplinary discharge planning process, led by the attending physician.  Recommendations may be updated based on patient status, additional functional criteria and insurance authorization.   Follow Up Recommendations  SNF    Equipment Recommendations  None recommended by OT           Precautions / Restrictions Precautions Precautions: Fall Restrictions Weight Bearing Restrictions: No      Mobility Bed Mobility Overal bed mobility: Needs Assistance Bed Mobility: Supine to Sit     Supine to sit: Mod assist;Max assist     General bed  mobility comments: increased time, labored movement    Transfers Overall transfer level: Needs assistance Equipment used: Rolling walker (2 wheeled) Transfers: Sit to/from [employer] Sit to Stand: Mod assist Stand pivot transfers: Mod assist       General transfer comment: slow labored unsteady movement    Balance Overall balance assessment: Needs assistance Sitting-balance support: Feet supported;No upper extremity supported Sitting balance-Leahy Scale: Fair Sitting balance - Comments: seated at EOB   Standing balance support: During functional activity;Bilateral upper extremity supported Standing balance-Leahy Scale: Poor Standing balance comment: using RW                           ADL either performed or assessed with clinical judgement   ADL Overall ADL's : Needs assistance/impaired                     Lower Body Dressing: Bed level;Total assistance Lower Body Dressing Details (indicate cue type and reason): Total assist to adjust sock at bed level. Toilet Transfer: Moderate assistance;RW;Stand-pivot Toilet Transfer Details (indicate cue type and reason): Simulated via EOB to chair transfer with RW. Toileting- Clothing Manipulation and Hygiene: Maximal assistance;Sit to/from stand Toileting - Clothing Manipulation Details (indicate cue type and reason): Pt was able to stand with therapist assisted while per-care was completed for him by this therapist.             Vision Baseline Vision/History:  (Difficulty to obtain visual history due to pt communication deficits.) Vision Assessment?: No apparent visual deficits (Per observation during mobility and trasnfers.)  Pertinent Vitals/Pain Pain Assessment: No/denies pain Pain Score: 0-No pain     Hand Dominance Right   Extremity/Trunk Assessment Upper Extremity Assessment Upper Extremity Assessment: Defer to OT evaluation RUE Deficits / Details: Limited to  ~90* A/ROM and P/ROm for shoulder flexion. Pt observed to bear weight on R UE during transfer. LUE Deficits / Details: Limited to ~90* A/ROM for shoulder flexion. ~75% available P/ROM for shoulder flexion. Pt able to bear weight through L UE during trasnfers.   Lower Extremity Assessment Lower Extremity Assessment: Generalized weakness;RLE deficits/detail RLE Deficits / Details: grossly 3+/5 RLE Sensation: decreased light touch RLE Coordination: decreased gross motor   Cervical / Trunk Assessment Cervical / Trunk Assessment: Kyphotic   Communication Communication Communication: Expressive difficulties   Cognition Arousal/Alertness: Awake/alert Behavior During Therapy: Flat affect Overall Cognitive Status: History of cognitive impairments - at baseline                                 General Comments: Pt remained quiet for majority of session with minimal vocalization.   General Comments                  Home Living Family/patient expects to be discharged to:: Private residence Living Arrangements: Spouse/significant other;Children Available Help at Discharge: Family;Available 24 hours/day Type of Home: House Home Access: Stairs to enter [employer] of Steps: 5 Entrance Stairs-Rails: Right;Left;Can reach both Home Layout: One level;Two level;Able to live on main level with bedroom/bathroom;Full bath on main level Alternate Level Stairs-Number of Steps: 8 Alternate Level Stairs-Rails: Right;Left;Can reach both Bathroom Shower/Tub: Chief Strategy Officer: Handicapped height Bathroom Accessibility: Yes   Home Equipment: Cane - single point;Walker - 2 wheels;Shower seat;Bedside commode;Grab bars - tub/shower   Additional Comments: Information taken from previous admission due to pt's lack of communication and response.      Prior Functioning/Environment Level of Independence: Needs assistance  Gait / Transfers Assistance Needed:  household ambulator using [facility] ADL's / Homemaking Assistance Needed: assisted by family   Comments: Prior function taken from previous admission.        OT Problem List: Decreased range of motion;Decreased strength;Decreased activity tolerance;Impaired balance (sitting and/or standing);Decreased coordination;Decreased safety awareness      OT Treatment/Interventions: Self-care/ADL training;Therapeutic exercise;Patient/family education;Balance training;Therapeutic activities;Cognitive remediation/compensation;DME and/or AE instruction;Neuromuscular education    OT Goals(Current goals can be found in the care plan section) Acute Rehab OT Goals Patient Stated Goal: Pt did not participate in goal setting. OT Goal Formulation: Patient unable to participate in goal setting Time For Goal Achievement: 06/17/21 Potential to Achieve Goals: Fair  OT Frequency: Min 2X/week               Co-evaluation PT/OT/SLP Co-Evaluation/Treatment: Yes Reason for Co-Treatment: Complexity of the patient's impairments (multi-system involvement) PT goals addressed during session: Mobility/safety with mobility;Balance;Proper use of DME OT goals addressed during session: ADL's and self-care      AM-PAC OT "6 Clicks" Daily Activity     Outcome Measure Help from another person eating meals?: A Lot Help from another person taking care of personal grooming?: A Lot Help from another person toileting, which includes using toliet, bedpan, or urinal?: A Lot Help from another person bathing (including washing, rinsing, drying)?: A Lot Help from another person to put on and taking off regular upper body clothing?: A Lot Help from another person to put on and taking off regular lower body clothing?: A  Lot 6 Click Score: 12   End of Session Equipment Utilized During Treatment: Rolling walker  Activity Tolerance: Patient tolerated treatment well Patient left: in chair;with chair alarm set;with call bell/phone within  reach  OT Visit Diagnosis: Unsteadiness on feet (R26.81);Muscle weakness (generalized) (M62.81)                Time: 7564-3329 OT Time Calculation (min): 18 min Charges:  OT General Charges $OT Visit: 1 Visit OT Evaluation $OT Eval Moderate Complexity: 1 Mod  Marene Gilliam OT, MOT  [name] 06/03/2021, 12:11 PM

## 2021-06-03 NOTE — Evaluation (Signed)
Physical Therapy Evaluation Patient Details Name: Joshua Morales MRN: [id number] DOB: August 12, 1946 Today's Date: 06/03/2021  History of Present Illness  Joshua Morales is a 75 y.o. male with medical history significant of dementia, htn, CKD3b, DM, atrial thrombus, anticoagulant use, and previous CVA's. Per the patient's daughter he was slow to respond this morning and has bilateral lower extremity edema limited to the ankles and feet. He is hypertensive, 190/73, hyperglycemic 277, slightly bradycardic with an occasional irregularity, ekg consistent with his previous hospitalization, sinus rhythm rate of 55, without st-segment changes. Recently he had a holter monitor after his most recent cva. A brief 4 beat run of vtach is reported, a four beat run of svt.   Clinical Impression  Patient demonstrates slow labored movement for sitting up at bedside requiring repeated verbal/tactile cueing, unable to extend right leg while seated at bedside, very unsteady and limited to a few side steps before having to sit due to weakness and fatigue.  Patient tolerated sitting up in chair after therapy - nursing staff notified.  Patient will benefit from continued physical therapy in hospital and recommended venue below to increase strength, balance, endurance for safe ADLs and gait.          Recommendations for follow up therapy are one component of a multi-disciplinary discharge planning process, led by the attending physician.  Recommendations may be updated based on patient status, additional functional criteria and insurance authorization.  Follow Up Recommendations SNF    Equipment Recommendations  None recommended by PT    Recommendations for Other Services       Precautions / Restrictions Precautions Precautions: Sternal;Fall Restrictions Weight Bearing Restrictions: No      Mobility  Bed Mobility Overal bed mobility: Needs Assistance Bed Mobility: Supine to Sit     Supine to sit: Mod  assist;Max assist     General bed mobility comments: see OT notes    Transfers Overall transfer level: Needs assistance Equipment used: Rolling walker (2 wheeled) Transfers: Sit to/from [employer] Sit to Stand: Mod assist Stand pivot transfers: Mod assist       General transfer comment: slow labored unsteady movement  Ambulation/Gait Ambulation/Gait assistance: Mod assist;Max assist Gait Distance (Feet): 4 Feet Assistive device: Rolling walker (2 wheeled) Gait Pattern/deviations: Decreased step length - right;Decreased step length - left;Decreased stride length Gait velocity: decreased   General Gait Details: limited to a few slow labored side steps before having to sit due to BLE weakness and fatigue  Stairs            Wheelchair Mobility    Modified Rankin (Stroke Patients Only)       Balance Overall balance assessment: Needs assistance Sitting-balance support: Feet supported;No upper extremity supported Sitting balance-Leahy Scale: Fair Sitting balance - Comments: seated at EOB   Standing balance support: During functional activity;Bilateral upper extremity supported Standing balance-Leahy Scale: Poor Standing balance comment: using RW                             Pertinent Vitals/Pain Pain Assessment: No/denies pain Pain Score: 0-No pain    Home Living Family/patient expects to be discharged to:: Private residence Living Arrangements: Spouse/significant other;Children Available Help at Discharge: Family;Available 24 hours/day Type of Home: House Home Access: Stairs to enter Entrance Stairs-Rails: Right;Left;Can reach both Entrance Stairs-Number of Steps: 5 Home Layout: One level;Two level;Able to live on main level with bedroom/bathroom;Full bath on main level Home Equipment:  Cane - single point;Walker - 2 wheels;Shower seat;Bedside commode;Grab bars - tub/shower Additional Comments: Information taken from previous  admission due to pt's lack of communication and response.    Prior Function Level of Independence: Needs assistance   Gait / Transfers Assistance Needed: household ambulator using [facility]  ADL's / Homemaking Assistance Needed: assisted by family  Comments: Prior function taken from previous admission.     Hand Dominance   Dominant Hand: Right    Extremity/Trunk Assessment   Upper Extremity Assessment Upper Extremity Assessment: Defer to OT evaluation RUE Deficits / Details: Limited to ~90* A/ROM and P/ROm for shoulder flexion. Pt observed to bear weight on R UE during transfer. LUE Deficits / Details: Limited to ~90* A/ROM for shoulder flexion. ~75% available P/ROM for shoulder flexion. Pt able to bear weight through L UE during trasnfers.    Lower Extremity Assessment Lower Extremity Assessment: Generalized weakness;RLE deficits/detail RLE Deficits / Details: grossly 3+/5 RLE Sensation: decreased light touch RLE Coordination: decreased gross motor    Cervical / Trunk Assessment Cervical / Trunk Assessment: Kyphotic  Communication   Communication: Expressive difficulties  Cognition Arousal/Alertness: Awake/alert Behavior During Therapy: Flat affect Overall Cognitive Status: History of cognitive impairments - at baseline                                 General Comments: requires repeated verbal/tactile cueing to complete tasks      General Comments      Exercises     Assessment/Plan    PT Assessment Patient needs continued PT services  PT Problem List Decreased strength;Decreased balance;Decreased activity tolerance;Decreased mobility       PT Treatment Interventions DME instruction;Gait training;Stair training;Functional mobility training;Therapeutic activities;Therapeutic exercise;Patient/family education;Balance training;Neuromuscular re-education    PT Goals (Current goals can be found in the Care Plan section)  Acute Rehab PT Goals Patient  Stated Goal: return home with family to assist PT Goal Formulation: With patient Time For Goal Achievement: 06/17/21 Potential to Achieve Goals: Good    Frequency Min 3X/week   Barriers to discharge        Co-evaluation PT/OT/SLP Co-Evaluation/Treatment: Yes Reason for Co-Treatment: Complexity of the patient's impairments (multi-system involvement);To address functional/ADL transfers PT goals addressed during session: Mobility/safety with mobility;Balance;Proper use of DME OT goals addressed during session: ADL's and self-care       AM-PAC PT "6 Clicks" Mobility  Outcome Measure Help needed turning from your back to your side while in a flat bed without using bedrails?: A Lot Help needed moving from lying on your back to sitting on the side of a flat bed without using bedrails?: A Lot Help needed moving to and from a bed to a chair (including a wheelchair)?: A Lot Help needed standing up from a chair using your arms (e.g., wheelchair or bedside chair)?: A Lot Help needed to walk in hospital room?: A Lot Help needed climbing 3-5 steps with a railing? : Total 6 Click Score: 11    End of Session   Activity Tolerance: Patient tolerated treatment well;Patient limited by fatigue Patient left: in chair;with call bell/phone within reach;with chair alarm set Nurse Communication: Mobility status PT Visit Diagnosis: Unsteadiness on feet (R26.81);Muscle weakness (generalized) (M62.81)    Time: 4709-6283 PT Time Calculation (min) (ACUTE ONLY): 24 min   Charges:   PT Evaluation $PT Eval Moderate Complexity: 1 Mod PT Treatments $Therapeutic Activity: 23-37 mins  11:55 AM, 06/03/21 [name], MPT Physical Therapist with [facility] [phone] office [phone] mobile phone

## 2021-06-03 NOTE — Evaluation (Signed)
Speech Language Pathology Evaluation Patient Details Name: Joshua Morales MRN: [id number] DOB: 1946/07/23 Today's Date: 06/03/2021 Time: 1500-1520 SLP Time Calculation (min) (ACUTE ONLY): 20 min  Problem List:  Patient Active Problem List   Diagnosis Date Noted   Elevated troponin    Acute ischemic stroke (HCC) 05/25/2021   Acute renal failure superimposed on stage 3b chronic kidney disease (HCC) 05/25/2021   Right sided weakness 05/24/2021   Enteritis, enterotoxigenic E. 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 04/05/2021   Dementia associated with alcoholism (HCC) 04/05/2021   Acute metabolic encephalopathy 04/05/2021   Past Medical History:  Past Medical History:  Diagnosis Date   Agent orange exposure    Atrial thrombus 05/05/2021   CRD (chronic renal disease), stage 3b (HCC) 05/05/2021   Dementia (HCC)    Diabetes mellitus without complication (HCC)    Hypertension    Macrocytic anemia 05/05/2021   Past Surgical History:  Past Surgical History:  Procedure Laterality Date   BUBBLE STUDY  04/07/2021   Procedure: BUBBLE STUDY;  Surgeon: Jonelle Sidle, MD;  Location: [facility];  Service: Cardiovascular;;  with TEE   LUNG REMOVAL, PARTIAL     TEE WITHOUT CARDIOVERSION N/A 04/07/2021   Procedure: TRANSESOPHAGEAL ECHOCARDIOGRAM (TEE);  Surgeon: Jonelle Sidle, MD;  Location: [facility];  Service: Cardiovascular;  Laterality: N/A;   TOE AMPUTATION     HPI:  ARLIND KLINGERMAN is a  75 y.o. male with medical history significant of dementia, htn, CKD3b, DM, atrial thrombus, anticoagulant use, and previous CVA's. Per the patient's daughter he was slow to respond the morning of 9/28 and was brought to the ED. Head ct and MRI showed acute multifocal punctate infarcts in bilateral cerebral hemispheres and cerebellum, indicating a cardioembolic source. SLE requested.   Assessment / Plan / Recommendation Clinical Impression  Pt presents with moderate cognitive linguistic deficits in setting of possible baseline dementia, no family present to confirm baseline at this time. Pt with deficits in working memory, attention, orientation, and following moderately complex directions. He required extra time to process and to respond. Pt may benefit from ongoing SLP intervention at next venue of care depending on his baseline cognitive status.    SLP Assessment  SLP Recommendation/Assessment: All further Speech Lanaguage Pathology  needs can be addressed in the next venue of care SLP Visit Diagnosis: Cognitive communication deficit (R41.841)    Recommendations for follow up therapy are one component of a multi-disciplinary discharge planning process, led by the attending physician.  Recommendations may be updated based on patient status, additional functional criteria and insurance authorization.    Follow Up Recommendations  24 hour supervision/assistance    Frequency and Duration           SLP Evaluation Cognition  Overall Cognitive Status: History of cognitive impairments - at baseline Arousal/Alertness: Awake/alert Orientation Level: Disoriented to time;Disoriented to place;Oriented to person Year: 2022 Month: August Day of Week: Incorrect Attention: Sustained Sustained Attention: Impaired Sustained Attention Impairment: Verbal complex Memory: Impaired Memory Impairment: Decreased short term memory  Decreased Short Term Memory: Verbal complex Awareness: Impaired Awareness  Impairment: Emergent impairment Problem Solving: Impaired Problem Solving Impairment: Verbal complex Executive Function: Decision Making;Organizing Behaviors: Perseveration       Comprehension  Auditory Comprehension Overall Auditory Comprehension: Impaired Yes/No Questions: Within Functional Limits Commands: Impaired One Step Basic Commands: 75-100% accurate Two Step Basic Commands: 50-74% accurate Conversation: Simple Interfering Components: Attention;Working memory;Processing speed EffectiveTechniques: Extra processing time;Increased volume;Repetition Visual Recognition/Discrimination Discrimination: Not tested Reading Comprehension Reading Status: Not tested    Expression Expression Primary Mode of Expression: Verbal Verbal Expression Overall Verbal Expression: Impaired Initiation: No impairment Automatic Speech: Name;Social Response Level of Generative/Spontaneous Verbalization: Sentence Repetition: Impaired Level of Impairment: Sentence level Naming: Impairment Responsive: 76-100% accurate Confrontation: Impaired Convergent: 75-100% accurate Divergent: 50-74% accurate Pragmatics: No impairment Interfering Components: Speech intelligibility Effective Techniques: Semantic cues;Sentence completion Non-Verbal Means of Communication: Not applicable Written Expression Dominant Hand: Right Written Expression: Not tested   Oral / Motor  Oral Motor/Sensory Function Overall Oral Motor/Sensory Function: Within functional limits Motor Speech Overall Motor Speech: Appears within functional limits for tasks assessed   Thank you,  Havery Moros, CCC-SLP [phone]                     Fraya Ueda 06/03/2021, 3:33 PM

## 2021-06-03 NOTE — Progress Notes (Signed)
ANTICOAGULATION CONSULT NOTE - Initial Consult  Pharmacy Consult for Xarelto=> Lovenox and Coumadin Indication:  CVA  No Known Allergies  Patient Measurements: Height: 6\' 1"  (185.4 cm) Weight: 62.7 kg (138 lb 3.7 oz) IBW/kg (Calculated) : 79.9  Vital Signs: Temp: 98.4 F (36.9 C) (09/29 0654) BP: 178/82 (09/29 0654) Pulse Rate: 65 (09/29 0654)  Labs: Recent Labs    06/02/21 0907 06/02/21 1041 06/03/21 0526  HGB 8.8*  --   --   HCT 28.2*  --   --   PLT 164  --   --   CREATININE 1.92*  --  2.01*  TROPONINIHS 238* 236*  --     Estimated Creatinine Clearance: 29 mL/min (A) (by C-G formula based on SCr of 2.01 mg/dL (H)).   Medical History: Past Medical History:  Diagnosis Date   Agent orange exposure    Atrial thrombus 05/05/2021   CRD (chronic renal disease), stage 3b (HCC) 05/05/2021   Dementia (HCC)    Diabetes mellitus without complication (HCC)    Hypertension    Macrocytic anemia 05/05/2021    Medications:  Medications Prior to Admission  Medication Sig Dispense Refill Last Dose   ACIDOPHILUS LACTOBACILLUS PO Take 2 capsules by mouth in the morning and at bedtime.   06/01/2021   atenolol (TENORMIN) 25 MG tablet Take 1 tablet (25 mg total) by mouth daily. 30 tablet 1 06/01/2021 at 0800   atorvastatin (LIPITOR) 40 MG tablet Take 40 mg by mouth daily.   06/01/2021   Cholecalciferol (D3-1000) 25 MCG (1000 UT) tablet Take 2,000 Units by mouth daily.   06/01/2021   donepezil (ARICEPT) 5 MG tablet Take 1 tablet (5 mg total) by mouth at bedtime. 30 tablet 1 06/01/2021   folic acid (FOLVITE) 1 MG tablet Take 1 tablet (1 mg total) by mouth daily. 30 tablet 1 06/01/2021   hydrOXYzine (ATARAX/VISTARIL) 10 MG tablet Take 10 mg by mouth 2 (two) times daily as needed for itching.   06/01/2021   insulin aspart (NOVOLOG) 100 UNIT/ML FlexPen Inject 2 Units into the skin 3 (three) times daily with meals. IF EATS 50% OR MORE OF MEAL. (Patient taking differently: Inject 4 Units into the  skin 3 (three) times daily with meals. IF EATS 50% OR MORE OF MEAL.) 15 mL 0 06/01/2021   insulin detemir (LEVEMIR) 100 UNIT/ML FlexPen Inject 8 Units into the skin daily.   06/01/2021   Insulin Pen Needle 31G X 5 MM MISC 1 Device by Does not apply route as directed. 90 each 2    memantine (NAMENDA) 5 MG tablet Take 1 tablet (5 mg total) by mouth 2 (two) times daily. 60 tablet 1 06/01/2021   mirtazapine (REMERON) 15 MG tablet Take 15 mg by mouth at bedtime.   06/01/2021   Multiple Vitamin (MULTIVITAMIN WITH MINERALS) TABS tablet Take 1 tablet by mouth daily.   06/01/2021   QUEtiapine (SEROQUEL) 25 MG tablet Take 1 tablet (25 mg total) by mouth at bedtime. 30 tablet 0 06/01/2021   Rivaroxaban (XARELTO) 15 MG TABS tablet Take 1 tablet (15 mg total) by mouth daily with supper. 30 tablet 5 06/01/2021 at 1800   thiamine 100 MG tablet Take 1 tablet (100 mg total) by mouth daily. 30 tablet 1 06/01/2021   hydrALAZINE (APRESOLINE) 50 MG tablet Take 1 tablet (50 mg total) by mouth 3 (three) times daily. (Patient not taking: Reported on 06/02/2021) 90 tablet 5 Not Taking   polyethylene glycol (MIRALAX / GLYCOLAX) 17 g packet Take  17 g by mouth daily as needed for mild constipation. (Patient not taking: Reported on 06/02/2021) 14 each 0 Not Taking    Assessment:  Patient was admitted for altered mental status and found to have acute multifocal punctate infarcts in bilateral cerebral hemispheres and cerebellum, indicating a cardioembolic source. He is currently on xarelto for atrial thrombus discovered in August.  Repeat TTE.With failure of treatment on xarelto and previous failure on apixaban, MD's and family have decided to anticoagulate with Coumadin. Will bridge coumadin with lovenox, starting 24 hours after last xarelto dose on 9/28 at 1730. Continue coumadin and lovenox until INR> 2  Goal of Therapy:  INR 2-3 Monitor platelets by anticoagulation protocol: Yes   Plan:  Enoxaparin 1mg /kg sq q12 h (until INR>  2) Coumadin 5mg  po x 1 today Daily PT- INR Monitor CBC daily, and s/s of bleeding  , BS , BCPS Clinical Pharmacist Pager [phone] 06/03/2021,1:34 PM

## 2021-06-03 NOTE — Progress Notes (Signed)
Neurology Telephone Note  Contacted by Dr. Standley Dakins and [facility] about this patient at [facility]. He was admitted for altered mental status and found to have acute multifocal punctate infarcts in bilateral cerebral hemispheres and cerebellum, indicating a cardioembolic source. He is currently on xarelto for atrial thrombus discovered in August. I made the following telephone recommendations regarding this patient:  - Permissive HTN x48 hrs from sx onset goal BP <220/110. PRN labetalol or hydralazine if BP above these parameters. Avoid oral antihypertensives. After 48 hours goal is normotension, with strict avoidance of hypotension - MRA neck (MRA head already performed) - Repeat TTE. Touch base with cardiology after TTE results and let them know patient has had a cardioembolic stroke while on xarelto. They may wish to consider alternative agent for anticoagulation (per chart review he has already failed apixaban) - Continue atorvastatin 40mg  daily for LDL 52 - No neuro indication for antiplatelets in the setting of therapeutic anticoagulation - q4 hr neuro checks - STAT head CT for any change in neuro exam - Tele - PT/OT/SLP - Stroke education - Amb referral to neurology upon discharge  , MD [facility] [phone]  If 7pm- 7am, please page neurology on call as listed in AMION.

## 2021-06-03 NOTE — Progress Notes (Signed)
Recurrent strokes on anticoagulation. Started of warfarin which is reasonable. Another option is to give the full 20 mg of xarelto regardless of Cr. No indication of bacteria but blood cultures done. Additional labs done.

## 2021-06-03 NOTE — TOC Initial Note (Signed)
Transition of Care [facility]) - Initial/Assessment Note    Patient Details  Name: Joshua Morales MRN: [id number] Date of Birth: February 21, 1946  Transition of Care [facility]) CM/SW Contact:    Barry Brunner, LCSW Phone Number: 06/03/2021, 1:56 PM  Clinical Narrative:                 Patient is a 75 year old male admitted for Acute CVA (cerebrovascular accident)/Multiple punctate acute/sub infarcts in the bilateral anterior. CSW conducted initial assessment. Patient's daughter reported that patient isn't agreeable to SNF and would like to return home with [facility]. Patient's daughter inquired if [facility] visits could be increased. CSW notified patient's daughter that the home health company would have to reaccess patient for the number of visits a week that would be able to be provided. Patient is active with [facility]. TOC to follow.   Expected Discharge Plan: Home w Home Health Services Barriers to Discharge: Barriers Resolved   Patient Goals and CMS Choice Patient states their goals for this hospitalization and ongoing recovery are:: Return home with [facility] CMS Medicare.gov Compare Post Acute Care list provided to:: Patient Choice offered to / list presented to : Patient  Expected Discharge Plan and Services Expected Discharge Plan: Home w Home Health Services       Living arrangements for the past 2 months: Single Family Home                           HH Arranged: RN, PT   Date [facility] Agency Contacted: 06/03/21 Time HH Agency Contacted: [phone] Representative spoke with at [facility] Agency: Bonita Quin  Prior Living Arrangements/Services Living arrangements for the past 2 months: Single Family Home Lives with:: Self, Adult Children Patient language and need for interpreter reviewed:: Yes Do you feel safe going back to the place where you live?: Yes      Need for Family Participation in Patient Care: Yes (Comment) Care giver support system in place?: Yes (comment)   Criminal Activity/Legal Involvement Pertinent to  Current Situation/Hospitalization: No - Comment as needed  Activities of Daily Living Home Assistive Devices/Equipment: Cane (specify quad or straight) (walker) ADL Screening (condition at time of admission) Patient's cognitive ability adequate to safely complete daily activities?: Yes Is the patient deaf or have difficulty hearing?: No Does the patient have difficulty seeing, even when wearing glasses/contacts?: Yes Does the patient have difficulty concentrating, remembering, or making decisions?: Yes Patient able to express need for assistance with ADLs?: Yes Does the patient have difficulty dressing or bathing?: Yes Independently performs ADLs?: No Communication: Independent Dressing (OT): Needs assistance Is this a change from baseline?: Pre-admission baseline Grooming: Needs assistance Is this a change from baseline?: Pre-admission baseline Feeding: Independent Bathing: Needs assistance Is this a change from baseline?: Pre-admission baseline Toileting: Independent with device (comment) In/Out Bed: Independent Walks in Home: Needs assistance Is this a change from baseline?: Pre-admission baseline Does the patient have difficulty walking or climbing stairs?: Yes Weakness of Legs: None Weakness of Arms/Hands: None  Permission Sought/Granted Permission sought to share information with : Family Supports Permission granted to share information with : Yes, Verbal Permission Granted  Share Information with NAME: Atchley,vanezzia  Permission granted to share info w AGENCY: Home health  Permission granted to share info w Relationship: (Daughter)  Permission granted to share info w Contact Information: [phone]  Emotional Assessment       Orientation: : Oriented to Self, Oriented to Situation, Oriented to Place, Oriented  to  Time Alcohol / Substance Use: Not Applicable Psych Involvement: No (comment)  Admission diagnosis:  Elevated troponin [R77.8] Acute CVA  (cerebrovascular accident) [facility]) [I63.9] Patient Active Problem List   Diagnosis Date Noted   Elevated troponin    Acute ischemic stroke (HCC) 05/25/2021   Acute renal failure superimposed on stage 3b chronic kidney disease (HCC) 05/25/2021   Right sided weakness 05/24/2021   Enteritis, enterotoxigenic E. 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 [place], [place] - [address] [place] [address] Phone: [phone] Fax: [phone]     Social Determinants of Health (SDOH) Interventions    Readmission Risk Interventions Readmission Risk Prevention Plan 05/27/2021  Transportation Screening Complete  HRI or Home Care Consult Complete  Palliative Care Screening Not Applicable  Medication Review (RN Care Manager) Complete  Some recent data might be hidden

## 2021-06-04 ENCOUNTER — Inpatient Hospital Stay (HOSPITAL_COMMUNITY)
Admit: 2021-06-04 | Discharge: 2021-06-04 | Disposition: A | Discharge status: Home / Self Care | Payer: No Typology Code available for payment source | Attending: Neurology | Admitting: Neurology

## 2021-06-04 DIAGNOSIS — N1832 Chronic kidney disease, stage 3b: Secondary | ICD-10-CM

## 2021-06-04 LAB — MAGNESIUM: Magnesium: 1.7 mg/dL (ref 1.7–2.4)

## 2021-06-04 LAB — GLUCOSE, CAPILLARY
Glucose-Capillary: 194 mg/dL — ABNORMAL HIGH (ref 70–99)
Glucose-Capillary: 226 mg/dL — ABNORMAL HIGH (ref 70–99)
Glucose-Capillary: 115 mg/dL — ABNORMAL HIGH (ref 70–99)
Glucose-Capillary: 116 mg/dL — ABNORMAL HIGH (ref 70–99)
Glucose-Capillary: 100 mg/dL — ABNORMAL HIGH (ref 70–99)

## 2021-06-04 LAB — CBC
HCT: 29.3 % — ABNORMAL LOW (ref 39.0–52.0)
Hemoglobin: 9.6 g/dL — ABNORMAL LOW (ref 13.0–17.0)
MCH: 33.8 pg (ref 26.0–34.0)
MCHC: 32.8 g/dL (ref 30.0–36.0)
MCV: 103.2 fL — ABNORMAL HIGH (ref 80.0–100.0)
Platelets: 169 10*3/uL (ref 150–400)
RBC: 2.84 MIL/uL — ABNORMAL LOW (ref 4.22–5.81)
RDW: 13.8 % (ref 11.5–15.5)
WBC: 15.5 10*3/uL — ABNORMAL HIGH (ref 4.0–10.5)
nRBC: 0 % (ref 0.0–0.2)

## 2021-06-04 LAB — PROTIME-INR
INR: 1.2 (ref 0.8–1.2)
Prothrombin Time: 14.7 seconds (ref 11.4–15.2)

## 2021-06-04 LAB — BASIC METABOLIC PANEL
Anion gap: 10 (ref 5–15)
BUN: 32 mg/dL — ABNORMAL HIGH (ref 8–23)
CO2: 26 mmol/L (ref 22–32)
Calcium: 9.3 mg/dL (ref 8.9–10.3)
Chloride: 101 mmol/L (ref 98–111)
Creatinine, Ser: 2.01 mg/dL — ABNORMAL HIGH (ref 0.61–1.24)
GFR, Estimated: 34 mL/min — ABNORMAL LOW (ref >60)
Glucose, Bld: 232 mg/dL — ABNORMAL HIGH (ref 70–99)
Potassium: 5 mmol/L (ref 3.5–5.1)
Sodium: 137 mmol/L (ref 135–145)

## 2021-06-04 LAB — RPR: RPR Ser Ql: NONREACTIVE

## 2021-06-04 LAB — SEDIMENTATION RATE: Sed Rate: 75 mm/hr — ABNORMAL HIGH (ref 0–16)

## 2021-06-04 LAB — C-REACTIVE PROTEIN: CRP: 1.3 mg/dL — ABNORMAL HIGH (ref <1.0)

## 2021-06-04 MED ORDER — WARFARIN SODIUM 5 MG PO TABS
5.0000 mg | ORAL_TABLET | Freq: Once | ORAL | Status: AC
  Administered 2021-06-04: 5 mg via ORAL
  Filled 2021-06-04: qty 1

## 2021-06-04 MED ORDER — ATENOLOL 25 MG PO TABS
25.0000 mg | ORAL_TABLET | Freq: Every day | ORAL | Status: DC
  Administered 2021-06-04 – 2021-06-05 (×2): 25 mg via ORAL
  Filled 2021-06-04 (×2): qty 1

## 2021-06-04 MED ORDER — AMLODIPINE BESYLATE 5 MG PO TABS
5.0000 mg | ORAL_TABLET | Freq: Every day | ORAL | Status: DC
  Administered 2021-06-04: 5 mg via ORAL
  Filled 2021-06-04 (×2): qty 1

## 2021-06-04 MED ORDER — ENOXAPARIN SODIUM 60 MG/0.6ML IJ SOSY
60.0000 mg | PREFILLED_SYRINGE | INTRAMUSCULAR | Status: DC
  Administered 2021-06-05: 60 mg via SUBCUTANEOUS
  Filled 2021-06-04: qty 0.6

## 2021-06-04 NOTE — Plan of Care (Signed)
  Problem: Education: Goal: Knowledge of General Education information will improve Description: Including pain rating scale, medication(s)/side effects and non-pharmacologic comfort measures Outcome: Not Progressing   Problem: Health Behavior/Discharge Planning: Goal: Ability to manage health-related needs will improve Outcome: Not Progressing   Problem: Clinical Measurements: Goal: Ability to maintain clinical measurements within normal limits will improve Outcome: Not Progressing Goal: Will remain free from infection Outcome: Progressing Goal: Diagnostic test results will improve Outcome: Progressing Goal: Respiratory complications will improve Outcome: Progressing Goal: Cardiovascular complication will be avoided Outcome: Progressing   Problem: Activity: Goal: Risk for activity intolerance will decrease Outcome: Not Progressing   Problem: Nutrition: Goal: Adequate nutrition will be maintained Outcome: Not Progressing   Problem: Coping: Goal: Level of anxiety will decrease Outcome: Progressing   Problem: Elimination: Goal: Will not experience complications related to bowel motility Outcome: Progressing Goal: Will not experience complications related to urinary retention Outcome: Progressing   Problem: Pain Managment: Goal: General experience of comfort will improve Outcome: Not Progressing   Problem: Safety: Goal: Ability to remain free from injury will improve Outcome: Not Progressing   Problem: Skin Integrity: Goal: Risk for impaired skin integrity will decrease Outcome: Not Progressing   Problem: Education: Goal: Knowledge of secondary prevention will improve Outcome: Not Progressing Goal: Knowledge of patient specific risk factors addressed and post discharge goals established will improve Outcome: Not Progressing Goal: Individualized Educational Video(s) Outcome: Not Progressing

## 2021-06-04 NOTE — Progress Notes (Signed)
Physical Therapy Treatment Patient Details Name: Joshua Morales MRN: [id number] DOB: Dec 27, 1945 Today's Date: 06/04/2021   History of Present Illness Joshua Morales is a 75 y.o. male with medical history significant of dementia, htn, CKD3b, DM, atrial thrombus, anticoagulant use, and previous CVA's. Per the patient's daughter he was slow to respond this morning and has bilateral lower extremity edema limited to the ankles and feet. He is hypertensive, 190/73, hyperglycemic 277, slightly bradycardic with an occasional irregularity, ekg consistent with his previous hospitalization, sinus rhythm rate of 55, without st-segment changes. Recently he had a holter monitor after his most recent cva. A brief 4 beat run of vtach is reported, a four beat run of svt.    PT Comments    Pt engaged in therapy services this morning and demo alert/awake disposition with difficulty in following one-step direction requiring max verbal/tactile/visual cues for initiation and sequence of mobility. Bed mobility with mod-max A for supine to sit EOB and able to maintain static sitting EOB unsupported with single UE support x 3-5 min.  Sit to stand from EOB completed with mod A and tactile cues to bilateral knees for extension and RLE when weight shifting to prevent buckling. Tapping to right quad during gait training with improved stance stability noted with use of RW and CGA-min A x 10 ft. Continued sessions indicated to progress motor control and LE strength to decrease risk for falls with transfers/gait.     Recommendations for follow up therapy are one component of a multi-disciplinary discharge planning process, led by the attending physician.  Recommendations may be updated based on patient status, additional functional criteria and insurance authorization.  Follow Up Recommendations  SNF     Equipment Recommendations  None recommended by PT    Recommendations for Other Services       Precautions /  Restrictions Precautions Precautions: Fall     Mobility  Bed Mobility Overal bed mobility: Needs Assistance Bed Mobility: Supine to Sit     Supine to sit: Mod assist;Max assist     General bed mobility comments: increased time, labored movement. Cues for task initiation/maintenance    Transfers Overall transfer level: Needs assistance Equipment used: Rolling walker (2 wheeled) Transfers: Sit to/from [employer] Sit to Stand: Mod assist Stand pivot transfers: Mod assist       General transfer comment: slow labored unsteady movement. Tactile cues for weight shifting/acceptance to prevent knee buckling  Ambulation/Gait Ambulation/Gait assistance: Mod assist;Max assist Gait Distance (Feet): 10 Feet Assistive device: Rolling walker (2 wheeled) Gait Pattern/deviations: Decreased step length - right;Decreased step length - left;Decreased stride length Gait velocity: decreased   General Gait Details: limited to a few slow labored side steps before having to sit due to BLE weakness and fatigue   Stairs             Wheelchair Mobility    Modified Rankin (Stroke Patients Only)       Balance                                            Cognition Arousal/Alertness: Awake/alert Behavior During Therapy: Flat affect Overall Cognitive Status: History of cognitive impairments - at baseline  General Comments: able to respond to yes/no with 75% accuracy/efficiency      Exercises      General Comments        Pertinent Vitals/Pain      Home Living     Available Help at Discharge: Family;Available 24 hours/day Type of Home: House Home Access: Stairs to enter   Home Layout: One level;Two level;Able to live on main level with bedroom/bathroom;Full bath on main level        Prior Function            PT Goals (current goals can now be found in the care plan section) Acute Rehab  PT Goals Patient Stated Goal: Pt did not participate in goal setting. PT Goal Formulation: With patient Time For Goal Achievement: 06/17/21 Potential to Achieve Goals: Good Progress towards PT goals: Progressing toward goals    Frequency    Min 3X/week      PT Plan Current plan remains appropriate    Co-evaluation              AM-PAC PT "6 Clicks" Mobility   Outcome Measure  Help needed turning from your back to your side while in a flat bed without using bedrails?: A Lot Help needed moving from lying on your back to sitting on the side of a flat bed without using bedrails?: A Lot Help needed moving to and from a bed to a chair (including a wheelchair)?: A Lot Help needed standing up from a chair using your arms (e.g., wheelchair or bedside chair)?: A Lot Help needed to walk in hospital room?: A Lot Help needed climbing 3-5 steps with a railing? : Total 6 Click Score: 11    End of Session Equipment Utilized During Treatment: Gait belt Activity Tolerance: Patient tolerated treatment well;Patient limited by fatigue Patient left: in chair;with call bell/phone within reach;with chair alarm set Nurse Communication: Mobility status PT Visit Diagnosis: Unsteadiness on feet (R26.81);Muscle weakness (generalized) (M62.81)     Time: 9604-5409 PT Time Calculation (min) (ACUTE ONLY): 25 min  Charges:  $Gait Training: 8-22 mins $Therapeutic Activity: 8-22 mins                    9:08 AM, 06/04/21 M. Shary Decamp, PT, DPT Physical Therapist- [place] Office Number: [phone]

## 2021-06-04 NOTE — Progress Notes (Signed)
EEG completed, results pending. 

## 2021-06-04 NOTE — Progress Notes (Addendum)
PROGRESS NOTE    Joshua Morales  XBW:[id number] DOB: [birth year] DOA: 06/02/2021 PCP: [facility]     Brief Narrative:  Joshua Morales is a 75 y.o. male with medical history significant of dementia, htn, CKD3b, DM, atrial thrombus, anticoagulant use, and previous CVA's. Per the patient's daughter he was slow to respond the morning of 9/28 and was brought to the ED. Head ct and MRI showed acute multifocal punctate infarcts in bilateral cerebral hemispheres and cerebellum, indicating a cardioembolic source. Due to his atrial thrombus and further cva's on xarelto, coumadin was chosen as the next option, currently on lovenox while coumadin is achieving therapeutic dose.   Assessment & Plan:   Principal Problem:    Acute CVA (cerebrovascular accident)/Multiple punctate acute/sub infarcts in the bilateral cerebral hemispheres and cerebellum, indicating a cardioembolic source  -ceased xarelto, last dose 06/02/21 -coumadin started -lovenox until coumadin is in therapeutic range -on home atorvastatin    Atrial thrombus -chronic anticoagulation -telemetry currently     Diabetes mellitus type 2, uncontrolled ([place]) -ssi -heart health carb modified diet CBG (last 3)  Recent Labs    06/04/21 0324 06/04/21 0820 06/04/21 1209  GLUCAP 194* 226* 115*    Right foot wound / blister -right foot lateral calcaneous blister, loose skin overlying a pocket of fluid -heel protection ordered     HTN (hypertension) -Permissive HTN x48 hrs from sx onset goal BP <220/110. PRN labetalol or hydralazine if BP above these parameters. Avoid oral antihypertensives. After 48 hours goal is normotension, with strict avoidance of hypotension     Dementia associated with alcoholism (HCC) -home aricept -home mirtazapine -home quetiapine -folic acid supplementation -thiamine supplementation     Previous CVA -anticoagulated with lovenox and starting coumadin     CRD (chronic renal disease),  stage 3b -current creatinine is 2.01, egfr 34,   DVT prophylaxis: lovenox and coumadin Code Status: full Family Communication: daughter Disposition Plan: anticipate discharge to home with home health and physical therapy. Family declines SNF placement   Consultants:  Neurology Cardiology  Procedures:  2D echocardiogram Ct head wo contrast Mr brain wo contrast Mr angio brain wo contrast Mr angio neck w wo contrast TTE  Subjective: Patient is seated in his chair, without complaint  Objective: Vitals:   06/04/21 0000 06/04/21 0204 06/04/21 0402 06/04/21 0827  BP: (!) 174/69 (!) 163/72 (!) 167/77 (!) 159/89  Pulse: 82 81 86 89  Resp: _0 Temp: 99 F (37.2 C) 99.7 F (37.6 C) 99.4 F (37.4 C) 99.1 F (37.3 C)  TempSrc: Oral Oral Oral Oral  SpO2: 96% 99% 100% 95%  Weight:      Height:        Intake/Output Summary (Last 24 hours) at 06/04/2021 1213 Last data filed at 06/04/2021 1041 Gross per 24 hour  Intake 845.4 ml  Output 1100 ml  Net -254.6 ml   Filed Weights   06/02/21 0829 06/02/21 1600  Weight: 55.8 kg 62.7 kg    Examination:  General exam: Appears calm and comfortable   Respiratory system: Clear to auscultation. Respiratory effort normal.  Cardiovascular system: S1 & S2 heard, RRR. No JVD, murmurs, rubs, gallops or clicks. No pedal edema.  Gastrointestinal system: Abdomen is nondistended, soft and nontender. No organomegaly or masses felt. Normal bowel sounds heard.  Central nervous system: Alert and conversational, not oriented to place or time.  Extremities: he moves his extremities appropriately  Skin: No obvious rashes, lesions, heel dressing on  right heel.  Psychiatry: Judgement and insight appear normal. Mood & affect appropriate.    Data Reviewed: I have personally reviewed following labs and imaging studies  CBC: Recent Labs  Lab 06/02/21 0907 06/04/21 0545  WBC 11.5* 15.5*  NEUTROABS 8.5*  --   HGB 8.8* 9.6*  HCT 28.2*  29.3*  MCV 105.2* 103.2*  PLT 164 784   Basic Metabolic Panel: Recent Labs  Lab 06/02/21 0907 06/03/21 0526 06/04/21 0545  NA 136 138 137  K 5.0 4.8 5.0  CL 104 104 101  CO2 _0 GLUCOSE 277* 136* 232*  BUN 31* 31* 32*  CREATININE 1.92* 2.01* 2.01*  CALCIUM 8.9 9.2 9.3  MG  --  1.9 1.7   GFR: Estimated Creatinine Clearance: 29 mL/min (A) (by C-G formula based on SCr of 2.01 mg/dL (H)). Liver Function Tests: Recent Labs  Lab 06/02/21 0907  AST 26  ALT 35  ALKPHOS 50  BILITOT 0.8  PROT 6.8  ALBUMIN 3.7   No results for input(s): LIPASE, AMYLASE in the last 168 hours. No results for input(s): AMMONIA in the last 168 hours. Coagulation Profile: Recent Labs  Lab 06/03/21 1419 06/04/21 0545  INR 1.1 1.2   Cardiac Enzymes: No results for input(s): CKTOTAL, CKMB, CKMBINDEX, TROPONINI in the last 168 hours. BNP (last 3 results) No results for input(s): PROBNP in the last 8760 hours. HbA1C: Recent Labs    06/02/21 0907  HGBA1C 8.4*   CBG: Recent Labs  Lab 06/03/21 1719 06/03/21 2122 06/04/21 0324 06/04/21 0820 06/04/21 1209  GLUCAP 267* 155* 194* 226* 115*   Lipid Profile: Recent Labs    06/03/21 0526  CHOL 148  HDL 55  LDLCALC 81  TRIG 60  CHOLHDL 2.7   Thyroid Function Tests: Recent Labs    06/02/21 0907  TSH 1.323   Anemia Panel: No results for input(s): VITAMINB12, FOLATE, FERRITIN, TIBC, IRON, RETICCTPCT in the last 72 hours. Urine analysis:    Component Value Date/Time   COLORURINE YELLOW 06/02/2021 0839   APPEARANCEUR CLEAR 06/02/2021 0839   LABSPEC 1.014 06/02/2021 0839   PHURINE 5.0 06/02/2021 0839   GLUCOSEU 150 (A) 06/02/2021 0839   HGBUR MODERATE (A) 06/02/2021 0839   BILIRUBINUR NEGATIVE 06/02/2021 0839   KETONESUR NEGATIVE 06/02/2021 0839   PROTEINUR >=300 (A) 06/02/2021 0839   NITRITE NEGATIVE 06/02/2021 0839   LEUKOCYTESUR NEGATIVE 06/02/2021 0839   Sepsis  Labs: _1 (procalcitonin:4,lacticidven:4)  ) Recent Results (from the past 240 hour(s))  Resp Panel by RT-PCR (Flu A&B, Covid) Nasopharyngeal Swab     Status: None   Collection Time: 06/02/21  8:44 AM   Specimen: Nasopharyngeal Swab; Nasopharyngeal(NP) swabs in vial transport medium  Result Value Ref Range Status   SARS Coronavirus 2 by RT PCR NEGATIVE NEGATIVE Final    Comment: (NOTE) SARS-CoV-2 target nucleic acids are NOT DETECTED.  The SARS-CoV-2 RNA is generally detectable in upper respiratory specimens during the acute phase of infection. The lowest concentration of SARS-CoV-2 viral copies this assay can detect is 138 copies/mL. A negative result does not preclude SARS-Cov-2 infection and should not be used as the sole basis for treatment or other patient management decisions. A negative result may occur with  improper specimen collection/handling, submission of specimen other than nasopharyngeal swab, presence of viral mutation(s) within the areas targeted by this assay, and inadequate number of viral copies(<138 copies/mL). A negative result must be combined with clinical observations, patient history, and epidemiological information. The expected result is Negative.  Fact Sheet for Patients:  EntrepreneurPulse.com.au  Fact Sheet for Healthcare Providers:  IncredibleEmployment.be  This test is no t yet approved or cleared by the [place] FDA and  has been authorized for detection and/or diagnosis of SARS-CoV-2 by FDA under an Emergency Use Authorization (EUA). This EUA will remain  in effect (meaning this test can be used) for the duration of the COVID-19 declaration under Section 564(b)(1) of the Act, 21 U.S.C.section 360bbb-3(b)(1), unless the authorization is terminated  or revoked sooner.       Influenza A by PCR NEGATIVE NEGATIVE Final   Influenza B by PCR NEGATIVE NEGATIVE Final    Comment: (NOTE) The Xpert  Xpress SARS-CoV-2/FLU/RSV plus assay is intended as an aid in the diagnosis of influenza from Nasopharyngeal swab specimens and should not be used as a sole basis for treatment. Nasal washings and aspirates are unacceptable for Xpert Xpress SARS-CoV-2/FLU/RSV testing.  Fact Sheet for Patients: EntrepreneurPulse.com.au  Fact Sheet for Healthcare Providers: IncredibleEmployment.be  This test is not yet approved or cleared by the [place] FDA and has been authorized for detection and/or diagnosis of SARS-CoV-2 by FDA under an Emergency Use Authorization (EUA). This EUA will remain in effect (meaning this test can be used) for the duration of the COVID-19 declaration under Section 564(b)(1) of the Act, 21 U.S.C. section 360bbb-3(b)(1), unless the authorization is terminated or revoked.  Performed at [facility], [address] [place], [place] [address]   Culture, blood (routine x 2)     Status: None (Preliminary result)   Collection Time: 06/02/21  9:07 AM   Specimen: Right Antecubital; Blood  Result Value Ref Range Status   Specimen Description RIGHT ANTECUBITAL  Final   Special Requests   Final    BOTTLES DRAWN AEROBIC AND ANAEROBIC Blood Culture adequate volume   Culture   Final    NO GROWTH 2 DAYS Performed at [facility], [address] [place], [place] [address]    Report Status PENDING  Incomplete  Culture, blood (routine x 2)     Status: None (Preliminary result)   Collection Time: 06/02/21  9:26 AM   Specimen: Left Antecubital; Blood  Result Value Ref Range Status   Specimen Description LEFT ANTECUBITAL  Final   Special Requests   Final    BOTTLES DRAWN AEROBIC AND ANAEROBIC Blood Culture adequate volume   Culture   Final    NO GROWTH 2 DAYS Performed at [facility], [address], [place], [place] [address]    Report Status PENDING  Incomplete         Radiology Studies: MR ANGIO HEAD WO CONTRAST  Result Date:  06/02/2021 CLINICAL DATA:  Bilateral leg swelling, slow to respond started this morning EXAM: MRI HEAD WITHOUT CONTRAST MRA HEAD WITHOUT CONTRAST TECHNIQUE: Multiplanar, multi-echo pulse sequences of the brain and surrounding structures were acquired without intravenous contrast. Angiographic images of the Circle of Willis were acquired using MRA technique without intravenous contrast. COMPARISON:  Same-day noncontrast CT head: Brain MRI 05/25/2021 FINDINGS: MRI HEAD FINDINGS Brain: There are small foci of diffusion restriction in the right parietal and left frontal lobes which were not present on the prior MRI of 05/25/2021, consistent with new small acute infarcts. Additional punctate acute infarct is seen in the right cerebellar hemisphere. There is ongoing evolution of the additional small infarcts seen on the study from 05/25/2021, with overall decreased diffusion restriction. There is no evidence of acute intracranial hemorrhage. There are punctate chronic microhemorrhages in the left  occipital lobe and, left lentiform nucleus, and right aspect of the splenium of the corpus callosum, nonspecific but possibly hypertensive in etiology. SW I signal dropout is also seen in the right caudate head more likely related to prior infarct. There is moderate global parenchymal volume loss with commensurate enlargement of the ventricular system. There is confluent FLAIR signal abnormality in the remainder of the subcortical and periventricular white matter likely reflecting sequela of chronic white matter microangiopathy, not significantly changed in the interim. There is no mass lesion.  There is no midline shift. Vascular: See below Skull and upper cervical spine: Normal marrow signal. Sinuses/Orbits: The paranasal sinuses are clear. A left lens implant is noted. The globes and orbits are otherwise unremarkable. Other: None. MRA HEAD FINDINGS Anterior circulation: There is irregularity of the bilateral intracranial ICAs  likely reflecting atherosclerotic disease resulting in mild-to-moderate stenosis bilaterally, worse on the left where there is focal moderate stenosis, similar to the prior study. The bilateral MCAs and ACAs are patent with mild multifocal irregularity. There is no aneurysm. Posterior circulation: The V4 segments of the vertebral arteries are patent. The basilar artery is patent. The bilateral PCAs are patent. Anatomic variants: None. IMPRESSION: 1. Punctate acute infarcts in the right parietal and left frontal lobes and right cerebellar hemisphere are new since 05/25/2021. 2. Ongoing evolution of additional small infarcts seen on the study from 05/25/2021. 3. No evidence of acute intracranial hemorrhage. 4. No new high-grade stenosis or occlusion of the intracranial vasculature. 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 5. Unchanged parenchymal volume loss and chronic white matter microangiopathy. Electronically Signed   By: Valetta Mole M.D.   On: 06/02/2021 13:22   MR ANGIO NECK W WO CONTRAST  Result Date: 06/03/2021 CLINICAL DATA:  Stroke follow-up. EXAM: MRA NECK WITHOUT AND WITH CONTRAST TECHNIQUE: Multiplanar and multiecho pulse sequences of the neck were obtained without and with intravenous contrast. Angiographic images of the neck were obtained using MRA technique without and with intravenous contrast. CONTRAST:  6.55m GADAVIST GADOBUTROL 1 MMOL/ML IV SOLN COMPARISON:  Carotid duplex April 06, 2021. FINDINGS: The study is limited by motion artifact and phase of contrast. Aortic arch: Normal variant aortic arch branching pattern with the brachiocephalic and left common carotid arteries sharing a common origin. The visualized subclavian arteries are grossly unremarkable. Right carotid system: Normal course and caliber without hemodynamically significant stenosis. Left carotid  system: Normal course and caliber without hemodynamically significant stenosis. Vertebral arteries: Left dominant. Vertebral artery origins and V1 segments are poorly delineated due to artifact. Vertebral arteries are normal in course and caliber from the proximal V2 to the vertebrobasilar confluence without stenosis or evidence of dissection. IMPRESSION: 1. Limited study, as described above. 2. No hemodynamically significant stenosis of the major neck arteries. Electronically Signed   By: KPedro EarlsM.D.   On: 06/03/2021 13:32   MR BRAIN WO CONTRAST  Result Date: 06/02/2021 CLINICAL DATA:  Bilateral leg swelling, slow to respond started this morning EXAM: MRI HEAD WITHOUT CONTRAST MRA HEAD WITHOUT CONTRAST TECHNIQUE: Multiplanar, multi-echo pulse sequences of the brain and surrounding structures were acquired without intravenous contrast. Angiographic images of the Circle of Willis were acquired using MRA technique without intravenous contrast. COMPARISON:  Same-day noncontrast CT head: Brain MRI 05/25/2021 FINDINGS: MRI HEAD FINDINGS Brain: There are small foci of diffusion restriction in the right parietal and left frontal  lobes which were not present on the prior MRI of 05/25/2021, consistent with new small acute infarcts. Additional punctate acute infarct is seen in the right cerebellar hemisphere. There is ongoing evolution of the additional small infarcts seen on the study from 05/25/2021, with overall decreased diffusion restriction. There is no evidence of acute intracranial hemorrhage. There are punctate chronic microhemorrhages in the left occipital lobe and, left lentiform nucleus, and right aspect of the splenium of the corpus callosum, nonspecific but possibly hypertensive in etiology. SW I signal dropout is also seen in the right caudate head more likely related to prior infarct. There is moderate global parenchymal volume loss with commensurate enlargement of the ventricular  system. There is confluent FLAIR signal abnormality in the remainder of the subcortical and periventricular white matter likely reflecting sequela of chronic white matter microangiopathy, not significantly changed in the interim. There is no mass lesion.  There is no midline shift. Vascular: See below Skull and upper cervical spine: Normal marrow signal. Sinuses/Orbits: The paranasal sinuses are clear. A left lens implant is noted. The globes and orbits are otherwise unremarkable. Other: None. MRA HEAD FINDINGS Anterior circulation: There is irregularity of the bilateral intracranial ICAs likely reflecting atherosclerotic disease resulting in mild-to-moderate stenosis bilaterally, worse on the left where there is focal moderate stenosis, similar to the prior study. The bilateral MCAs and ACAs are patent with mild multifocal irregularity. There is no aneurysm. Posterior circulation: The V4 segments of the vertebral arteries are patent. The basilar artery is patent. The bilateral PCAs are patent. Anatomic variants: None. IMPRESSION: 1. Punctate acute infarcts in the right parietal and left frontal lobes and right cerebellar hemisphere are new since 05/25/2021. 2. Ongoing evolution of additional small infarcts seen on the study from 05/25/2021. 3. No evidence of acute intracranial hemorrhage. 4. No new high-grade stenosis or occlusion of the intracranial vasculature. 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 5. Unchanged parenchymal volume loss and chronic white matter microangiopathy. Electronically Signed   By: Valetta Mole M.D.   On: 06/02/2021 13:22   ECHOCARDIOGRAM COMPLETE  Result Date: 06/03/2021    ECHOCARDIOGRAM REPORT   Patient Name:   OZIL STETTLER Date of Exam: 06/03/2021 Medical Rec #:  [id number]        Height:       73.0 in Accession #:    [id number]       Weight:        138.2 lb Date of Birth:  04/23/1946         BSA:          1.839 m Patient Age:    35 years         BP:           178/82 mmHg Patient Gender: M                HR:           65 bpm. Exam Location:  [facility] Procedure: 2D Echo, Cardiac Doppler and Color Doppler Indications:    Stroke  History:        Patient has prior history of Echocardiogram examinations, most                 recent 04/06/2021. Stroke; Risk Factors:Hypertension and Diabetes.                 Tobacco abuse, Alcohol  abuse, Left atrial appendage thrombus.  Sonographer:    Wenda Low Referring Phys: [place]  1. Left ventricular ejection fraction, by estimation, is 65 to 70%. The left ventricle has normal function. The left ventricle has no regional wall motion abnormalities. There is severe left ventricular hypertrophy. Left ventricular diastolic parameters  are consistent with Grade I diastolic dysfunction (impaired relaxation).  2. Right ventricular systolic function is normal. The right ventricular size is normal. There is normal pulmonary artery systolic pressure.  3. The pericardial effusion is posterior to the left ventricle and anterior to the right ventricle.  4. The mitral valve is abnormal. Trivial mitral valve regurgitation. No evidence of mitral stenosis. The mean mitral valve gradient is 2.0 mmHg.  5. The aortic valve is tricuspid. There is mild thickening of the aortic valve. Aortic valve regurgitation is not visualized. No aortic stenosis is present. Aortic valve mean gradient measures 2.0 mmHg.  6. The inferior vena cava is normal in size with greater than 50% respiratory variability, suggesting right atrial pressure of 3 mmHg. Comparison(s): Prior images reviewed side by side. LVEF is normal with severe LVH. FINDINGS  Left Ventricle: Left ventricular ejection fraction, by estimation, is 65 to 70%. The left ventricle has normal function. The left ventricle has no regional wall motion abnormalities. The left  ventricular internal cavity size was normal in size. There is  severe left ventricular hypertrophy. Left ventricular diastolic parameters are consistent with Grade I diastolic dysfunction (impaired relaxation). Right Ventricle: The right ventricular size is normal. No increase in right ventricular wall thickness. Right ventricular systolic function is normal. There is normal pulmonary artery systolic pressure. The tricuspid regurgitant velocity is 2.37 m/s, and  with an assumed right atrial pressure of 3 mmHg, the estimated right ventricular systolic pressure is 68.1 mmHg. Left Atrium: Left atrial size was normal in size. Right Atrium: Right atrial size was normal in size. Pericardium: Trivial pericardial effusion is present. The pericardial effusion is posterior to the left ventricle and anterior to the right ventricle. Mitral Valve: The mitral valve is abnormal. There is mild thickening of the mitral valve leaflet(s). Trivial mitral valve regurgitation. No evidence of mitral valve stenosis. MV peak gradient, 5.1 mmHg. The mean mitral valve gradient is 2.0 mmHg. Tricuspid Valve: The tricuspid valve is grossly normal. Tricuspid valve regurgitation is mild. Aortic Valve: The aortic valve is tricuspid. There is mild thickening of the aortic valve. Aortic valve regurgitation is not visualized. No aortic stenosis is present. Aortic valve mean gradient measures 2.0 mmHg. Aortic valve peak gradient measures 4.3 mmHg. Aortic valve area, by VTI measures 3.06 cm. Pulmonic Valve: The pulmonic valve was grossly normal. Pulmonic valve regurgitation is trivial. Aorta: The aortic root is normal in size and structure. Venous: The inferior vena cava is normal in size with greater than 50% respiratory variability, suggesting right atrial pressure of 3 mmHg. IAS/Shunts: No atrial level shunt detected by color flow Doppler.  LEFT VENTRICLE PLAX 2D LVIDd:         4.20 cm  Diastology LVIDs:         2.30 cm  LV e' medial:    5.77 cm/s  LV PW:         1.60 cm  LV E/e' medial:  10.6 LV IVS:        1.60 cm  LV e' lateral:   6.64 cm/s LVOT diam:     2.10 cm  LV E/e' lateral: 9.2 LV SV:  63 LV SV Index:   34 LVOT Area:     3.46 cm  RIGHT VENTRICLE RV Basal diam:  3.05 cm RV Mid diam:    2.70 cm LEFT ATRIUM           Index       RIGHT ATRIUM           Index LA diam:      2.70 cm 1.47 cm/m  RA Area:     16.70 cm LA Vol (A4C): 47.4 ml 25.77 ml/m RA Volume:   47.50 ml  25.83 ml/m  AORTIC VALVE                   PULMONIC VALVE AV Area (Vmax):    2.55 cm    PV Vmax:       0.79 m/s AV Area (Vmean):   2.47 cm    PV Peak grad:  2.5 mmHg AV Area (VTI):     3.06 cm AV Vmax:           103.95 cm/s AV Vmean:          67.600 cm/s AV VTI:            0.207 m AV Peak Grad:      4.3 mmHg AV Mean Grad:      2.0 mmHg LVOT Vmax:         76.40 cm/s LVOT Vmean:        48.200 cm/s LVOT VTI:          0.183 m LVOT/AV VTI ratio: 0.88  AORTA Ao Root diam: 3.70 cm MITRAL VALVE               TRICUSPID VALVE MV Area (PHT): 2.93 cm    TR Peak grad:   22.5 mmHg MV Area VTI:   2.12 cm    TR Vmax:        237.00 cm/s MV Peak grad:  5.1 mmHg MV Mean grad:  2.0 mmHg    SHUNTS MV Vmax:       1.13 m/s    Systemic VTI:  0.18 m MV Vmean:      61.4 cm/s   Systemic Diam: 2.10 cm MV Decel Time: 259 msec MV E velocity: 61.30 cm/s MV A velocity: 97.30 cm/s MV E/A ratio:  0.63 Rozann Lesches MD Electronically signed by Rozann Lesches MD Signature Date/Time: 06/03/2021/5:27:12 PM    Final         Scheduled Meds:  atorvastatin  80 mg Oral QPM   cholecalciferol  2,000 Units Oral Daily   donepezil  5 mg Oral QHS   [START ON 06/05/2021] enoxaparin (LOVENOX) injection  60 mg Subcutaneous Q24H   feeding supplement  237 mL Oral BID BM   folic acid  1 mg Oral Daily   insulin aspart  0-5 Units Subcutaneous QHS   insulin aspart  0-9 Units Subcutaneous TID WC   insulin aspart  4 Units Subcutaneous TID WC   insulin detemir  8 Units Subcutaneous Daily   memantine  5 mg Oral BID    mirtazapine  15 mg Oral QHS   multivitamin with minerals  1 tablet Oral Daily   QUEtiapine  25 mg Oral QHS   thiamine  100 mg Oral Daily   warfarin  5 mg Oral ONCE-1600   Warfarin - Pharmacist Dosing Inpatient   Does not apply q1600   Continuous Infusions:  sodium chloride 30 mL/hr at 06/03/21 1826  LOS: 1 day       Joshua Morales, student [facility] Pager [phone]  If 7PM-7AM, please contact night-coverage [email or web address] Password [facility] 06/04/2021, 12:13 PM      Patient seen and examined with Medical student. In addition to supervising the encounter, I played a key role in the decision making process as well as reviewed key findings.  Gerlene Fee, MD How to contact the [facility] Attending or Consulting provider [place] or covering provider during after hours [place], for this patient?  Check the care team in [facility] and look for a) attending/consulting [facility] provider listed and b) the [facility] team listed Log into [email or web address] and use [facility] universal password to access. If you do not have the password, please contact the hospital operator. Locate the [facility] provider you are looking for under [facility] and page to a number that you can be directly reached. If you still have difficulty reaching the provider, please page the [facility] (Director on Call) for the Hospitalists listed on amion for assistance.

## 2021-06-04 NOTE — Progress Notes (Signed)
Occupational Therapy Treatment Patient Details Name: Joshua Morales MRN: [id number] DOB: [birth year] Today's Date: 06/04/2021   History of present illness Joshua Morales is a 75 y.o. male with medical history significant of dementia, htn, CKD3b, DM, atrial thrombus, anticoagulant use, and previous CVA's. Per the patient's daughter he was slow to respond this morning and has bilateral lower extremity edema limited to the ankles and feet. He is hypertensive, 190/73, hyperglycemic 277, slightly bradycardic with an occasional irregularity, ekg consistent with his previous hospitalization, sinus rhythm rate of 55, without st-segment changes. Recently he had a holter monitor after his most recent cva. A brief 4 beat run of vtach is reported, a four beat run of svt.   OT comments  Pt agreeable to OT treatment. Pt responding to yes/no commands with moderate accuracy today. Pt able to engage in grooming tasks of brushing hair and applying deodorant with tactile cuing and Min A for brushing hair and mod to max A for application of deodorant using R and L UE. Pt able to complete stand pivot transfer from chair to bed with mod A needing tactile cuing throughout for RW management and weight shifts. Pt will benefit from continued OT in the hospital and recommended venue below to increase strength, balance, and endurance for safe ADL's.      Recommendations for follow up therapy are one component of a multi-disciplinary discharge planning process, led by the attending physician.  Recommendations may be updated based on patient status, additional functional criteria and insurance authorization.    Follow Up Recommendations  SNF    Equipment Recommendations  None recommended by OT          Precautions / Restrictions Precautions Precautions: Fall       Mobility Bed Mobility Overal bed mobility: Needs Assistance Bed Mobility: Sit to Supine     Supine to sit: Mod assist;Max assist     General bed  mobility comments: increased time, labored movement.    Transfers Overall transfer level: Needs assistance Equipment used: Rolling walker (2 wheeled) Transfers: Sit to/from [employer] Sit to Stand: Mod assist Stand pivot transfers: Mod assist       General transfer comment: slow mabored movement; cuing for weight shift with assist to manage RW    Balance Overall balance assessment: Needs assistance Sitting-balance support: Feet supported;Bilateral upper extremity supported Sitting balance-Leahy Scale: Fair Sitting balance - Comments: poor to fair seated at EOB Postural control: Posterior lean Standing balance support: During functional activity;Bilateral upper extremity supported Standing balance-Leahy Scale: Poor Standing balance comment: using RW                           ADL either performed or assessed with clinical judgement   ADL Overall ADL's : Needs assistance/impaired     Grooming: Applying deodorant;Moderate assistance;Sitting;Maximal assistance;Brushing hair;Minimal assistance Grooming Details (indicate cue type and reason): Pt able to palce deodorant under R arm; assist to place under L arm and top open and close. Pt required min A to brush hair seated in chair using both R and L UE. Pt was not observed to brush posterior portion of hair likely due to shoulder A/ROM limitaions. Tactile cuing needed throughout grooming.                 Toilet Transfer: Moderate assistance;RW;Stand-pivot Toilet Transfer Details (indicate cue type and reason): simulated via chair to EOB transfer  Cognition Arousal/Alertness: Awake/alert Behavior During Therapy: Flat affect Overall Cognitive Status: History of cognitive impairments - at baseline                                 General Comments: moderate accuracy with response to yes/no commands                           Pertinent Vitals/ Pain       Pain Assessment: Faces Pain Score: 0-No pain  Home Living     Available Help at Discharge: Family;Available 24 hours/day Type of Home: House Home Access: Stairs to enter [employer] of Steps: 5   Home Layout: One level;Two level;Able to live on main level with bedroom/bathroom;Full bath on main level                      Lives With: Family                  Frequency  Min 2X/week        Progress Toward Goals  OT Goals(current goals can now be found in the care plan section)  Progress towards OT goals: Progressing toward goals  Acute Rehab OT Goals Patient Stated Goal: Pt did not participate in goal setting. OT Goal Formulation: Patient unable to participate in goal setting Time For Goal Achievement: 06/17/21 Potential to Achieve Goals: Fair ADL Goals Pt Will Perform Eating: with min assist;with adaptive utensils;bed level Pt Will Perform Grooming: standing;with adaptive equipment;with min assist;with mod assist Pt Will Perform Upper Body Dressing: with min guard assist;sitting Pt Will Perform Lower Body Dressing: with mod assist;sitting/lateral leans;sit to/from stand;with adaptive equipment Pt Will Transfer to Toilet: with min guard assist;stand pivot transfer Pt/caregiver will Perform Home Exercise Program: Increased ROM;Both right and left upper extremity;With minimal assist  Plan Discharge plan remains appropriate                                    End of Session Equipment Utilized During Treatment: Rolling walker  OT Visit Diagnosis: Unsteadiness on feet (R26.81);Muscle weakness (generalized) (M62.81)   Activity Tolerance Patient tolerated treatment well   Patient Left in bed;with call bell/phone within reach;with bed alarm set   Nurse Communication Other (comment) (notified pt put in bed)        Time: 2536-6440 OT Time Calculation (min): 21 min  Charges: OT General Charges $OT  Visit: 1 Visit OT Treatments $Self Care/Home Management : 8-22 mins  Joshua Morales OT, MOT  Joshua Morales 06/04/2021, 11:48 AM

## 2021-06-04 NOTE — Progress Notes (Signed)
ANTICOAGULATION CONSULT NOTE -   Pharmacy Consult for Xarelto=> Lovenox and Coumadin Indication:  CVA  No Known Allergies  Patient Measurements: Height: 6\' 1"  (185.4 cm) Weight: 62.7 kg (138 lb 3.7 oz) IBW/kg (Calculated) : 79.9  Vital Signs: Temp: 99.4 F (37.4 C) (09/30 0402) Temp Source: Oral (09/30 0402) BP: 167/77 (09/30 0402) Pulse Rate: 86 (09/30 0402)  Labs: Recent Labs    06/02/21 0907 06/02/21 1041 06/03/21 0526 06/03/21 1419 06/04/21 0545  HGB 8.8*  --   --   --  9.6*  HCT 28.2*  --   --   --  29.3*  PLT 164  --   --   --  169  LABPROT  --   --   --  14.4 14.7  INR  --   --   --  1.1 1.2  CREATININE 1.92*  --  2.01*  --  2.01*  TROPONINIHS 238* 236*  --   --   --      Estimated Creatinine Clearance: 29 mL/min (A) (by C-G formula based on SCr of 2.01 mg/dL (H)).   Medical History: Past Medical History:  Diagnosis Date   Agent orange exposure    Atrial thrombus 05/05/2021   CRD (chronic renal disease), stage 3b (HCC) 05/05/2021   Dementia (HCC)    Diabetes mellitus without complication (HCC)    Hypertension    Macrocytic anemia 05/05/2021    Medications:  Medications Prior to Admission  Medication Sig Dispense Refill Last Dose   ACIDOPHILUS LACTOBACILLUS PO Take 2 capsules by mouth in the morning and at bedtime.   06/01/2021   atenolol (TENORMIN) 25 MG tablet Take 1 tablet (25 mg total) by mouth daily. 30 tablet 1 06/01/2021 at 0800   atorvastatin (LIPITOR) 40 MG tablet Take 40 mg by mouth daily.   06/01/2021   Cholecalciferol (D3-1000) 25 MCG (1000 UT) tablet Take 2,000 Units by mouth daily.   06/01/2021   donepezil (ARICEPT) 5 MG tablet Take 1 tablet (5 mg total) by mouth at bedtime. 30 tablet 1 06/01/2021   folic acid (FOLVITE) 1 MG tablet Take 1 tablet (1 mg total) by mouth daily. 30 tablet 1 06/01/2021   hydrOXYzine (ATARAX/VISTARIL) 10 MG tablet Take 10 mg by mouth 2 (two) times daily as needed for itching.   06/01/2021   insulin aspart (NOVOLOG) 100  UNIT/ML FlexPen Inject 2 Units into the skin 3 (three) times daily with meals. IF EATS 50% OR MORE OF MEAL. (Patient taking differently: Inject 4 Units into the skin 3 (three) times daily with meals. IF EATS 50% OR MORE OF MEAL.) 15 mL 0 06/01/2021   insulin detemir (LEVEMIR) 100 UNIT/ML FlexPen Inject 8 Units into the skin daily.   06/01/2021   Insulin Pen Needle 31G X 5 MM MISC 1 Device by Does not apply route as directed. 90 each 2    memantine (NAMENDA) 5 MG tablet Take 1 tablet (5 mg total) by mouth 2 (two) times daily. 60 tablet 1 06/01/2021   mirtazapine (REMERON) 15 MG tablet Take 15 mg by mouth at bedtime.   06/01/2021   Multiple Vitamin (MULTIVITAMIN WITH MINERALS) TABS tablet Take 1 tablet by mouth daily.   06/01/2021   QUEtiapine (SEROQUEL) 25 MG tablet Take 1 tablet (25 mg total) by mouth at bedtime. 30 tablet 0 06/01/2021   Rivaroxaban (XARELTO) 15 MG TABS tablet Take 1 tablet (15 mg total) by mouth daily with supper. 30 tablet 5 06/01/2021 at 1800   thiamine  100 MG tablet Take 1 tablet (100 mg total) by mouth daily. 30 tablet 1 06/01/2021   hydrALAZINE (APRESOLINE) 50 MG tablet Take 1 tablet (50 mg total) by mouth 3 (three) times daily. (Patient not taking: Reported on 06/02/2021) 90 tablet 5 Not Taking   polyethylene glycol (MIRALAX / GLYCOLAX) 17 g packet Take 17 g by mouth daily as needed for mild constipation. (Patient not taking: Reported on 06/02/2021) 14 each 0 Not Taking    Assessment:  Patient was admitted for altered mental status and found to have acute multifocal punctate infarcts in bilateral cerebral hemispheres and cerebellum, indicating a cardioembolic source. He is currently on xarelto for atrial thrombus discovered in August.  Repeat TTE.With failure of treatment on xarelto and previous failure on apixaban, MD's and family have decided to anticoagulate with Coumadin. Will bridge coumadin with lovenox, starting 24 hours after last xarelto dose on 9/28 at 1730. Continue coumadin  and lovenox until INR> 2. CrCl 29 mls/min, borderline. Consider anti-Xa levels. INR 1.2, subtherapeutic.   Goal of Therapy:  INR 2-3 Monitor platelets by anticoagulation protocol: Yes   Plan:  Enoxaparin 1mg /kg sq q24 h (until INR> 2) Coumadin 5mg  po x 1 today Daily PT- INR Monitor CBC daily, and s/s of bleeding  , BS , BCPS Clinical Pharmacist Pager [phone] 06/04/2021,8:02 AM

## 2021-06-04 NOTE — Procedures (Signed)
[facility] Joshua Miron A. Gerilyn Pilgrim, MD     [email or web address]           HISTORY: The patient presents with recurrent episodes of staring and unresponsiveness suspicious for nonconvulsive seizures.  MEDICATIONS:  Current Facility-Administered Medications:    0.9 %  sodium chloride infusion, , Intravenous, Continuous, Johnson, Clanford L, MD, Last Rate: 30 mL/hr at 06/03/21 1826, Rate Change at 06/03/21 1826   acetaminophen (TYLENOL) tablet 650 mg, 650 mg, Oral, Q4H PRN **OR** acetaminophen (TYLENOL) 160 MG/5ML solution 650 mg, 650 mg, Per Tube, Q4H PRN **OR** acetaminophen (TYLENOL) suppository 650 mg, 650 mg, Rectal, Q4H PRN, Johnson, Clanford L, MD   amLODipine (NORVASC) tablet 5 mg, 5 mg, Oral, Daily, Johnson, Clanford L, MD, 5 mg at 06/04/21 1504   atenolol (TENORMIN) tablet 25 mg, 25 mg, Oral, Daily, Johnson, Clanford L, MD, 25 mg at 06/04/21 1505   atorvastatin (LIPITOR) tablet 80 mg, 80 mg, Oral, QPM, Johnson, Clanford L, MD, 80 mg at 06/04/21 1712   cholecalciferol (VITAMIN D3) tablet 2,000 Units, 2,000 Units, Oral, Daily, Johnson, Clanford L, MD, 2,000 Units at 06/04/21 1104   donepezil (ARICEPT) tablet 5 mg, 5 mg, Oral, QHS, Johnson, Clanford L, MD, 5 mg at 06/03/21 2204   [START ON 06/05/2021] enoxaparin (LOVENOX) injection 60 mg, 60 mg, Subcutaneous, Q24H, Johnson, Clanford L, MD   feeding supplement (ENSURE ENLIVE / ENSURE PLUS) liquid 237 mL, 237 mL, Oral, BID BM, Johnson, Clanford L, MD, 237 mL at 06/04/21 1505   folic acid (FOLVITE) tablet 1 mg, 1 mg, Oral, Daily, Johnson, Clanford L, MD, 1 mg at 06/04/21 1103   hydrOXYzine (ATARAX/VISTARIL) tablet 10 mg, 10 mg, Oral, BID PRN, Johnson, Clanford L, MD   insulin aspart (novoLOG) injection 0-5 Units, 0-5 Units, Subcutaneous, QHS, Johnson, Clanford L, MD   insulin aspart (novoLOG) injection 0-9 Units, 0-9 Units, Subcutaneous, TID WC, Johnson, Clanford L, MD, 3 Units at 06/04/21 0851   insulin aspart (novoLOG) injection 4  Units, 4 Units, Subcutaneous, TID WC, Johnson, Clanford L, MD, 4 Units at 06/04/21 0851   insulin detemir (LEVEMIR) injection 8 Units, 8 Units, Subcutaneous, Daily, Johnson, Clanford L, MD, 8 Units at 06/04/21 1104   memantine (NAMENDA) tablet 5 mg, 5 mg, Oral, BID, Johnson, Clanford L, MD, 5 mg at 06/04/21 1103   mirtazapine (REMERON) tablet 15 mg, 15 mg, Oral, QHS, Johnson, Clanford L, MD, 15 mg at 06/03/21 2204   multivitamin with minerals tablet 1 tablet, 1 tablet, Oral, Daily, Johnson, Clanford L, MD, 1 tablet at 06/04/21 1103   QUEtiapine (SEROQUEL) tablet 25 mg, 25 mg, Oral, QHS, Johnson, Clanford L, MD, 25 mg at 06/03/21 2203   senna-docusate (Senokot-S) tablet 1 tablet, 1 tablet, Oral, QHS PRN, Johnson, Clanford L, MD   thiamine tablet 100 mg, 100 mg, Oral, Daily, Johnson, Clanford L, MD, 100 mg at 06/04/21 1104   Warfarin - Pharmacist Dosing Inpatient, , Does not apply, q1600, Johnson, Clanford L, MD     ANALYSIS: A 16 channel recording using standard 10 20 measurements is conducted for 23 minutes.  The background activity gets as high as 7 hertz. There is beta activity observed in the frontal areas. There is normal myogenic interference noted. Photic stimulation and hyperventilation are not conducted. There is no focal or lateral slowing. There is no epileptiform activities noted. There is bifrontal episodic slowing noted.   IMPRESSION: Mild global slowing is noted. No epileptiform activities noted.      Joshua Morales A. Gerilyn Morales, M.D.  Diplomate,  American Board of Psychiatry and Neurology ( Neurology).

## 2021-06-05 LAB — BASIC METABOLIC PANEL
Anion gap: 7 (ref 5–15)
BUN: 33 mg/dL — ABNORMAL HIGH (ref 8–23)
CO2: 25 mmol/L (ref 22–32)
Calcium: 8.7 mg/dL — ABNORMAL LOW (ref 8.9–10.3)
Chloride: 106 mmol/L (ref 98–111)
Creatinine, Ser: 2.04 mg/dL — ABNORMAL HIGH (ref 0.61–1.24)
GFR, Estimated: 34 mL/min — ABNORMAL LOW (ref >60)
Glucose, Bld: 93 mg/dL (ref 70–99)
Potassium: 4.1 mmol/L (ref 3.5–5.1)
Sodium: 138 mmol/L (ref 135–145)

## 2021-06-05 LAB — CBC
HCT: 25 % — ABNORMAL LOW (ref 39.0–52.0)
Hemoglobin: 8 g/dL — ABNORMAL LOW (ref 13.0–17.0)
MCH: 32.4 pg (ref 26.0–34.0)
MCHC: 32 g/dL (ref 30.0–36.0)
MCV: 101.2 fL — ABNORMAL HIGH (ref 80.0–100.0)
Platelets: 139 10*3/uL — ABNORMAL LOW (ref 150–400)
RBC: 2.47 MIL/uL — ABNORMAL LOW (ref 4.22–5.81)
RDW: 13.8 % (ref 11.5–15.5)
WBC: 12 10*3/uL — ABNORMAL HIGH (ref 4.0–10.5)
nRBC: 0 % (ref 0.0–0.2)

## 2021-06-05 LAB — MAGNESIUM: Magnesium: 1.9 mg/dL (ref 1.7–2.4)

## 2021-06-05 LAB — GLUCOSE, CAPILLARY
Glucose-Capillary: 82 mg/dL (ref 70–99)
Glucose-Capillary: 108 mg/dL — ABNORMAL HIGH (ref 70–99)
Glucose-Capillary: 251 mg/dL — ABNORMAL HIGH (ref 70–99)

## 2021-06-05 LAB — PROTIME-INR
INR: 1.2 (ref 0.8–1.2)
Prothrombin Time: 15.4 seconds — ABNORMAL HIGH (ref 11.4–15.2)

## 2021-06-05 LAB — HOMOCYSTEINE: Homocysteine: 13.8 umol/L (ref 0.0–19.2)

## 2021-06-05 MED ORDER — ATORVASTATIN CALCIUM 80 MG PO TABS: 80.0000 mg | ORAL_TABLET | Freq: Every evening | ORAL | 1 refills | Status: AC

## 2021-06-05 MED ORDER — HYDRALAZINE HCL 25 MG PO TABS
25.0000 mg | ORAL_TABLET | Freq: Three times a day (TID) | ORAL | Status: DC
  Administered 2021-06-05: 25 mg via ORAL

## 2021-06-05 MED ORDER — WARFARIN SODIUM 5 MG PO TABS: 5.0000 mg | ORAL_TABLET | Freq: Every day | ORAL | 0 refills | Status: AC

## 2021-06-05 MED ORDER — INSULIN ASPART 100 UNIT/ML FLEXPEN: 4.0000 [IU] | PEN_INJECTOR | Freq: Three times a day (TID) | SUBCUTANEOUS | Status: AC

## 2021-06-05 MED ORDER — ENOXAPARIN SODIUM 60 MG/0.6ML IJ SOSY: 60.0000 mg | PREFILLED_SYRINGE | INTRAMUSCULAR | 0 refills | Status: AC

## 2021-06-05 MED ORDER — HYDRALAZINE HCL 25 MG PO TABS: 25.0000 mg | ORAL_TABLET | Freq: Three times a day (TID) | ORAL | 1 refills | Status: AC

## 2021-06-05 MED ORDER — AMLODIPINE BESYLATE 10 MG PO TABS: 10.0000 mg | ORAL_TABLET | Freq: Every day | ORAL | 1 refills | Status: AC

## 2021-06-05 MED ORDER — AMLODIPINE BESYLATE 5 MG PO TABS
10.0000 mg | ORAL_TABLET | Freq: Every day | ORAL | Status: DC
  Administered 2021-06-05: 10 mg via ORAL
  Filled 2021-06-05: qty 2

## 2021-06-05 MED ORDER — WARFARIN SODIUM 7.5 MG PO TABS: 7.5000 mg | ORAL_TABLET | Freq: Once | ORAL | Status: DC

## 2021-06-05 NOTE — TOC Transition Note (Addendum)
Transition of Care [facility]) - CM/SW Discharge Note   Patient Details  Name: Joshua Morales MRN: [id number] Date of Birth: 1946-02-03  Transition of [facility] CM/SW Contact:  Leitha Bleak, RN Phone Number: 06/05/2021, 11:24 AM   Clinical Narrative:   Patient ready to discharge home. [facility] Orders placed for RN/PT, [place] updated.  Final next level of care: Home w Home Health Services Barriers to Discharge: Barriers Resolved  Patient Goals and CMS Choice Patient states their goals for this hospitalization and ongoing recovery are:: Return home with [facility] CMS Medicare.gov Compare Post Acute Care list provided to:: Patient Choice offered to / list presented to : Patient  Discharge Placement                Name of family member notified: Family Patient and family notified of of transfer: 06/05/21  Discharge Plan and Services      HH Arranged: RN, PT   Date [facility] Agency Contacted: 06/03/21 Time HH Agency Contacted: [phone] Representative spoke with at [facility] Agency: Linda  Readmission Risk Interventions Readmission Risk Prevention Plan 06/04/2021 05/27/2021  Transportation Screening Complete Complete  HRI or Home Care Consult Complete Complete  Social Work Consult for Recovery Care Planning/Counseling Complete -  Palliative Care Screening Not Applicable Not Applicable  Medication Review Oceanographer) Complete Complete  Some recent data might be hidden

## 2021-06-05 NOTE — Progress Notes (Signed)
ANTICOAGULATION CONSULT NOTE -   Pharmacy Consult for Xarelto=> Lovenox and Coumadin Indication:  CVA  No Known Allergies  Patient Measurements: Height: 6\' 1"  (185.4 cm) Weight: 62.7 kg (138 lb 3.7 oz) IBW/kg (Calculated) : 79.9  Vital Signs: Temp: 99 F (37.2 C) (10/01 0546) Temp Source: Oral (10/01 0546) BP: 164/68 (10/01 0546) Pulse Rate: 78 (10/01 0546)  Labs: Recent Labs    06/02/21 0907 06/02/21 1041 06/03/21 0526 06/03/21 1419 06/04/21 0545 06/05/21 0533  HGB 8.8*  --   --   --  9.6* 8.0*  HCT 28.2*  --   --   --  29.3* 25.0*  PLT 164  --   --   --  169 139*  LABPROT  --   --   --  14.4 14.7 15.4*  INR  --   --   --  1.1 1.2 1.2  CREATININE 1.92*  --  2.01*  --  2.01* 2.04*  TROPONINIHS 238* 236*  --   --   --   --      Estimated Creatinine Clearance: 28.6 mL/min (A) (by C-G formula based on SCr of 2.04 mg/dL (H)).   Medical History: Past Medical History:  Diagnosis Date   Agent orange exposure    Atrial thrombus 05/05/2021   CRD (chronic renal disease), stage 3b (HCC) 05/05/2021   Dementia (HCC)    Diabetes mellitus without complication (HCC)    Hypertension    Macrocytic anemia 05/05/2021    Medications:  Medications Prior to Admission  Medication Sig Dispense Refill Last Dose   ACIDOPHILUS LACTOBACILLUS PO Take 2 capsules by mouth in the morning and at bedtime.   06/01/2021   atenolol (TENORMIN) 25 MG tablet Take 1 tablet (25 mg total) by mouth daily. 30 tablet 1 06/01/2021 at 0800   atorvastatin (LIPITOR) 40 MG tablet Take 40 mg by mouth daily.   06/01/2021   Cholecalciferol (D3-1000) 25 MCG (1000 UT) tablet Take 2,000 Units by mouth daily.   06/01/2021   donepezil (ARICEPT) 5 MG tablet Take 1 tablet (5 mg total) by mouth at bedtime. 30 tablet 1 06/01/2021   folic acid (FOLVITE) 1 MG tablet Take 1 tablet (1 mg total) by mouth daily. 30 tablet 1 06/01/2021   hydrOXYzine (ATARAX/VISTARIL) 10 MG tablet Take 10 mg by mouth 2 (two) times daily as needed for  itching.   06/01/2021   insulin aspart (NOVOLOG) 100 UNIT/ML FlexPen Inject 2 Units into the skin 3 (three) times daily with meals. IF EATS 50% OR MORE OF MEAL. (Patient taking differently: Inject 4 Units into the skin 3 (three) times daily with meals. IF EATS 50% OR MORE OF MEAL.) 15 mL 0 06/01/2021   insulin detemir (LEVEMIR) 100 UNIT/ML FlexPen Inject 8 Units into the skin daily.   06/01/2021   Insulin Pen Needle 31G X 5 MM MISC 1 Device by Does not apply route as directed. 90 each 2    memantine (NAMENDA) 5 MG tablet Take 1 tablet (5 mg total) by mouth 2 (two) times daily. 60 tablet 1 06/01/2021   mirtazapine (REMERON) 15 MG tablet Take 15 mg by mouth at bedtime.   06/01/2021   Multiple Vitamin (MULTIVITAMIN WITH MINERALS) TABS tablet Take 1 tablet by mouth daily.   06/01/2021   QUEtiapine (SEROQUEL) 25 MG tablet Take 1 tablet (25 mg total) by mouth at bedtime. 30 tablet 0 06/01/2021   Rivaroxaban (XARELTO) 15 MG TABS tablet Take 1 tablet (15 mg total) by mouth daily  with supper. 30 tablet 5 06/01/2021 at 1800   thiamine 100 MG tablet Take 1 tablet (100 mg total) by mouth daily. 30 tablet 1 06/01/2021   hydrALAZINE (APRESOLINE) 50 MG tablet Take 1 tablet (50 mg total) by mouth 3 (three) times daily. (Patient not taking: Reported on 06/02/2021) 90 tablet 5 Not Taking   polyethylene glycol (MIRALAX / GLYCOLAX) 17 g packet Take 17 g by mouth daily as needed for mild constipation. (Patient not taking: Reported on 06/02/2021) 14 each 0 Not Taking    Assessment:  Patient was admitted for altered mental status and found to have acute multifocal punctate infarcts in bilateral cerebral hemispheres and cerebellum, indicating a cardioembolic source. He is currently on xarelto for atrial thrombus discovered in August.  Repeat TTE.With failure of treatment on xarelto and previous failure on apixaban, MD's and family have decided to anticoagulate with Coumadin. Will bridge coumadin with lovenox, starting 24 hours after  last xarelto dose on 9/28 at 1730. Continue coumadin and lovenox until INR> 2. CrCl 29 mls/min, borderline. Consider anti-Xa levels.  INR 1.2, subtherapeutic.   Hgb 8.0  Goal of Therapy:  INR 2-3 Monitor platelets by anticoagulation protocol: Yes   Plan:  Enoxaparin 1mg /kg sq q24 h (until INR> 2) Coumadin 7.5mg  po x 1 today Daily PT- INR Monitor CBC daily, and s/s of bleeding  , PharmD Clinical Pharmacist 06/05/2021 8:54 AM

## 2021-06-05 NOTE — Discharge Instructions (Addendum)
RN to check PT/INR on 10/3, 10/5, 10/7 and report results to PCP at [facility] Dr. Dorrene German. STOP LOVENOX when INR>2.  PLEASE FOLLOW UP AT [facility] IN NEXT WEEK FOR RECHECK ON WARFARIN.     IMPORTANT INFORMATION: PAY CLOSE ATTENTION   PHYSICIAN DISCHARGE INSTRUCTIONS  Follow with Primary care provider  Center, [facility]  and other consultants as instructed by your Hospitalist Physician  SEEK MEDICAL CARE OR RETURN TO EMERGENCY ROOM IF SYMPTOMS COME BACK, WORSEN OR NEW PROBLEM DEVELOPS   Please note: You were cared for by a hospitalist during your hospital stay. Every effort will be made to forward records to your primary care provider.  You can request that your primary care provider send for your hospital records if they have not received them.  Once you are discharged, your primary care physician will handle any further medical issues. Please note that NO REFILLS for any discharge medications will be authorized once you are discharged, as it is imperative that you return to your primary care physician (or establish a relationship with a primary care physician if you do not have one) for your post hospital discharge needs so that they can reassess your need for medications and monitor your lab values.  Please get a complete blood count and chemistry panel checked by your Primary MD at your next visit, and again as instructed by your Primary MD.  Get Medicines reviewed and adjusted: Please take all your medications with you for your next visit with your Primary MD  Laboratory/radiological data: Please request your Primary MD to go over all hospital tests and procedure/radiological results at the follow up, please ask your primary care provider to get all Hospital records sent to his/her office.  In some cases, they will be blood work, cultures and biopsy results pending at the time of your discharge. Please request that your primary care provider follow up on  these results.  If you are diabetic, please bring your blood sugar readings with you to your follow up appointment with primary care.    Please call and make your follow up appointments as soon as possible.    Also Note the following: If you experience worsening of your admission symptoms, develop shortness of breath, life threatening emergency, suicidal or homicidal thoughts you must seek medical attention immediately by calling 911 or calling your MD immediately  if symptoms less severe.  You must read complete instructions/literature along with all the possible adverse reactions/side effects for all the Medicines you take and that have been prescribed to you. Take any new Medicines after you have completely understood and accpet all the possible adverse reactions/side effects.   Do not drive when taking Pain medications or sleeping medications (Benzodiazepines)  Do not take more than prescribed Pain, Sleep and Anxiety Medications. It is not advisable to combine anxiety,sleep and pain medications without talking with your primary care practitioner  Special Instructions: If you have smoked or chewed Tobacco  in the last 2 yrs please stop smoking, stop any regular Alcohol  and or any Recreational drug use.  Wear Seat belts while driving.  Do not drive if taking any narcotic, mind altering or controlled substances or recreational drugs or alcohol.

## 2021-06-05 NOTE — Discharge Summary (Addendum)
Physician Discharge Summary  [name] GMW:[id number] DOB: 08-Apr-1946 DOA: 06/02/2021  PCP: [facility]: Dr. Gerilyn Pilgrim  Admit date: 06/02/2021 Discharge date: 06/05/2021  Admitted From:  Home  Disposition: Home with Home health (Family declined SNF placement)  Recommendations for Outpatient Follow-up:  Follow up with PCP in 1 weeks Please follow up with neurology in 1 month Please obtain CBC/PT/INR in one week Please have patient wear heel protection at all times.   RN to check PT/INR on 10/3, 10/5, 10/7 and report results to PCP at [facility] Dr. Dorrene German. STOP LOVENOX when INR>2.  PLEASE FOLLOW UP AT [facility] IN NEXT WEEK FOR RECHECK ON WARFARIN.    Home Health: PT, RN   Discharge Condition: STABLE   CODE STATUS: FULL DIET: heart healthy/carb modified     Brief Hospitalization Summary: Please see all hospital notes, images, labs for full details of the hospitalization. ADMISSION HPI: RUSELL Morales is a 75 y.o. male with medical history significant of dementia, htn, CKD3b, DM, atrial thrombus, anticoagulant use, and previous CVA's. Per the patient's daughter he was slow to respond this morning and has bilateral lower extremity edema limited to the ankles and feet. He is hypertensive, 190/73, hyperglycemic 277, slightly bradycardic with an occasional irregularity, ekg consistent with his previous hospitalization, sinus rhythm rate of 55, without st-segment changes. Recently he had a holter monitor after his most recent cva. A brief 4 beat run of vtach is reported, a four beat run of svt.  Pt was admitted with acute ischemic CVA thought cardioembolic.   HOSPITAL COURSE  Patient was admitted with acute CVA recurrent with multiple punctate acute to subacute infarcts in the bilateral cerebral hemispheres and cerebellum indicating a cardioembolic source.  He had been taking Xarelto 15 mg daily and had failed apixaban in the past.  After  discussing with cardiology and neurology decision was made to start warfarin for full anticoagulation.  Patient has been started on Lovenox warfarin bridge and will discharge home on enoxaparin injections until INR is therapeutic with instructions to stop enoxaparin when INR greater than 2.  We have made arrangements for home health care nurse to check PT/INR on 10 3, 10 5 and 10 7 and report results to PCP at the [facility].  The patient also was advised to follow-up with the anticoagulation clinic at the [facility].  He had already been scheduled to go to the anticoagulation clinic and I have asked him to reschedule the appointment for 1 week.  His LDL cholesterol was suboptimal and we increased the atorvastatin to 80 mg daily.  Patient was noted to have a blister on the right heel and placed in Prevalon boot and recommendation is for him to continue wearing heel protection at all times.  Family strongly advised to have close follow-up with PCP outpatient.  Home health has been arranged prior to discharge.  Education for family given for enoxaparin injections prior to discharge.  Also gave family instructions regarding enoxaparin injections and to stop when INR greater than 2.  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   amLODipine 10 MG tablet Commonly known as: NORVASC Take 1 tablet (10 mg total) by mouth daily. Start taking on: June 06, 2021   atenolol 25 MG tablet Commonly known as: TENORMIN Take 1 tablet (25 mg total) by mouth daily.   atorvastatin 80 MG tablet Commonly known as: LIPITOR Take 1 tablet (80 mg total) by mouth every evening. What changed:  medication strength how much to take when to take this   D3-1000 25 MCG (1000 UT) tablet Generic drug: Cholecalciferol Take 2,000 Units by mouth daily.   donepezil 5 MG tablet Commonly known as: ARICEPT Take 1 tablet (5 mg total) by mouth at bedtime.   enoxaparin 60 MG/0.6ML injection Commonly known as: LOVENOX Inject 0.6 mLs (60 mg total) into the skin daily for 7 days. STOP ENOXAPARIN WHEN INR >2 Start taking on: June 06, 2021   folic acid 1 MG tablet Commonly known as: FOLVITE Take 1 tablet (1 mg total) by mouth daily.   hydrALAZINE 25 MG tablet Commonly known as: APRESOLINE Take 1 tablet (25 mg total) by mouth 3 (three) times daily. What changed:  medication strength how much to take   hydrOXYzine 10 MG tablet Commonly known as: ATARAX/VISTARIL Take 10 mg by mouth 2 (two) times daily as needed for itching.   insulin aspart 100 UNIT/ML FlexPen Commonly known as: NOVOLOG Inject 4 Units into the skin 3 (three) times daily with meals. IF EATS 50% OR MORE OF MEAL.   insulin detemir 100 UNIT/ML FlexPen Commonly known as: LEVEMIR Inject 8 Units into the skin daily.   Insulin Pen Needle 31G X 5 MM Misc 1 Device by Does not apply route as directed.   memantine 5 MG tablet Commonly known as: NAMENDA Take 1 tablet (5 mg total) by mouth 2 (two) times daily.   mirtazapine 15 MG tablet Commonly known as: REMERON Take 15 mg by mouth at bedtime.   multivitamin with minerals Tabs  tablet Take 1 tablet by mouth daily.   QUEtiapine 25 MG tablet Commonly known as: SEROQUEL Take 1 tablet (25 mg total) by mouth at bedtime.   thiamine 100 MG tablet Take 1 tablet (100 mg total) by mouth daily.   warfarin 5 MG tablet Commonly known as: COUMADIN Take 1 tablet (5 mg total) by mouth daily at 4 PM. OR AS DIRECTED BY PHYSICIAN        Follow-up Information     [facility].   Specialty: Home Health Services Why: They will call to schedule home visit.        [facility] [employer] [facility]. Schedule an appointment as soon as possible for a visit in 1 week(s).   Specialty: General Practice Why: Hospital Follow Up, CHECK LABS PT/INR Contact information: [address] [place] [address] [phone]         Dorrene German MD. Schedule an appointment as soon as possible for a visit in 1 week(s).   Why: Hospital Follow Up Contact information: [facility]        Beryle Beams, MD. Schedule an appointment as soon as possible for a visit in 1 month(s).   Specialty: Neurology Why: Hospital Follow Up Contact information: [address] [place] [address] [phone]                No Known Allergies Allergies as of 06/05/2021   No Known Allergies      Medication List     STOP taking these medications    polyethylene glycol 17 g packet Commonly known as: MIRALAX / GLYCOLAX   Rivaroxaban 15 MG Tabs tablet Commonly known as: XARELTO       TAKE these medications    ACIDOPHILUS LACTOBACILLUS PO Take 2 capsules by mouth in the morning and at bedtime.   amLODipine 10 MG tablet Commonly known as: NORVASC Take 1 tablet (10 mg total) by mouth daily. Start taking on: June 06, 2021   atenolol 25 MG tablet Commonly known as: TENORMIN Take 1 tablet (25 mg total) by mouth daily.   atorvastatin 80 MG tablet Commonly known as: LIPITOR Take 1 tablet (80 mg total) by mouth every evening. What changed:  medication  strength how much to take when to take this   D3-1000 25 MCG (1000 UT) tablet Generic drug: Cholecalciferol Take 2,000 Units by mouth daily.   donepezil 5 MG tablet Commonly known as: ARICEPT Take 1 tablet (5 mg total) by mouth at bedtime.   enoxaparin 60 MG/0.6ML injection Commonly known as: LOVENOX Inject 0.6 mLs (60 mg total) into the skin daily for 7 days. STOP ENOXAPARIN WHEN INR >2 Start taking on: June 06, 2021   folic acid 1 MG tablet Commonly known as: FOLVITE Take 1 tablet (1 mg total) by mouth daily.   hydrALAZINE 25 MG tablet Commonly known as: APRESOLINE Take 1 tablet (25 mg total) by mouth 3 (three) times daily. What changed:  medication strength how much to take   hydrOXYzine 10 MG tablet Commonly known as: ATARAX/VISTARIL Take 10 mg by mouth 2 (two) times daily as needed for itching.   insulin aspart 100 UNIT/ML FlexPen Commonly known as: NOVOLOG Inject 4 Units into the skin 3 (three) times daily with meals. IF EATS 50% OR MORE OF MEAL.   insulin detemir 100 UNIT/ML FlexPen Commonly known as: LEVEMIR Inject 8 Units into the skin daily.   Insulin Pen Needle 31G X 5 MM Misc 1 Device by Does not apply route as directed.   memantine 5 MG tablet Commonly known as: NAMENDA Take 1 tablet (5 mg total) by mouth 2 (two) times daily.   mirtazapine 15 MG tablet Commonly known as: REMERON Take 15 mg by mouth at bedtime.   multivitamin with minerals Tabs tablet Take 1 tablet by mouth daily.   QUEtiapine 25 MG tablet Commonly known as: SEROQUEL Take 1 tablet (25 mg total) by mouth at bedtime.   thiamine 100 MG tablet Take 1 tablet (100 mg total) by mouth daily.   warfarin 5 MG tablet Commonly known as: COUMADIN Take 1 tablet (5 mg total) by mouth daily at 4 PM. OR AS DIRECTED BY PHYSICIAN        Procedures/Studies: CT Head Wo Contrast  Result Date: 06/02/2021 CLINICAL DATA:  Altered mental status EXAM: CT HEAD WITHOUT CONTRAST TECHNIQUE:  Contiguous axial images were obtained from the base of the skull through the vertex without intravenous contrast. COMPARISON:  CT head 05/24/2021, brain MRI 05/25/2021 FINDINGS: Brain: There is no evidence of acute intracranial  hemorrhage, extra-axial fluid collection, or acute territorial infarct. There is hypodensity in the right aspect of the splenium of the corpus callosum and left basal ganglia corresponding to the infarcts seen on the prior MRI of 05/25/2021. The other small infarcts seen on that study are not well delineated by CT, but there is no evidence of hemorrhagic transformation of any of these infarcts. There is unchanged global parenchymal volume loss with commensurate enlargement of the ventricular system. There is confluent hypodensity in the subcortical and periventricular white matter likely reflecting sequela of chronic white matter microangiopathy, unchanged. There is no mass lesion. There is no midline shift. Vascular: There is calcification of the bilateral cavernous ICAs. Skull: Normal. Negative for fracture or focal lesion. Sinuses/Orbits: The imaged paranasal sinuses are clear. A left lens implant is noted. The globes and orbits are otherwise unremarkable. Other: None. IMPRESSION: 1. No acute intracranial hemorrhage or new acute infarct. 2. Hypodensity in the right aspect of the splenium of the corpus callosum and left basal ganglia corresponding to infarcts seen on the prior MRI of 05/25/2021. The other small infarcts seen on that study are not well delineated on the current study, but there is no evidence of hemorrhagic transformation. Electronically Signed   By: Lesia Hausen M.D.   On: 06/02/2021 12:04   CT Head Wo Contrast  Result Date: 05/24/2021 CLINICAL DATA:  Altered mental status. EXAM: CT HEAD WITHOUT CONTRAST TECHNIQUE: Contiguous axial images were obtained from the base of the skull through the vertex without intravenous contrast. COMPARISON:  April 05, 2021 FINDINGS:  Brain: There is mild cerebral atrophy with widening of the extra-axial spaces and ventricular dilatation. There are areas of decreased attenuation within the white matter tracts of the supratentorial brain, consistent with microvascular disease changes. Chronic bilateral basal ganglia lacunar infarcts are seen. Vascular: No hyperdense vessel or unexpected calcification. Skull: Normal. Negative for fracture or focal lesion. Sinuses/Orbits: No acute finding. Other: None. IMPRESSION: 1. Generalized cerebral atrophy. 2. Chronic bilateral basal ganglia lacunar infarcts. 3. No acute intracranial abnormality. Electronically Signed   By: Aram Candela M.D.   On: 05/24/2021 15:13   MR ANGIO HEAD WO CONTRAST  Result Date: 06/02/2021 CLINICAL DATA:  Bilateral leg swelling, slow to respond started this morning EXAM: MRI HEAD WITHOUT CONTRAST MRA HEAD WITHOUT CONTRAST TECHNIQUE: Multiplanar, multi-echo pulse sequences of the brain and surrounding structures were acquired without intravenous contrast. Angiographic images of the Circle of Willis were acquired using MRA technique without intravenous contrast. COMPARISON:  Same-day noncontrast CT head: Brain MRI 05/25/2021 FINDINGS: MRI HEAD FINDINGS Brain: There are small foci of diffusion restriction in the right parietal and left frontal lobes which were not present on the prior MRI of 05/25/2021, consistent with new small acute infarcts. Additional punctate acute infarct is seen in the right cerebellar hemisphere. There is ongoing evolution of the additional small infarcts seen on the study from 05/25/2021, with overall decreased diffusion restriction. There is no evidence of acute intracranial hemorrhage. There are punctate chronic microhemorrhages in the left occipital lobe and, left lentiform nucleus, and right aspect of the splenium of the corpus callosum, nonspecific but possibly hypertensive in etiology. SW I signal dropout is also seen in the right caudate head  more likely related to prior infarct. There is moderate global parenchymal volume loss with commensurate enlargement of the ventricular system. There is confluent FLAIR signal abnormality in the remainder of the subcortical and periventricular white matter likely reflecting sequela of chronic white matter microangiopathy, not significantly changed  in the interim. There is no mass lesion.  There is no midline shift. Vascular: See below Skull and upper cervical spine: Normal marrow signal. Sinuses/Orbits: The paranasal sinuses are clear. A left lens implant is noted. The globes and orbits are otherwise unremarkable. Other: None. MRA HEAD FINDINGS Anterior circulation: There is irregularity of the bilateral intracranial ICAs likely reflecting atherosclerotic disease resulting in mild-to-moderate stenosis bilaterally, worse on the left where there is focal moderate stenosis, similar to the prior study. The bilateral MCAs and ACAs are patent with mild multifocal irregularity. There is no aneurysm. Posterior circulation: The V4 segments of the vertebral arteries are patent. The basilar artery is patent. The bilateral PCAs are patent. Anatomic variants: None. IMPRESSION: 1. Punctate acute infarcts in the right parietal and left frontal lobes and right cerebellar hemisphere are new since 05/25/2021. 2. Ongoing evolution of additional small infarcts seen on the study from 05/25/2021. 3. No evidence of acute intracranial hemorrhage. 4. No new high-grade stenosis or occlusion of the intracranial vasculature. 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 5. Unchanged parenchymal volume loss and chronic white matter microangiopathy. Electronically Signed   By: Lesia Hausen M.D.   On: 06/02/2021 13:22   MR ANGIO HEAD WO CONTRAST  Result Date: 05/25/2021 CLINICAL DATA:  Right-sided weakness EXAM: MRI  HEAD WITHOUT CONTRAST MRA HEAD WITHOUT CONTRAST TECHNIQUE: Multiplanar, multi-echo pulse sequences of the brain and surrounding structures were acquired without intravenous contrast. Angiographic images of the Circle of Willis were acquired using MRA technique without intravenous contrast. COMPARISON:  No pertinent prior exam. FINDINGS: MRI HEAD Brain: New foci of variably reduced diffusion are present including involvement of the anterior left corpus callosum, posterior right corpus callosum, roof of the right temporal horn, inferior left basal ganglia, and right temporal subcortical white matter. Prominence of the ventricles and sulci reflects stable parenchymal volume loss. Patchy and confluent T2 hyperintensity in the supratentorial white matter is nonspecific but probably reflects stable chronic microvascular ischemic changes. No mass effect. No extra-axial collection. Vascular: Major vessel flow voids at the skull base are preserved. Skull and upper cervical spine: Normal marrow signal is preserved. Sinuses/Orbits: Paranasal sinuses are aerated. Orbits are unremarkable. Other: Sella is unremarkable.  Mastoid air cells are clear. MRA HEAD Motion artifact is present. Intracranial internal carotid arteries are patent. Apparent inferomedially directed outpouching from the right paraclinoid ICA is not confirmed on the prior study. Middle and anterior cerebral arteries are patent. Intracranial vertebral arteries, basilar artery, posterior cerebral arteries are patent. Appearance is similar to prior study. IMPRESSION: New small acute and subacute infarcts again involving the anterior circulation bilaterally. No new proximal intracranial vessel occlusion. Electronically Signed   By: Guadlupe Spanish M.D.   On: 05/25/2021 11:52   MR ANGIO NECK W WO CONTRAST  Result Date: 06/03/2021 CLINICAL DATA:  Stroke follow-up. EXAM: MRA NECK WITHOUT AND WITH CONTRAST TECHNIQUE: Multiplanar and multiecho pulse sequences of the  neck were obtained without and with intravenous contrast. Angiographic images of the neck were obtained using MRA technique without and with intravenous contrast. CONTRAST:  6.67mL GADAVIST GADOBUTROL 1 MMOL/ML IV SOLN COMPARISON:  Carotid duplex April 06, 2021. FINDINGS: The study is limited by motion artifact and phase of contrast. Aortic arch: Normal variant aortic arch branching pattern with the brachiocephalic and left common carotid arteries sharing a common origin. The visualized subclavian arteries are grossly unremarkable. Right carotid  system: Normal course and caliber without hemodynamically significant stenosis. Left carotid system: Normal course and caliber without hemodynamically significant stenosis. Vertebral arteries: Left dominant. Vertebral artery origins and V1 segments are poorly delineated due to artifact. Vertebral arteries are normal in course and caliber from the proximal V2 to the vertebrobasilar confluence without stenosis or evidence of dissection. IMPRESSION: 1. Limited study, as described above. 2. No hemodynamically significant stenosis of the major neck arteries. Electronically Signed   By: Baldemar Lenis M.D.   On: 06/03/2021 13:32   MR BRAIN WO CONTRAST  Result Date: 06/02/2021 CLINICAL DATA:  Bilateral leg swelling, slow to respond started this morning EXAM: MRI HEAD WITHOUT CONTRAST MRA HEAD WITHOUT CONTRAST TECHNIQUE: Multiplanar, multi-echo pulse sequences of the brain and surrounding structures were acquired without intravenous contrast. Angiographic images of the Circle of Willis were acquired using MRA technique without intravenous contrast. COMPARISON:  Same-day noncontrast CT head: Brain MRI 05/25/2021 FINDINGS: MRI HEAD FINDINGS Brain: There are small foci of diffusion restriction in the right parietal and left frontal lobes which were not present on the prior MRI of 05/25/2021, consistent with new small acute infarcts. Additional punctate acute infarct  is seen in the right cerebellar hemisphere. There is ongoing evolution of the additional small infarcts seen on the study from 05/25/2021, with overall decreased diffusion restriction. There is no evidence of acute intracranial hemorrhage. There are punctate chronic microhemorrhages in the left occipital lobe and, left lentiform nucleus, and right aspect of the splenium of the corpus callosum, nonspecific but possibly hypertensive in etiology. SW I signal dropout is also seen in the right caudate head more likely related to prior infarct. There is moderate global parenchymal volume loss with commensurate enlargement of the ventricular system. There is confluent FLAIR signal abnormality in the remainder of the subcortical and periventricular white matter likely reflecting sequela of chronic white matter microangiopathy, not significantly changed in the interim. There is no mass lesion.  There is no midline shift. Vascular: See below Skull and upper cervical spine: Normal marrow signal. Sinuses/Orbits: The paranasal sinuses are clear. A left lens implant is noted. The globes and orbits are otherwise unremarkable. Other: None. MRA HEAD FINDINGS Anterior circulation: There is irregularity of the bilateral intracranial ICAs likely reflecting atherosclerotic disease resulting in mild-to-moderate stenosis bilaterally, worse on the left where there is focal moderate stenosis, similar to the prior study. The bilateral MCAs and ACAs are patent with mild multifocal irregularity. There is no aneurysm. Posterior circulation: The V4 segments of the vertebral arteries are patent. The basilar artery is patent. The bilateral PCAs are patent. Anatomic variants: None. IMPRESSION: 1. Punctate acute infarcts in the right parietal and left frontal lobes and right cerebellar hemisphere are new since 05/25/2021. 2. Ongoing evolution of additional small infarcts seen on the study from 05/25/2021. 3. No evidence of acute intracranial  hemorrhage. 4. No new high-grade stenosis or occlusion of the intracranial vasculature. 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 5. Unchanged parenchymal volume loss and chronic white matter microangiopathy. Electronically Signed   By: Lesia Hausen M.D.   On: 06/02/2021 13:22   MR BRAIN WO CONTRAST  Result Date: 05/25/2021 CLINICAL DATA:  Right-sided weakness EXAM: MRI HEAD WITHOUT CONTRAST MRA HEAD WITHOUT CONTRAST TECHNIQUE: Multiplanar, multi-echo pulse sequences of the brain and surrounding structures were acquired without intravenous contrast. Angiographic images of the Circle of Willis were  acquired using MRA technique without intravenous contrast. COMPARISON:  No pertinent prior exam. FINDINGS: MRI HEAD Brain: New foci of variably reduced diffusion are present including involvement of the anterior left corpus callosum, posterior right corpus callosum, roof of the right temporal horn, inferior left basal ganglia, and right temporal subcortical white matter. Prominence of the ventricles and sulci reflects stable parenchymal volume loss. Patchy and confluent T2 hyperintensity in the supratentorial white matter is nonspecific but probably reflects stable chronic microvascular ischemic changes. No mass effect. No extra-axial collection. Vascular: Major vessel flow voids at the skull base are preserved. Skull and upper cervical spine: Normal marrow signal is preserved. Sinuses/Orbits: Paranasal sinuses are aerated. Orbits are unremarkable. Other: Sella is unremarkable.  Mastoid air cells are clear. MRA HEAD Motion artifact is present. Intracranial internal carotid arteries are patent. Apparent inferomedially directed outpouching from the right paraclinoid ICA is not confirmed on the prior study. Middle and anterior cerebral arteries are patent. Intracranial vertebral arteries,  basilar artery, posterior cerebral arteries are patent. Appearance is similar to prior study. IMPRESSION: New small acute and subacute infarcts again involving the anterior circulation bilaterally. No new proximal intracranial vessel occlusion. Electronically Signed   By: Guadlupe Spanish M.D.   On: 05/25/2021 11:52   DG Chest Port 1 View  Result Date: 06/02/2021 CLINICAL DATA:  75 year old male with history of bilateral leg swelling and altered mental status. Weakness. EXAM: PORTABLE CHEST 1 VIEW COMPARISON:  Chest x-ray 04/05/2021. FINDINGS: Lung volumes are increased with emphysematous changes. No consolidative airspace disease. No pleural effusions. No pneumothorax. No pulmonary nodule or mass noted. Pulmonary vasculature and the cardiomediastinal silhouette are within normal limits. Surgical clips project over the left hemithorax. Orthopedic fixation hardware in the lower cervical spine incompletely imaged. IMPRESSION: 1. No radiographic evidence of acute cardiopulmonary disease. 2. Emphysema. Electronically Signed   By: Trudie Reed M.D.   On: 06/02/2021 09:18   DG Foot Complete Right  Result Date: 06/02/2021 CLINICAL DATA:  rt foot wound heel EXAM: RIGHT FOOT COMPLETE - 3 VIEW COMPARISON:  None. FINDINGS: Normal alignment. No acute fracture. Ossification of the distal interosseous membrane. Osteopenia. Vascular calcifications. IMPRESSION: No malalignment or acute osseous abnormality. Vascular calcifications. Electronically Signed   By: Olive Bass M.D.   On: 06/02/2021 09:29   EEG adult  Result Date: 06/04/2021 Beryle Beams, MD     06/04/2021  8:55 PM [facility] Kofi A. 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 MEDICATIONS: Current Facility-Administered Medications:   0.9 %  sodium chloride infusion, , Intravenous, Continuous, Asaiah Scarber L, MD,  Last Rate: 30 mL/hr at 06/03/21 1826, Rate Change at 06/03/21 1826   acetaminophen (TYLENOL) tablet 650 mg, 650 mg, Oral, Q4H PRN **OR** acetaminophen (TYLENOL) 160 MG/5ML solution 650 mg, 650 mg, Per Tube, Q4H PRN **OR** acetaminophen (TYLENOL) suppository 650 mg, 650 mg, Rectal, Q4H PRN, Demosthenes Virnig L, MD   amLODipine (NORVASC) tablet 5 mg, 5 mg, Oral, Daily, Omelia Marquart L, MD, 5 mg at 06/04/21 1504   atenolol (TENORMIN) tablet 25 mg, 25 mg, Oral, Daily, Krystiana Fornes L, MD, 25 mg at 06/04/21 1505   atorvastatin (LIPITOR) tablet 80 mg, 80 mg, Oral, QPM, Baer Hinton L, MD, 80 mg at 06/04/21 1712   cholecalciferol (VITAMIN D3) tablet 2,000 Units, 2,000 Units, Oral, Daily, Mattie Nordell L, MD, 2,000 Units at 06/04/21 1104   donepezil (ARICEPT)  tablet 5 mg, 5 mg, Oral, QHS, Derion Kreiter L, MD, 5 mg at 06/03/21 2204   [START ON 06/05/2021] enoxaparin (LOVENOX) injection 60 mg, 60 mg, Subcutaneous, Q24H, Kishan Wachsmuth L, MD   feeding supplement (ENSURE ENLIVE / ENSURE PLUS) liquid 237 mL, 237 mL, Oral, BID BM, Jeric Slagel L, MD, 237 mL at 06/04/21 1505   folic acid (FOLVITE) tablet 1 mg, 1 mg, Oral, Daily, Jessy Calixte L, MD, 1 mg at 06/04/21 1103   hydrOXYzine (ATARAX/VISTARIL) tablet 10 mg, 10 mg, Oral, BID PRN, Kalem Rockwell L, MD   insulin aspart (novoLOG) injection 0-5 Units, 0-5 Units, Subcutaneous, QHS, Cana Mignano L, MD   insulin aspart (novoLOG) injection 0-9 Units, 0-9 Units, Subcutaneous, TID WC, Myrna Vonseggern, Nalaya Wojdyla L, MD, 3 Units at 06/04/21 0851   insulin aspart (novoLOG) injection 4 Units, 4 Units, Subcutaneous, TID WC, Jordan Pardini L, MD, 4 Units at 06/04/21 0851   insulin detemir (LEVEMIR) injection 8 Units, 8 Units, Subcutaneous, Daily, Cariana Karge L, MD, 8 Units at 06/04/21 1104   memantine (NAMENDA) tablet 5 mg, 5 mg, Oral, BID, Lehman Whiteley L, MD, 5 mg at 06/04/21 1103   mirtazapine (REMERON) tablet 15 mg, 15 mg, Oral, QHS, ,   L, MD, 15 mg at 06/03/21 2204   multivitamin with minerals tablet 1 tablet, 1 tablet, Oral, Daily, Trenice Mesa L, MD, 1 tablet at 06/04/21 1103   QUEtiapine (SEROQUEL) tablet 25 mg, 25 mg, Oral, QHS, Javiel Canepa L, MD, 25 mg at 06/03/21 2203   senna-docusate (Senokot-S) tablet 1 tablet, 1 tablet, Oral, QHS PRN, Yu Cragun L, MD   thiamine tablet 100 mg, 100 mg, Oral, Daily, Veronnica Hennings L, MD, 100 mg at 06/04/21 1104   Warfarin - Pharmacist Dosing Inpatient, , Does not apply, q1600, Britton Perkinson L, MD ANALYSIS: A 16 channel recording using standard 10 20 measurements is conducted for 23 minutes.  The background activity gets as high as 7 hertz. There is beta activity observed in the frontal areas. There is normal myogenic interference noted. Photic stimulation and hyperventilation are not conducted. There is no focal or lateral slowing. There is no epileptiform activities noted. There is bifrontal episodic slowing noted. IMPRESSION: Mild global slowing is noted. No epileptiform activities noted. Kofi A. Gerilyn Pilgrim, M.D. Diplomate, Biomedical engineer of Psychiatry and Neurology ( Neurology).   ECHOCARDIOGRAM COMPLETE  Result Date: 06/03/2021    ECHOCARDIOGRAM REPORT   Patient Name:   Joshua Morales Date of Exam: 06/03/2021 Medical Rec #:  [id number]        Height:       73.0 in Accession #:    [id number]       Weight:       138.2 lb Date of Birth:  11-13-1945         BSA:          1.839 m Patient Age:    75 years         BP:           178/82 mmHg Patient Gender: M                HR:           65 bpm. [facility] Procedure: 2D Echo, Cardiac Doppler and Color Doppler Indications:    Stroke  History:        Patient has prior history of Echocardiogram examinations, most  recent 04/06/2021. Stroke; Risk Factors:Hypertension and Diabetes.                 Tobacco abuse, Alcohol abuse, Left atrial appendage thrombus.  Sonographer:    Mikki Harbor Referring  Phys: [id number] Kaniyah Lisby L Maria Coin IMPRESSIONS  1. Left ventricular ejection fraction, by estimation, is 65 to 70%. The left ventricle has normal function. The left ventricle has no regional wall motion abnormalities. There is severe left ventricular hypertrophy. Left ventricular diastolic parameters  are consistent with Grade I diastolic dysfunction (impaired relaxation).  2. Right ventricular systolic function is normal. The right ventricular size is normal. There is normal pulmonary artery systolic pressure.  3. The pericardial effusion is posterior to the left ventricle and anterior to the right ventricle.  4. The mitral valve is abnormal. Trivial mitral valve regurgitation. No evidence of mitral stenosis. The mean mitral valve gradient is 2.0 mmHg.  5. The aortic valve is tricuspid. There is mild thickening of the aortic valve. Aortic valve regurgitation is not visualized. No aortic stenosis is present. Aortic valve mean gradient measures 2.0 mmHg.  6. The inferior vena cava is normal in size with greater than 50% respiratory variability, suggesting right atrial pressure of 3 mmHg. Comparison(s): Prior images reviewed side by side. LVEF is normal with severe LVH. FINDINGS  Left Ventricle: Left ventricular ejection fraction, by estimation, is 65 to 70%. The left ventricle has normal function. The left ventricle has no regional wall motion abnormalities. The left ventricular internal cavity size was normal in size. There is  severe left ventricular hypertrophy. Left ventricular diastolic parameters are consistent with Grade I diastolic dysfunction (impaired relaxation). Right Ventricle: The right ventricular size is normal. No increase in right ventricular wall thickness. Right ventricular systolic function is normal. There is normal pulmonary artery systolic pressure. The tricuspid regurgitant velocity is 2.37 m/s, and  with an assumed right atrial pressure of 3 mmHg, the estimated right ventricular systolic pressure  is 25.5 mmHg. Left Atrium: Left atrial size was normal in size. Right Atrium: Right atrial size was normal in size. Pericardium: Trivial pericardial effusion is present. The pericardial effusion is posterior to the left ventricle and anterior to the right ventricle. Mitral Valve: The mitral valve is abnormal. There is mild thickening of the mitral valve leaflet(s). Trivial mitral valve regurgitation. No evidence of mitral valve stenosis. MV peak gradient, 5.1 mmHg. The mean mitral valve gradient is 2.0 mmHg. Tricuspid Valve: The tricuspid valve is grossly normal. Tricuspid valve regurgitation is mild. Aortic Valve: The aortic valve is tricuspid. There is mild thickening of the aortic valve. Aortic valve regurgitation is not visualized. No aortic stenosis is present. Aortic valve mean gradient measures 2.0 mmHg. Aortic valve peak gradient measures 4.3 mmHg. Aortic valve area, by VTI measures 3.06 cm. Pulmonic Valve: The pulmonic valve was grossly normal. Pulmonic valve regurgitation is trivial. Aorta: The aortic root is normal in size and structure. Venous: The inferior vena cava is normal in size with greater than 50% respiratory variability, suggesting right atrial pressure of 3 mmHg. IAS/Shunts: No atrial level shunt detected by color flow Doppler.  LEFT VENTRICLE PLAX 2D LVIDd:         4.20 cm  Diastology LVIDs:         2.30 cm  LV e' medial:    5.77 cm/s LV PW:         1.60 cm  LV E/e' medial:  10.6 LV IVS:        1.60 cm  LV e' lateral:   6.64 cm/s LVOT diam:     2.10 cm  LV E/e' lateral: 9.2 LV SV:         63 LV SV Index:   34 LVOT Area:     3.46 cm  RIGHT VENTRICLE RV Basal diam:  3.05 cm RV Mid diam:    2.70 cm LEFT ATRIUM           Index       RIGHT ATRIUM           Index LA diam:      2.70 cm 1.47 cm/m  RA Area:     16.70 cm LA Vol (A4C): 47.4 ml 25.77 ml/m RA Volume:   47.50 ml  25.83 ml/m  AORTIC VALVE                   PULMONIC VALVE AV Area (Vmax):    2.55 cm    PV Vmax:       0.79 m/s AV Area  (Vmean):   2.47 cm    PV Peak grad:  2.5 mmHg AV Area (VTI):     3.06 cm AV Vmax:           103.95 cm/s AV Vmean:          67.600 cm/s AV VTI:            0.207 m AV Peak Grad:      4.3 mmHg AV Mean Grad:      2.0 mmHg LVOT Vmax:         76.40 cm/s LVOT Vmean:        48.200 cm/s LVOT VTI:          0.183 m LVOT/AV VTI ratio: 0.88  AORTA Ao Root diam: 3.70 cm MITRAL VALVE               TRICUSPID VALVE MV Area (PHT): 2.93 cm    TR Peak grad:   22.5 mmHg MV Area VTI:   2.12 cm    TR Vmax:        237.00 cm/s MV Peak grad:  5.1 mmHg MV Mean grad:  2.0 mmHg    SHUNTS MV Vmax:       1.13 m/s    Systemic VTI:  0.18 m MV Vmean:      61.4 cm/s   Systemic Diam: 2.10 cm MV Decel Time: 259 msec MV E velocity: 61.30 cm/s MV A velocity: 97.30 cm/s MV E/A ratio:  0.63 [name] MD Electronically signed by [name] MD Signature Date/Time: 06/03/2021/5:27:12 PM    Final      Subjective: Patient sitting up eating with family members.  No specific complaints today.  He is agreeable to going home.  Discharge Exam: Vitals:   06/05/21 0125 06/05/21 0546  BP: 133/74 (!) 164/68  Pulse: 94 78  Resp: 18 16  Temp: 98.3 F (36.8 C) 99 F (37.2 C)  SpO2: 99% 97%   Vitals:   06/04/21 1216 06/04/21 2127 06/05/21 0125 06/05/21 0546  BP: (!) 166/95 124/62 133/74 (!) 164/68  Pulse: 94 76 94 78  Resp: 18 18 18 16   Temp: 98.7 F (37.1 C) 97.6 F (36.4 C) 98.3 F (36.8 C) 99 F (37.2 C)  TempSrc: Oral Oral Oral Oral  SpO2: 100% 100% 99% 97%  Weight:      Height:       General: Pt is alert, awake, not in acute distress Cardiovascular: normal S1/S2 +,  no rubs, no gallops Respiratory: CTA bilaterally, no wheezing, no rhonchi Abdominal: Soft, NT, ND, bowel sounds + Extremities: no edema, no cyanosis Neurological: mild left hemiparesis   The results of significant diagnostics from this hospitalization (including imaging, microbiology, ancillary and laboratory) are listed below for reference.      Microbiology: Recent Results (from the past 240 hour(s))  Resp Panel by RT-PCR (Flu A&B, Covid) Nasopharyngeal Swab     Status: None   Collection Time: 06/02/21  8:44 AM   Specimen: Nasopharyngeal Swab; Nasopharyngeal(NP) swabs in vial transport medium  Result Value Ref Range Status   SARS Coronavirus 2 by RT PCR NEGATIVE NEGATIVE Final    Comment: (NOTE) SARS-CoV-2 target nucleic acids are NOT DETECTED.  The SARS-CoV-2 RNA is generally detectable in upper respiratory specimens during the acute phase of infection. The lowest concentration of SARS-CoV-2 viral copies this assay can detect is 138 copies/mL. A negative result does not preclude SARS-Cov-2 infection and should not be used as the sole basis for treatment or other patient management decisions. A negative result may occur with  improper specimen collection/handling, submission of specimen other than nasopharyngeal swab, presence of viral mutation(s) within the areas targeted by this assay, and inadequate number of viral copies(<138 copies/mL). A negative result must be combined with clinical observations, patient history, and epidemiological information. The expected result is Negative.  Fact Sheet for Patients:  BloggerCourse.com  Fact Sheet for Healthcare Providers:  SeriousBroker.it  This test is no t yet approved or cleared by the [place] FDA and  has been authorized for detection and/or diagnosis of SARS-CoV-2 by FDA under an Emergency Use Authorization (EUA). This EUA will remain  in effect (meaning this test can be used) for the duration of the COVID-19 declaration under Section 564(b)(1) of the Act, 21 U.S.C.section 360bbb-3(b)(1), unless the authorization is terminated  or revoked sooner.       Influenza A by PCR NEGATIVE NEGATIVE Final   Influenza B by PCR NEGATIVE NEGATIVE Final    Comment: (NOTE) The Xpert Xpress SARS-CoV-2/FLU/RSV plus assay is  intended as an aid in the diagnosis of influenza from Nasopharyngeal swab specimens and should not be used as a sole basis for treatment. Nasal washings and aspirates are unacceptable for Xpert Xpress SARS-CoV-2/FLU/RSV testing.  Fact Sheet for Patients: BloggerCourse.com  Fact Sheet for Healthcare Providers: SeriousBroker.it  This test is not yet approved or cleared by the [place] FDA and has been authorized for detection and/or diagnosis of SARS-CoV-2 by FDA under an Emergency Use Authorization (EUA). This EUA will remain in effect (meaning this test can be used) for the duration of the COVID-19 declaration under Section 564(b)(1) of the Act, 21 U.S.C. section 360bbb-3(b)(1), unless the authorization is terminated or revoked.  Performed at [facility], [address] [place], [place] [address]   Culture, blood (routine x 2)     Status: None (Preliminary result)   Collection Time: 06/02/21  9:07 AM   Specimen: Right Antecubital; Blood  Result Value Ref Range Status   Specimen Description RIGHT ANTECUBITAL  Final   Special Requests   Final    BOTTLES DRAWN AEROBIC AND ANAEROBIC Blood Culture adequate volume   Culture   Final    NO GROWTH 3 DAYS Performed at [facility], [address] [place], [place] [address]    Report Status PENDING  Incomplete  Culture, blood (routine x 2)     Status: None (Preliminary result)   Collection Time: 06/02/21  9:26 AM  Specimen: Left Antecubital; Blood  Result Value Ref Range Status   Specimen Description LEFT ANTECUBITAL  Final   Special Requests   Final    BOTTLES DRAWN AEROBIC AND ANAEROBIC Blood Culture adequate volume   Culture   Final    NO GROWTH 3 DAYS Performed at [facility], [address], [place], [place] [address]    Report Status PENDING  Incomplete     Labs: BNP (last 3 results) No results for input(s): BNP in the last 8760 hours. Basic Metabolic Panel: Recent  Labs  Lab 06/02/21 0907 06/03/21 0526 06/04/21 0545 06/05/21 0533  NA 136 138 137 138  K 5.0 4.8 5.0 4.1  CL 104 104 101 106  CO2 23 25 26 25   GLUCOSE 277* 136* 232* 93  BUN 31* 31* 32* 33*  CREATININE 1.92* 2.01* 2.01* 2.04*  CALCIUM 8.9 9.2 9.3 8.7*  MG  --  1.9 1.7 1.9   Liver Function Tests: Recent Labs  Lab 06/02/21 0907  AST 26  ALT 35  ALKPHOS 50  BILITOT 0.8  PROT 6.8  ALBUMIN 3.7   No results for input(s): LIPASE, AMYLASE in the last 168 hours. No results for input(s): AMMONIA in the last 168 hours. CBC: Recent Labs  Lab 06/02/21 0907 06/04/21 0545 06/05/21 0533  WBC 11.5* 15.5* 12.0*  NEUTROABS 8.5*  --   --   HGB 8.8* 9.6* 8.0*  HCT 28.2* 29.3* 25.0*  MCV 105.2* 103.2* 101.2*  PLT 164 169 139*   Cardiac Enzymes: No results for input(s): CKTOTAL, CKMB, CKMBINDEX, TROPONINI in the last 168 hours. BNP: Invalid input(s): POCBNP CBG: Recent Labs  Lab 06/04/21 1631 06/04/21 2138 06/05/21 0327 06/05/21 0727 06/05/21 1103  GLUCAP 116* 100* 82 108* 251*   D-Dimer No results for input(s): DDIMER in the last 72 hours. Hgb A1c No results for input(s): HGBA1C in the last 72 hours. Lipid Profile Recent Labs    06/03/21 0526  CHOL 148  HDL 55  LDLCALC 81  TRIG 60  CHOLHDL 2.7   Thyroid function studies No results for input(s): TSH, T4TOTAL, T3FREE, THYROIDAB in the last 72 hours.  Invalid input(s): FREET3 Anemia work up No results for input(s): VITAMINB12, FOLATE, FERRITIN, TIBC, IRON, RETICCTPCT in the last 72 hours. Urinalysis    Component Value Date/Time   COLORURINE YELLOW 06/02/2021 0839   APPEARANCEUR CLEAR 06/02/2021 0839   LABSPEC 1.014 06/02/2021 0839   PHURINE 5.0 06/02/2021 0839   GLUCOSEU 150 (A) 06/02/2021 0839   HGBUR MODERATE (A) 06/02/2021 0839   BILIRUBINUR NEGATIVE 06/02/2021 0839   KETONESUR NEGATIVE 06/02/2021 0839   PROTEINUR >=300 (A) 06/02/2021 0839   NITRITE NEGATIVE 06/02/2021 0839   LEUKOCYTESUR NEGATIVE  06/02/2021 0839   Sepsis Labs Invalid input(s): PROCALCITONIN,  WBC,  LACTICIDVEN Microbiology Recent Results (from the past 240 hour(s))  Resp Panel by RT-PCR (Flu A&B, Covid) Nasopharyngeal Swab     Status: None   Collection Time: 06/02/21  8:44 AM   Specimen: Nasopharyngeal Swab; Nasopharyngeal(NP) swabs in vial transport medium  Result Value Ref Range Status   SARS Coronavirus 2 by RT PCR NEGATIVE NEGATIVE Final    Comment: (NOTE) SARS-CoV-2 target nucleic acids are NOT DETECTED.  The SARS-CoV-2 RNA is generally detectable in upper respiratory specimens during the acute phase of infection. The lowest concentration of SARS-CoV-2 viral copies this assay can detect is 138 copies/mL. A negative result does not preclude SARS-Cov-2 infection and should not be used as the sole basis for treatment or  other patient management decisions. A negative result may occur with  improper specimen collection/handling, submission of specimen other than nasopharyngeal swab, presence of viral mutation(s) within the areas targeted by this assay, and inadequate number of viral copies(<138 copies/mL). A negative result must be combined with clinical observations, patient history, and epidemiological information. The expected result is Negative.  Fact Sheet for Patients:  BloggerCourse.com  Fact Sheet for Healthcare Providers:  SeriousBroker.it  This test is no t yet approved or cleared by the [place] FDA and  has been authorized for detection and/or diagnosis of SARS-CoV-2 by FDA under an Emergency Use Authorization (EUA). This EUA will remain  in effect (meaning this test can be used) for the duration of the COVID-19 declaration under Section 564(b)(1) of the Act, 21 U.S.C.section 360bbb-3(b)(1), unless the authorization is terminated  or revoked sooner.       Influenza A by PCR NEGATIVE NEGATIVE Final   Influenza B by PCR NEGATIVE  NEGATIVE Final    Comment: (NOTE) The Xpert Xpress SARS-CoV-2/FLU/RSV plus assay is intended as an aid in the diagnosis of influenza from Nasopharyngeal swab specimens and should not be used as a sole basis for treatment. Nasal washings and aspirates are unacceptable for Xpert Xpress SARS-CoV-2/FLU/RSV testing.  Fact Sheet for Patients: BloggerCourse.com  Fact Sheet for Healthcare Providers: SeriousBroker.it  This test is not yet approved or cleared by the [place] FDA and has been authorized for detection and/or diagnosis of SARS-CoV-2 by FDA under an Emergency Use Authorization (EUA). This EUA will remain in effect (meaning this test can be used) for the duration of the COVID-19 declaration under Section 564(b)(1) of the Act, 21 U.S.C. section 360bbb-3(b)(1), unless the authorization is terminated or revoked.  Performed at [facility], [address], [place], [place] [address]   Culture, blood (routine x 2)     Status: None (Preliminary result)   Collection Time: 06/02/21  9:07 AM   Specimen: Right Antecubital; Blood  Result Value Ref Range Status   Specimen Description RIGHT ANTECUBITAL  Final   Special Requests   Final    BOTTLES DRAWN AEROBIC AND ANAEROBIC Blood Culture adequate volume   Culture   Final    NO GROWTH 3 DAYS Performed at [facility], [address] [place], [place] [address]    Report Status PENDING  Incomplete  Culture, blood (routine x 2)     Status: None (Preliminary result)   Collection Time: 06/02/21  9:26 AM   Specimen: Left Antecubital; Blood  Result Value Ref Range Status   Specimen Description LEFT ANTECUBITAL  Final   Special Requests   Final    BOTTLES DRAWN AEROBIC AND ANAEROBIC Blood Culture adequate volume   Culture   Final    NO GROWTH 3 DAYS Performed at [facility], [address], [place], [place] [address]    Report Status PENDING  Incomplete   Time coordinating discharge:  35 mins   SIGNED:  Standley Dakins, MD  [facility] 06/05/2021, 12:02 PM How to contact the [facility] Attending or Consulting provider 7A - 7P or covering provider during after hours 7P -7A, for this patient?  Check the care team in [facility] and look for a) attending/consulting [facility] provider listed and b) the [facility] team listed Log into [email or web address] and use [place]'s universal password to access. If you do not have the password, please contact the hospital operator. Locate the [facility] provider you are looking for under [facility] and page to a number that  you can be directly reached. If you still have difficulty reaching the provider, please page the [facility] (Director on Call) for the Hospitalists listed on amion for assistance.

## 2021-06-05 NOTE — Plan of Care (Signed)
  Problem: Education: Goal: Knowledge of General Education information will improve Description: Including pain rating scale, medication(s)/side effects and non-pharmacologic comfort measures Outcome: Adequate for Discharge   Problem: Health Behavior/Discharge Planning: Goal: Ability to manage health-related needs will improve Outcome: Adequate for Discharge   Problem: Clinical Measurements: Goal: Ability to maintain clinical measurements within normal limits will improve Outcome: Adequate for Discharge Goal: Will remain free from infection Outcome: Adequate for Discharge Goal: Diagnostic test results will improve Outcome: Adequate for Discharge Goal: Respiratory complications will improve Outcome: Adequate for Discharge Goal: Cardiovascular complication will be avoided Outcome: Adequate for Discharge   Problem: Activity: Goal: Risk for activity intolerance will decrease Outcome: Adequate for Discharge   Problem: Nutrition: Goal: Adequate nutrition will be maintained Outcome: Adequate for Discharge   Problem: Coping: Goal: Level of anxiety will decrease Outcome: Adequate for Discharge   Problem: Elimination: Goal: Will not experience complications related to bowel motility Outcome: Adequate for Discharge Goal: Will not experience complications related to urinary retention Outcome: Adequate for Discharge   Problem: Pain Managment: Goal: General experience of comfort will improve Outcome: Adequate for Discharge   Problem: Safety: Goal: Ability to remain free from injury will improve Outcome: Adequate for Discharge   Problem: Skin Integrity: Goal: Risk for impaired skin integrity will decrease Outcome: Adequate for Discharge   Problem: Education: Goal: Knowledge of secondary prevention will improve Outcome: Adequate for Discharge Goal: Knowledge of patient specific risk factors addressed and post discharge goals established will improve Outcome: Adequate for  Discharge Goal: Individualized Educational Video(s) Outcome: Adequate for Discharge   

## 2021-06-07 LAB — CULTURE, BLOOD (ROUTINE X 2)
Culture: NO GROWTH
Culture: NO GROWTH
Special Requests: ADEQUATE
Special Requests: ADEQUATE

## 2021-07-13 ENCOUNTER — Other Ambulatory Visit
Admission: RE | Admit: 2021-07-13 | Discharge: 2021-07-13 | Disposition: A | Discharge status: Home / Self Care | Payer: Medicare HMO | Source: Ambulatory Visit | Attending: Family Medicine | Admitting: Family Medicine

## 2021-07-13 DIAGNOSIS — Z7901 Long term (current) use of anticoagulants: Secondary | ICD-10-CM | POA: Diagnosis present

## 2021-07-13 LAB — PROTIME-INR
INR: 1.3 — ABNORMAL HIGH (ref 0.8–1.2)
Prothrombin Time: 16 seconds — ABNORMAL HIGH (ref 11.4–15.2)

## 2021-08-05 ENCOUNTER — Encounter: Payer: Self-pay | Admitting: Emergency Medicine

## 2021-08-05 DIAGNOSIS — I129 Hypertensive chronic kidney disease with stage 1 through stage 4 chronic kidney disease, or unspecified chronic kidney disease: Secondary | ICD-10-CM | POA: Insufficient documentation

## 2021-08-05 DIAGNOSIS — N1832 Chronic kidney disease, stage 3b: Secondary | ICD-10-CM | POA: Diagnosis not present

## 2021-08-05 DIAGNOSIS — I2510 Atherosclerotic heart disease of native coronary artery without angina pectoris: Secondary | ICD-10-CM | POA: Diagnosis not present

## 2021-08-05 DIAGNOSIS — F0390 Unspecified dementia without behavioral disturbance: Secondary | ICD-10-CM | POA: Insufficient documentation

## 2021-08-05 DIAGNOSIS — N179 Acute kidney failure, unspecified: Secondary | ICD-10-CM | POA: Insufficient documentation

## 2021-08-05 DIAGNOSIS — Z794 Long term (current) use of insulin: Secondary | ICD-10-CM | POA: Insufficient documentation

## 2021-08-05 DIAGNOSIS — D649 Anemia, unspecified: Secondary | ICD-10-CM | POA: Diagnosis not present

## 2021-08-05 DIAGNOSIS — U071 COVID-19: Secondary | ICD-10-CM | POA: Insufficient documentation

## 2021-08-05 DIAGNOSIS — E860 Dehydration: Secondary | ICD-10-CM | POA: Insufficient documentation

## 2021-08-05 DIAGNOSIS — E1122 Type 2 diabetes mellitus with diabetic chronic kidney disease: Secondary | ICD-10-CM | POA: Insufficient documentation

## 2021-08-05 DIAGNOSIS — F17210 Nicotine dependence, cigarettes, uncomplicated: Secondary | ICD-10-CM | POA: Diagnosis not present

## 2021-08-05 DIAGNOSIS — Z79899 Other long term (current) drug therapy: Secondary | ICD-10-CM | POA: Insufficient documentation

## 2021-08-05 LAB — CBC
HCT: 22.8 % — ABNORMAL LOW (ref 39.0–52.0)
Hemoglobin: 7.2 g/dL — ABNORMAL LOW (ref 13.0–17.0)
MCH: 31.6 pg (ref 26.0–34.0)
MCHC: 31.6 g/dL (ref 30.0–36.0)
MCV: 100 fL (ref 80.0–100.0)
Platelets: 221 10*3/uL (ref 150–400)
RBC: 2.28 MIL/uL — ABNORMAL LOW (ref 4.22–5.81)
RDW: 15.3 % (ref 11.5–15.5)
WBC: 10.2 10*3/uL (ref 4.0–10.5)
nRBC: 0 % (ref 0.0–0.2)

## 2021-08-05 LAB — BASIC METABOLIC PANEL
Anion gap: 6 (ref 5–15)
BUN: 61 mg/dL — ABNORMAL HIGH (ref 8–23)
CO2: 23 mmol/L (ref 22–32)
Calcium: 8.6 mg/dL — ABNORMAL LOW (ref 8.9–10.3)
Chloride: 107 mmol/L (ref 98–111)
Creatinine, Ser: 2.8 mg/dL — ABNORMAL HIGH (ref 0.61–1.24)
GFR, Estimated: 23 mL/min — ABNORMAL LOW (ref >60)
Glucose, Bld: 182 mg/dL — ABNORMAL HIGH (ref 70–99)
Potassium: 4.6 mmol/L (ref 3.5–5.1)
Sodium: 136 mmol/L (ref 135–145)

## 2021-08-05 LAB — RESP PANEL BY RT-PCR (FLU A&B, COVID) ARPGX2
Influenza A by PCR: NEGATIVE
Influenza B by PCR: NEGATIVE
SARS Coronavirus 2 by RT PCR: POSITIVE — AB

## 2021-08-05 NOTE — ED Triage Notes (Signed)
Dx with COVID today

## 2021-08-05 NOTE — ED Triage Notes (Signed)
[facility] called to inform staff that family requested pt to be transported to ED for evaluation.  Pt had no complaints at [facility].

## 2021-08-05 NOTE — ED Triage Notes (Signed)
Pt in from [facility], via [facility] per family request. Tested positive today for Covid, and had a fever of 101 PTA. This RN called daughter during triage, and she states with his medical hx, they want to get him checked out early, as he has had a stroke, has DM, and early dementia. VSS in triage. Pt appears lethargic, a&ox3

## 2021-08-06 ENCOUNTER — Emergency Department
Admission: EM | Admit: 2021-08-06 | Discharge: 2021-08-06 | Disposition: A | Discharge status: Home / Self Care | Payer: No Typology Code available for payment source | Attending: Emergency Medicine | Admitting: Emergency Medicine

## 2021-08-06 ENCOUNTER — Emergency Department: Payer: No Typology Code available for payment source

## 2021-08-06 DIAGNOSIS — N179 Acute kidney failure, unspecified: Secondary | ICD-10-CM

## 2021-08-06 DIAGNOSIS — U071 COVID-19: Secondary | ICD-10-CM

## 2021-08-06 DIAGNOSIS — R739 Hyperglycemia, unspecified: Secondary | ICD-10-CM

## 2021-08-06 LAB — BASIC METABOLIC PANEL
Anion gap: 5 (ref 5–15)
BUN: 57 mg/dL — ABNORMAL HIGH (ref 8–23)
CO2: 22 mmol/L (ref 22–32)
Calcium: 8.6 mg/dL — ABNORMAL LOW (ref 8.9–10.3)
Chloride: 109 mmol/L (ref 98–111)
Creatinine, Ser: 2.57 mg/dL — ABNORMAL HIGH (ref 0.61–1.24)
GFR, Estimated: 26 mL/min — ABNORMAL LOW (ref >60)
Glucose, Bld: 410 mg/dL — ABNORMAL HIGH (ref 70–99)
Potassium: 4.8 mmol/L (ref 3.5–5.1)
Sodium: 136 mmol/L (ref 135–145)

## 2021-08-06 LAB — CBG MONITORING, ED: Glucose-Capillary: 367 mg/dL — ABNORMAL HIGH (ref 70–99)

## 2021-08-06 LAB — PROTIME-INR
INR: 1.9 — ABNORMAL HIGH (ref 0.8–1.2)
Prothrombin Time: 21.5 seconds — ABNORMAL HIGH (ref 11.4–15.2)

## 2021-08-06 MED ORDER — AMLODIPINE BESYLATE 5 MG PO TABS
10.0000 mg | ORAL_TABLET | Freq: Every day | ORAL | Status: DC
  Administered 2021-08-06: 10 mg via ORAL
  Filled 2021-08-06: qty 2

## 2021-08-06 MED ORDER — LACTATED RINGERS IV BOLUS
1000.0000 mL | Freq: Once | INTRAVENOUS | Status: AC
  Administered 2021-08-06: 1000 mL via INTRAVENOUS

## 2021-08-06 MED ORDER — INSULIN ASPART 100 UNIT/ML IJ SOLN
0.0000 [IU] | INTRAMUSCULAR | Status: DC
Start: 2021-08-06 — End: 2021-08-06
  Administered 2021-08-06: 15 [IU] via SUBCUTANEOUS
  Filled 2021-08-06: qty 1

## 2021-08-06 NOTE — ED Notes (Addendum)
Pt brief changed from BM and urine incontinence. Peri care provided. Repositioned in bed

## 2021-08-06 NOTE — ED Provider Notes (Signed)
I assumed care of this patient approximately 0 730.  Please see off coronavirus note for full details regarding patient's initial evaluation assessment.  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   Patient reportedly has no complaints and is baseline neurologically.  Chest x-ray not suggestive of bacterial pneumonia or other clear acute thoracic process but shows some chronic changes.  BMP remarkable for evidence of an AKI on CKD with a creatinine of 2.8 compared to 2.04 without other significant electrolyte or metabolic derangements.  COVID PCR confirmed positive in the ED today.  CBC without leukocytosis and baseline anemia.  Plan is to give some IV fluids and recheck BMP and INR.  Following IV fluids BMP shows provement of kidney function with creatinine at 2.57.  Patient's glucose has slightly increased to 410 without evidence of acidosis or development of any other electrolyte derangements.  He is a diabetic.  Will cover with sliding scale insulin pending INR which if it is within normal limits plan is for discharge back to facility.  Patient's GFR fortunately precludes him from Paxlovid at this time.  Updated daughter and patient on work-up.  INR is 1.9.  Advised to have INR, hemoglobin, kidney function and glucose rechecked in 2 or 3 days.  Daughter is amenable to this.  Patient is denying any acute complaints on my reassessment.  Discharged in stable condition.   Gilles Chiquito, MD 08/06/21 [phone]

## 2021-08-06 NOTE — ED Provider Notes (Signed)
[facility] Emergency Department Provider Note   ____________________________________________   Event Date/Time   First MD Initiated Contact with Patient 08/06/21 [phone]     (approximate)  I have reviewed the triage vital signs and the nursing notes.   HISTORY  Chief Complaint Covid Positive and Weakness    HPI KARLE MASSE is a 75 y.o. male with history of hypertension, diabetes, CKD, atrial thrombus on Coumadin, stroke, and dementia who presents to the ED for COVID-19.  History is limited due to patient's baseline dementia.  Per staff at [facility], family have requested that patient be evaluated in the ED today after testing positive for COVID-19 yesterday.  He did not have any complaints to staff at [facility] and is reportedly at his baseline mental status.  Patient denies any complaints here to the ED states "I am fine."  Has any pain in his chest or difficulty breathing, has not had any vomiting or diarrhea.  Patient is bedbound at baseline.        Past Medical History:  Diagnosis Date   Agent orange exposure    Atrial thrombus 05/05/2021   CRD (chronic renal disease), stage 3b ([place]) 05/05/2021   Dementia ([place])    Diabetes mellitus without complication ([place])    Hypertension    Macrocytic anemia 05/05/2021    Patient Active Problem List   Diagnosis Date Noted   Elevated troponin    Acute ischemic stroke ([place]) 05/25/2021   Acute renal failure superimposed on stage 3b chronic kidney disease ([place]) 05/25/2021   Right sided weakness 05/24/2021   Enteritis, enterotoxigenic E. 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 Authorizing Provider  ACIDOPHILUS LACTOBACILLUS PO Take 2 capsules by mouth in the morning and at bedtime.    [provider]  amLODipine (NORVASC) 10 MG tablet Take 1 tablet (10 mg total) by mouth daily. 06/06/21   Johnson, Clanford L, MD  atenolol (TENORMIN) 25 MG tablet Take 1 tablet (25 mg total) by mouth daily. 04/08/21   Johnson, Clanford L, MD  atorvastatin (LIPITOR) 80 MG tablet Take 1 tablet (80 mg total) by mouth every evening. 06/05/21   Johnson, Clanford L, MD  Cholecalciferol (D3-1000) 25 MCG (1000 UT) tablet Take 2,000 Units by mouth daily.    [provider]  donepezil (ARICEPT) 5 MG tablet Take 1 tablet (5 mg total) by mouth at  bedtime. 04/08/21   Johnson, Clanford L, MD  enoxaparin (LOVENOX) 60 MG/0.6ML injection Inject 0.6 mLs (60 mg total) into the skin daily for 7 days. STOP ENOXAPARIN WHEN INR >2 06/06/21 06/13/21  Cleora Fleet, MD  folic acid (FOLVITE) 1 MG tablet Take 1 tablet (1 mg total) by mouth daily. 04/08/21   Johnson, Clanford L, MD  hydrALAZINE (APRESOLINE) 25 MG tablet Take 1 tablet (25 mg total) by mouth 3 (three) times daily. 06/05/21   Johnson, Clanford L, MD   hydrOXYzine (ATARAX/VISTARIL) 10 MG tablet Take 10 mg by mouth 2 (two) times daily as needed for itching.    [provider]  insulin aspart (NOVOLOG) 100 UNIT/ML FlexPen Inject 4 Units into the skin 3 (three) times daily with meals. IF EATS 50% OR MORE OF MEAL. 06/05/21   Johnson, Clanford L, MD  insulin detemir (LEVEMIR) 100 UNIT/ML FlexPen Inject 8 Units into the skin daily.    [provider]  Insulin Pen Needle 31G X 5 MM MISC 1 Device by Does not apply route as directed. 04/08/21   Johnson, Clanford L, MD  memantine (NAMENDA) 5 MG tablet Take 1 tablet (5 mg total) by mouth 2 (two) times daily. 04/08/21   Johnson, Clanford L, MD  mirtazapine (REMERON) 15 MG tablet Take 15 mg by mouth at bedtime. 08/07/20   [provider]  Multiple Vitamin (MULTIVITAMIN WITH MINERALS) TABS tablet Take 1 tablet by mouth daily. 04/08/21   Johnson, Clanford L, MD  QUEtiapine (SEROQUEL) 25 MG tablet Take 1 tablet (25 mg total) by mouth at bedtime. 04/08/21   Johnson, Clanford L, MD  thiamine 100 MG tablet Take 1 tablet (100 mg total) by mouth daily. 04/08/21   Johnson, Clanford L, MD  warfarin (COUMADIN) 5 MG tablet Take 1 tablet (5 mg total) by mouth daily at 4 PM. OR AS DIRECTED BY PHYSICIAN 06/05/21   Cleora Fleet, MD    Allergies Patient has no known allergies.  Family History  Problem Relation Age of Onset   Congestive Heart Failure Mother    Diabetes Mellitus I Mother    Dementia Father    Throat cancer Brother     Social History Social History   Tobacco Use   Smoking status: Every Day    Types: Cigarettes  Vaping Use   Vaping Use: Never used  Substance Use Topics   Alcohol use: Yes    Comment: occ   Drug use: Never    Review of Systems Unable to obtain secondary to dementia  ____________________________________________   PHYSICAL EXAM:  VITAL SIGNS: ED Triage Vitals [08/05/21 2122]  Enc Vitals Group     BP (!) 118/59     Pulse Rate 90     Resp 18      Temp 100.3 F (37.9 C)     Temp Source Oral     SpO2 96 %     Weight      Height      Head Circumference      Peak Flow      Pain Score      Pain Loc      Pain Edu?      Excl. in GC?     Constitutional: Alert and oriented to person, but not place or time. Eyes: Conjunctivae are normal. Head: Atraumatic. Nose: No congestion/rhinnorhea. Mouth/Throat: Mucous membranes are moist. Neck: Normal ROM Cardiovascular: Normal rate, regular rhythm. Grossly normal heart sounds.  2+ radial pulses bilaterally. Respiratory: Normal respiratory  effort.  No retractions. Lungs CTAB. Gastrointestinal: Soft and nontender. No distention. Genitourinary: deferred Musculoskeletal: No lower extremity tenderness nor edema. Neurologic:  Normal speech and language. No gross focal neurologic deficits are appreciated. Skin:  Skin is warm, dry and intact. No rash noted. Psychiatric: Mood and affect are normal. Speech and behavior are normal.  ____________________________________________   LABS (all labs ordered are listed, but only abnormal results are displayed)  Labs Reviewed  RESP PANEL BY RT-PCR (FLU A&B, COVID) ARPGX2 - Abnormal; Notable for the following components:      Result Value   SARS Coronavirus 2 by RT PCR POSITIVE (*)    All other components within normal limits  BASIC METABOLIC PANEL - Abnormal; Notable for the following components:   Glucose, Bld 182 (*)    BUN 61 (*)    Creatinine, Ser 2.80 (*)    Calcium 8.6 (*)    GFR, Estimated 23 (*)    All other components within normal limits  CBC - Abnormal; Notable for the following components:   RBC 2.28 (*)    Hemoglobin 7.2 (*)    HCT 22.8 (*)    All other components within normal limits  BASIC METABOLIC PANEL - Abnormal; Notable for the following components:   Glucose, Bld 410 (*)    BUN 57 (*)    Creatinine, Ser 2.57 (*)    Calcium 8.6 (*)    GFR, Estimated 26 (*)    All other components within normal limits  URINALYSIS,  ROUTINE W REFLEX MICROSCOPIC  PROTIME-INR  CBG MONITORING, ED   ____________________________________________  EKG  ED ECG REPORT I, Blake Divine, the attending physician, personally viewed and interpreted this ECG.   Date: 08/06/2021  EKG Time: 21:27  Rate: 88  Rhythm: normal sinus rhythm, occasional PVC  Axis: Normal  Intervals:none  ST&T Change: None    PROCEDURES  Procedure(s) performed (including Critical Care):  Procedures   ____________________________________________   INITIAL IMPRESSION / ASSESSMENT AND PLAN / ED COURSE      75 year old male with possible history of hypertension, diabetes, CAD, atrial thrombus on Coumadin, stroke, and dementia who presents to the ED for evaluation at the request of family after he tested positive for COVID-19 yesterday.  Patient is not in any respiratory distress and is maintaining O2 sats on room air.  He denies any complaints and is reportedly at his baseline mental status.  EKG shows no evidence of arrhythmia or ischemia.  Labs unremarkable for chronic anemia, slightly lower than previous but no signs of acute bleeding.  Positive for vomiting and stool is guaiac negative.  Patient noted to have mild AKI, likely due to dehydration and we will give IV fluid bolus.  We will add on INR, also screen chest x-ray.  Chest x-ray reviewed by me and shows no infiltrate, edema, or effusion.  Creatinine is improving following IV fluid bolus and patient would be appropriate for discharge back to nursing facility as long as INR is unremarkable.  Patient turned over to oncoming provider pending INR results.      ____________________________________________   FINAL CLINICAL IMPRESSION(S) / ED DIAGNOSES  Final diagnoses:  COVID-19  AKI (acute kidney injury) ([place])  Hyperglycemia     ED Discharge Orders     None        Note:  This document was prepared using Dragon voice recognition software and may include unintentional  dictation errors.    Blake Divine, MD 08/06/21 [phone]

## 2021-08-06 NOTE — ED Notes (Signed)
Report given to [place] at [facility]. Daughter informed of d/c by Dr Katrinka Blazing

## 2021-09-05 DEATH — deceased

## 2023-04-13 IMAGING — DX DG CHEST 1V PORT
1 series · 1 of 1 positions shown · non-contrast
Comparison: 06/02/2021

CLINICAL DATA: Weakness.

EXAM:
PORTABLE CHEST 1 VIEW

[chest ap]
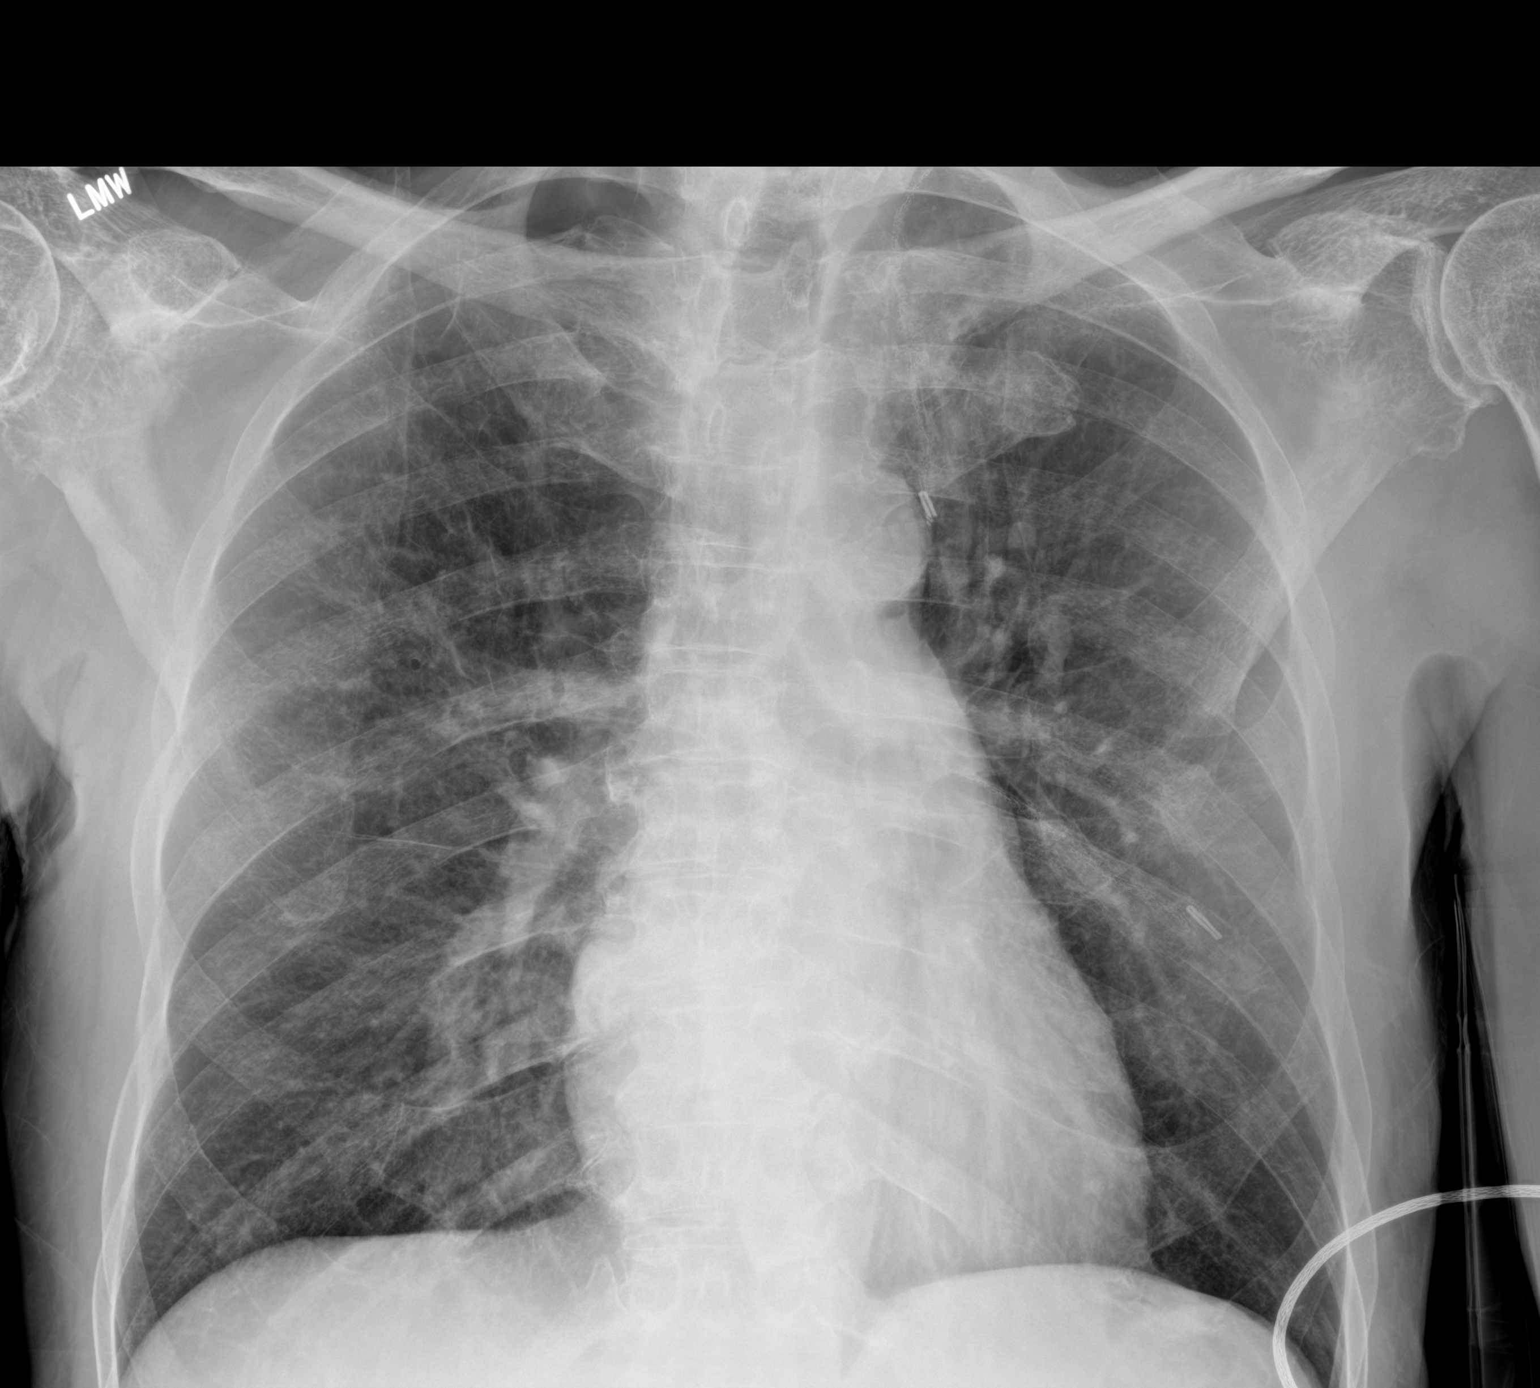

[1 of 1 positions shown; findings below may reference images not displayed]

FINDINGS: 8383 hours. Lungs are hyperexpanded. Architectural distortion and
chronic interstitial changes again noted bilaterally. The
cardiopericardial silhouette is within normal limits for size. The
visualized bony structures of the thorax show no acute abnormality.
IMPRESSION: Chronic lung disease without acute cardiopulmonary process.
